# Patient Record
Sex: Female | Born: 1950
Health system: Southern US, Community
[De-identification: ages and names within clinical notes are randomized; demographics above are authoritative.]

## PROBLEM LIST (undated history)

## (undated) DIAGNOSIS — G9050 Complex regional pain syndrome I, unspecified: Secondary | ICD-10-CM

## (undated) DIAGNOSIS — Z803 Family history of malignant neoplasm of breast: Secondary | ICD-10-CM

## (undated) DIAGNOSIS — Z800 Family history of malignant neoplasm of digestive organs: Secondary | ICD-10-CM

## (undated) DIAGNOSIS — T7840XA Allergy, unspecified, initial encounter: Secondary | ICD-10-CM

## (undated) DIAGNOSIS — IMO0002 Reserved for concepts with insufficient information to code with codable children: Secondary | ICD-10-CM

## (undated) DIAGNOSIS — M1990 Unspecified osteoarthritis, unspecified site: Secondary | ICD-10-CM

## (undated) DIAGNOSIS — I10 Essential (primary) hypertension: Secondary | ICD-10-CM

## (undated) DIAGNOSIS — Z8041 Family history of malignant neoplasm of ovary: Secondary | ICD-10-CM

## (undated) DIAGNOSIS — K317 Polyp of stomach and duodenum: Secondary | ICD-10-CM

## (undated) DIAGNOSIS — E739 Lactose intolerance, unspecified: Secondary | ICD-10-CM

## (undated) DIAGNOSIS — E119 Type 2 diabetes mellitus without complications: Secondary | ICD-10-CM

## (undated) DIAGNOSIS — E039 Hypothyroidism, unspecified: Secondary | ICD-10-CM

## (undated) DIAGNOSIS — K219 Gastro-esophageal reflux disease without esophagitis: Secondary | ICD-10-CM

## (undated) DIAGNOSIS — N200 Calculus of kidney: Secondary | ICD-10-CM

## (undated) DIAGNOSIS — Z1371 Encounter for nonprocreative screening for genetic disease carrier status: Secondary | ICD-10-CM

## (undated) DIAGNOSIS — K449 Diaphragmatic hernia without obstruction or gangrene: Secondary | ICD-10-CM

## (undated) DIAGNOSIS — K3184 Gastroparesis: Secondary | ICD-10-CM

## (undated) DIAGNOSIS — Q359 Cleft palate, unspecified: Secondary | ICD-10-CM

## (undated) DIAGNOSIS — G5600 Carpal tunnel syndrome, unspecified upper limb: Secondary | ICD-10-CM

## (undated) DIAGNOSIS — K589 Irritable bowel syndrome without diarrhea: Secondary | ICD-10-CM

## (undated) HISTORY — DX: Family history of malignant neoplasm of breast: Z80.3

## (undated) HISTORY — DX: Encounter for nonprocreative screening for genetic disease carrier status: Z13.71

## (undated) HISTORY — DX: Cleft palate, unspecified: Q35.9

## (undated) HISTORY — DX: Family history of malignant neoplasm of digestive organs: Z80.0

## (undated) HISTORY — DX: Diaphragmatic hernia without obstruction or gangrene: K44.9

## (undated) HISTORY — DX: Lactose intolerance, unspecified: E73.9

## (undated) HISTORY — DX: Carpal tunnel syndrome, unspecified upper limb: G56.00

## (undated) HISTORY — DX: Calculus of kidney: N20.0

## (undated) HISTORY — DX: Allergy, unspecified, initial encounter: T78.40XA

## (undated) HISTORY — DX: Complex regional pain syndrome I, unspecified: G90.50

## (undated) HISTORY — DX: Irritable bowel syndrome, unspecified: K58.9

## (undated) HISTORY — DX: Unspecified osteoarthritis, unspecified site: M19.90

## (undated) HISTORY — DX: Reserved for concepts with insufficient information to code with codable children: IMO0002

## (undated) HISTORY — DX: Family history of malignant neoplasm of ovary: Z80.41

## (undated) HISTORY — PX: BREAST EXCISIONAL BIOPSY: SUR124

## (undated) HISTORY — PX: INCISIONAL BREAST BIOPSY: SHX1812

## (undated) HISTORY — DX: Polyp of stomach and duodenum: K31.7

## (undated) HISTORY — PX: COLONOSCOPY: SHX174

## (undated) HISTORY — DX: Gastro-esophageal reflux disease without esophagitis: K21.9

---

## 1974-05-05 HISTORY — PX: KNEE SURGERY: SHX244

## 1985-05-05 HISTORY — PX: TUBAL LIGATION: SHX77

## 1998-09-03 ENCOUNTER — Other Ambulatory Visit: Admission: RE | Admit: 1998-09-03 | Discharge: 1998-09-03 | Payer: Self-pay | Admitting: Obstetrics and Gynecology

## 1998-09-05 ENCOUNTER — Other Ambulatory Visit: Admission: RE | Admit: 1998-09-05 | Discharge: 1998-09-05 | Payer: Self-pay | Admitting: Obstetrics and Gynecology

## 1998-12-25 ENCOUNTER — Encounter: Admission: RE | Admit: 1998-12-25 | Discharge: 1999-03-25 | Payer: Self-pay | Admitting: Obstetrics & Gynecology

## 1999-09-26 ENCOUNTER — Encounter: Payer: Self-pay | Admitting: Obstetrics and Gynecology

## 1999-09-26 ENCOUNTER — Encounter: Admission: RE | Admit: 1999-09-26 | Discharge: 1999-09-26 | Payer: Self-pay | Admitting: Obstetrics and Gynecology

## 1999-12-10 ENCOUNTER — Other Ambulatory Visit: Admission: RE | Admit: 1999-12-10 | Discharge: 1999-12-10 | Payer: Self-pay | Admitting: Obstetrics and Gynecology

## 1999-12-25 ENCOUNTER — Encounter: Admission: RE | Admit: 1999-12-25 | Discharge: 2000-02-11 | Payer: Self-pay | Admitting: Family Medicine

## 2000-01-27 ENCOUNTER — Encounter: Payer: Self-pay | Admitting: Obstetrics and Gynecology

## 2000-01-27 ENCOUNTER — Ambulatory Visit (HOSPITAL_COMMUNITY): Admission: RE | Admit: 2000-01-27 | Discharge: 2000-01-27 | Payer: Self-pay | Admitting: Obstetrics and Gynecology

## 2000-05-22 ENCOUNTER — Other Ambulatory Visit: Admission: RE | Admit: 2000-05-22 | Discharge: 2000-05-22 | Payer: Self-pay | Admitting: Obstetrics and Gynecology

## 2000-10-16 ENCOUNTER — Encounter: Payer: Self-pay | Admitting: Obstetrics and Gynecology

## 2000-10-16 ENCOUNTER — Encounter: Admission: RE | Admit: 2000-10-16 | Discharge: 2000-10-16 | Payer: Self-pay | Admitting: Obstetrics and Gynecology

## 2001-03-12 ENCOUNTER — Encounter: Payer: Self-pay | Admitting: Obstetrics and Gynecology

## 2001-03-12 ENCOUNTER — Encounter: Admission: RE | Admit: 2001-03-12 | Discharge: 2001-03-12 | Payer: Self-pay | Admitting: Obstetrics and Gynecology

## 2001-04-14 ENCOUNTER — Other Ambulatory Visit: Admission: RE | Admit: 2001-04-14 | Discharge: 2001-04-14 | Payer: Self-pay | Admitting: Obstetrics and Gynecology

## 2001-05-14 ENCOUNTER — Ambulatory Visit (HOSPITAL_BASED_OUTPATIENT_CLINIC_OR_DEPARTMENT_OTHER): Admission: RE | Admit: 2001-05-14 | Discharge: 2001-05-14 | Payer: Self-pay | Admitting: *Deleted

## 2001-05-14 ENCOUNTER — Encounter (INDEPENDENT_AMBULATORY_CARE_PROVIDER_SITE_OTHER): Payer: Self-pay | Admitting: Specialist

## 2001-11-26 ENCOUNTER — Encounter: Payer: Self-pay | Admitting: Obstetrics and Gynecology

## 2001-11-26 ENCOUNTER — Encounter: Admission: RE | Admit: 2001-11-26 | Discharge: 2001-11-26 | Payer: Self-pay | Admitting: Obstetrics and Gynecology

## 2002-06-16 ENCOUNTER — Other Ambulatory Visit: Admission: RE | Admit: 2002-06-16 | Discharge: 2002-06-16 | Payer: Self-pay | Admitting: Obstetrics and Gynecology

## 2002-09-05 ENCOUNTER — Encounter: Payer: Self-pay | Admitting: Gastroenterology

## 2003-05-08 ENCOUNTER — Encounter: Admission: RE | Admit: 2003-05-08 | Discharge: 2003-05-08 | Payer: Self-pay | Admitting: Obstetrics and Gynecology

## 2003-07-03 ENCOUNTER — Other Ambulatory Visit: Admission: RE | Admit: 2003-07-03 | Discharge: 2003-07-03 | Payer: Self-pay | Admitting: Obstetrics and Gynecology

## 2004-01-04 ENCOUNTER — Encounter: Admission: RE | Admit: 2004-01-04 | Discharge: 2004-01-04 | Payer: Self-pay | Admitting: Obstetrics and Gynecology

## 2004-07-03 ENCOUNTER — Other Ambulatory Visit: Admission: RE | Admit: 2004-07-03 | Discharge: 2004-07-03 | Payer: Self-pay | Admitting: Obstetrics and Gynecology

## 2004-09-19 ENCOUNTER — Ambulatory Visit: Payer: Self-pay | Admitting: Family Medicine

## 2004-10-24 ENCOUNTER — Ambulatory Visit: Payer: Self-pay | Admitting: Family Medicine

## 2005-02-27 ENCOUNTER — Encounter: Admission: RE | Admit: 2005-02-27 | Discharge: 2005-02-27 | Payer: Self-pay | Admitting: Obstetrics and Gynecology

## 2005-07-03 ENCOUNTER — Other Ambulatory Visit: Admission: RE | Admit: 2005-07-03 | Discharge: 2005-07-03 | Payer: Self-pay | Admitting: Obstetrics and Gynecology

## 2006-07-24 ENCOUNTER — Encounter: Admission: RE | Admit: 2006-07-24 | Discharge: 2006-07-24 | Payer: Self-pay | Admitting: Obstetrics and Gynecology

## 2006-12-04 ENCOUNTER — Other Ambulatory Visit: Admission: RE | Admit: 2006-12-04 | Discharge: 2006-12-04 | Payer: Self-pay | Admitting: Obstetrics and Gynecology

## 2007-11-26 ENCOUNTER — Encounter: Admission: RE | Admit: 2007-11-26 | Discharge: 2007-11-26 | Payer: Self-pay | Admitting: Obstetrics and Gynecology

## 2007-12-09 ENCOUNTER — Other Ambulatory Visit: Admission: RE | Admit: 2007-12-09 | Discharge: 2007-12-09 | Payer: Self-pay | Admitting: Obstetrics and Gynecology

## 2007-12-23 DIAGNOSIS — K589 Irritable bowel syndrome without diarrhea: Secondary | ICD-10-CM

## 2007-12-23 DIAGNOSIS — D131 Benign neoplasm of stomach: Secondary | ICD-10-CM

## 2007-12-23 DIAGNOSIS — E739 Lactose intolerance, unspecified: Secondary | ICD-10-CM

## 2008-01-18 ENCOUNTER — Ambulatory Visit: Payer: Self-pay | Admitting: Gastroenterology

## 2008-01-18 DIAGNOSIS — K219 Gastro-esophageal reflux disease without esophagitis: Secondary | ICD-10-CM

## 2008-01-18 DIAGNOSIS — R112 Nausea with vomiting, unspecified: Secondary | ICD-10-CM

## 2008-01-18 DIAGNOSIS — G9050 Complex regional pain syndrome I, unspecified: Secondary | ICD-10-CM | POA: Insufficient documentation

## 2008-01-18 DIAGNOSIS — M129 Arthropathy, unspecified: Secondary | ICD-10-CM | POA: Insufficient documentation

## 2008-01-18 LAB — CONVERTED CEMR LAB
ALT: 25 units/L (ref 0–35)
Albumin: 4.2 g/dL (ref 3.5–5.2)
Alkaline Phosphatase: 85 units/L (ref 39–117)
Basophils Absolute: 0 10*3/uL (ref 0.0–0.1)
CO2: 30 meq/L (ref 19–32)
GFR calc Af Amer: 84 mL/min
GFR calc non Af Amer: 69 mL/min
Glucose, Bld: 92 mg/dL (ref 70–99)
HCT: 41.1 % (ref 36.0–46.0)
Hemoglobin: 14.3 g/dL (ref 12.0–15.0)
Lymphocytes Relative: 34.6 % (ref 12.0–46.0)
MCHC: 34.8 g/dL (ref 30.0–36.0)
Platelets: 220 10*3/uL (ref 150–400)
Potassium: 4.2 meq/L (ref 3.5–5.1)
RBC: 4.5 M/uL (ref 3.87–5.11)
Saturation Ratios: 18.7 % — ABNORMAL LOW (ref 20.0–50.0)
TSH: 1.35 microintl units/mL (ref 0.35–5.50)
Total Protein: 6.8 g/dL (ref 6.0–8.3)
Transferrin: 248.6 mg/dL (ref >=212.0)
WBC: 6.2 10*3/uL (ref 4.5–10.5)

## 2008-01-28 ENCOUNTER — Ambulatory Visit (HOSPITAL_COMMUNITY): Admission: RE | Admit: 2008-01-28 | Discharge: 2008-01-28 | Payer: Self-pay | Admitting: Gastroenterology

## 2008-02-07 ENCOUNTER — Ambulatory Visit: Payer: Self-pay | Admitting: Gastroenterology

## 2008-02-07 LAB — HM COLONOSCOPY

## 2008-02-08 LAB — CONVERTED CEMR LAB: UREASE: NEGATIVE

## 2008-05-15 ENCOUNTER — Ambulatory Visit: Payer: Self-pay | Admitting: Internal Medicine

## 2008-05-15 DIAGNOSIS — J069 Acute upper respiratory infection, unspecified: Secondary | ICD-10-CM | POA: Insufficient documentation

## 2008-12-15 ENCOUNTER — Other Ambulatory Visit: Admission: RE | Admit: 2008-12-15 | Discharge: 2008-12-15 | Payer: Self-pay | Admitting: Obstetrics and Gynecology

## 2009-02-05 ENCOUNTER — Ambulatory Visit: Payer: Self-pay | Admitting: Internal Medicine

## 2009-02-05 LAB — CONVERTED CEMR LAB
AST: 18 units/L (ref 0–37)
Albumin: 4.2 g/dL (ref 3.5–5.2)
Alkaline Phosphatase: 83 units/L (ref 39–117)
Basophils Relative: 0.8 % (ref 0.0–3.0)
Bilirubin, Direct: 0 mg/dL (ref 0.0–0.3)
CO2: 27 meq/L (ref 19–32)
Chloride: 112 meq/L (ref 96–112)
Cholesterol: 219 mg/dL — ABNORMAL HIGH (ref 0–200)
Eosinophils Relative: 1.8 % (ref 0.0–5.0)
GFR calc non Af Amer: 60.75 mL/min (ref >60)
Glucose, Bld: 109 mg/dL — ABNORMAL HIGH (ref 70–99)
HCT: 40.8 % (ref 36.0–46.0)
Lymphs Abs: 1.3 10*3/uL (ref 0.7–4.0)
MCV: 92.8 fL (ref 78.0–100.0)
Monocytes Relative: 6.1 % (ref 3.0–12.0)
Neutrophils Relative %: 66.7 % (ref 43.0–77.0)
Platelets: 198 10*3/uL (ref 150.0–400.0)
RDW: 12.4 % (ref 11.5–14.6)
Sodium: 143 meq/L (ref 135–145)
Total Bilirubin: 0.7 mg/dL (ref 0.3–1.2)
Total Protein: 7.1 g/dL (ref 6.0–8.3)

## 2009-03-13 ENCOUNTER — Encounter (INDEPENDENT_AMBULATORY_CARE_PROVIDER_SITE_OTHER): Payer: Self-pay | Admitting: *Deleted

## 2009-06-13 ENCOUNTER — Encounter: Admission: RE | Admit: 2009-06-13 | Discharge: 2009-06-13 | Payer: Self-pay | Admitting: Obstetrics and Gynecology

## 2009-07-04 ENCOUNTER — Telehealth: Payer: Self-pay

## 2009-07-18 ENCOUNTER — Encounter: Admission: RE | Admit: 2009-07-18 | Discharge: 2009-07-18 | Payer: Self-pay | Admitting: Obstetrics and Gynecology

## 2009-08-27 ENCOUNTER — Ambulatory Visit: Payer: Self-pay | Admitting: Internal Medicine

## 2009-08-27 ENCOUNTER — Telehealth: Payer: Self-pay | Admitting: Internal Medicine

## 2009-09-04 ENCOUNTER — Ambulatory Visit: Payer: Self-pay | Admitting: Gastroenterology

## 2009-09-04 DIAGNOSIS — R1031 Right lower quadrant pain: Secondary | ICD-10-CM | POA: Insufficient documentation

## 2009-09-06 ENCOUNTER — Ambulatory Visit (HOSPITAL_COMMUNITY): Admission: RE | Admit: 2009-09-06 | Discharge: 2009-09-06 | Payer: Self-pay | Admitting: Gastroenterology

## 2009-09-07 ENCOUNTER — Ambulatory Visit (HOSPITAL_COMMUNITY): Admission: RE | Admit: 2009-09-07 | Discharge: 2009-09-07 | Payer: Self-pay | Admitting: Gastroenterology

## 2009-09-07 ENCOUNTER — Ambulatory Visit: Payer: Self-pay | Admitting: Gastroenterology

## 2009-09-07 ENCOUNTER — Ambulatory Visit: Payer: Self-pay | Admitting: Internal Medicine

## 2009-09-07 ENCOUNTER — Telehealth (INDEPENDENT_AMBULATORY_CARE_PROVIDER_SITE_OTHER): Payer: Self-pay | Admitting: *Deleted

## 2009-09-07 DIAGNOSIS — R1013 Epigastric pain: Secondary | ICD-10-CM

## 2009-09-07 DIAGNOSIS — R1011 Right upper quadrant pain: Secondary | ICD-10-CM

## 2009-09-12 ENCOUNTER — Telehealth: Payer: Self-pay | Admitting: Internal Medicine

## 2009-09-12 DIAGNOSIS — E538 Deficiency of other specified B group vitamins: Secondary | ICD-10-CM

## 2009-09-12 LAB — CONVERTED CEMR LAB
ALT: 22 units/L (ref 0–35)
AST: 20 units/L (ref 0–37)
Albumin: 4.1 g/dL (ref 3.5–5.2)
Alkaline Phosphatase: 89 units/L (ref 39–117)
Basophils Absolute: 0 10*3/uL (ref 0.0–0.1)
Basophils Relative: 0.4 % (ref 0.0–3.0)
CO2: 30 meq/L (ref 19–32)
Eosinophils Absolute: 0.1 10*3/uL (ref 0.0–0.7)
Glucose, Bld: 114 mg/dL — ABNORMAL HIGH (ref 70–99)
HCT: 40.6 % (ref 36.0–46.0)
MCHC: 35 g/dL (ref 30.0–36.0)
MCV: 90.7 fL (ref 78.0–100.0)
Monocytes Absolute: 0.4 10*3/uL (ref 0.1–1.0)
Neutro Abs: 4.1 10*3/uL (ref 1.4–7.7)
Potassium: 3.9 meq/L (ref 3.5–5.1)
RBC: 4.47 M/uL (ref 3.87–5.11)
Saturation Ratios: 16.7 % — ABNORMAL LOW (ref 20.0–50.0)
TSH: 2 microintl units/mL (ref 0.35–5.50)
Total Protein: 6.2 g/dL (ref 6.0–8.3)
WBC: 6.4 10*3/uL (ref 4.5–10.5)

## 2009-09-13 ENCOUNTER — Ambulatory Visit: Payer: Self-pay | Admitting: Internal Medicine

## 2009-09-20 ENCOUNTER — Ambulatory Visit: Payer: Self-pay | Admitting: Internal Medicine

## 2009-09-24 ENCOUNTER — Ambulatory Visit (HOSPITAL_COMMUNITY): Admission: RE | Admit: 2009-09-24 | Discharge: 2009-09-24 | Payer: Self-pay | Admitting: Gastroenterology

## 2009-09-26 ENCOUNTER — Ambulatory Visit: Payer: Self-pay | Admitting: Internal Medicine

## 2009-09-26 ENCOUNTER — Telehealth (INDEPENDENT_AMBULATORY_CARE_PROVIDER_SITE_OTHER): Payer: Self-pay | Admitting: *Deleted

## 2009-11-09 ENCOUNTER — Encounter: Admission: RE | Admit: 2009-11-09 | Discharge: 2009-11-09 | Payer: Self-pay | Admitting: General Surgery

## 2010-01-22 IMAGING — US US ABDOMEN COMPLETE
1 series · 14 of 25 positions shown · non-contrast
Comparison: None

CLINICAL DATA: pain, vomiting and diarrhea

ABDOMEN ULTRASOUND
TECHNIQUE: Complete abdominal ultrasound examination was performed
including evaluation of the liver, gallbladder, bile ducts,
pancreas, kidneys, spleen, IVC, and abdominal aorta.

[Series 1: unknown · 0.33mm/px · 14 of 105 slices shown]
[im 1/105]
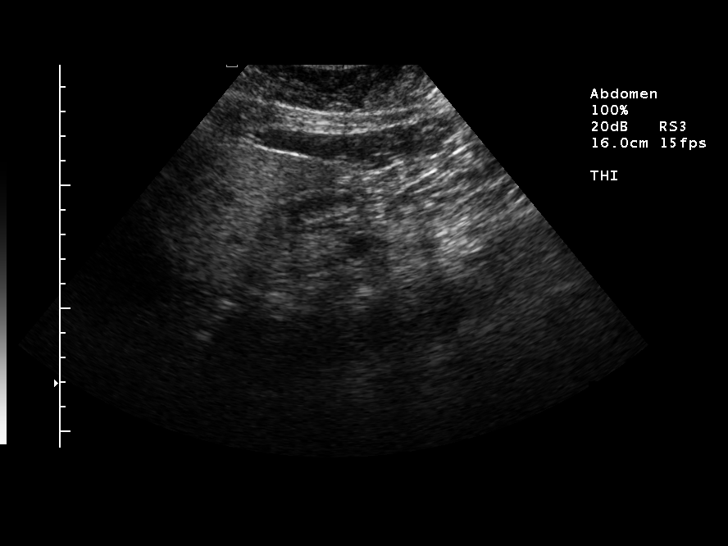
[im 9/105]
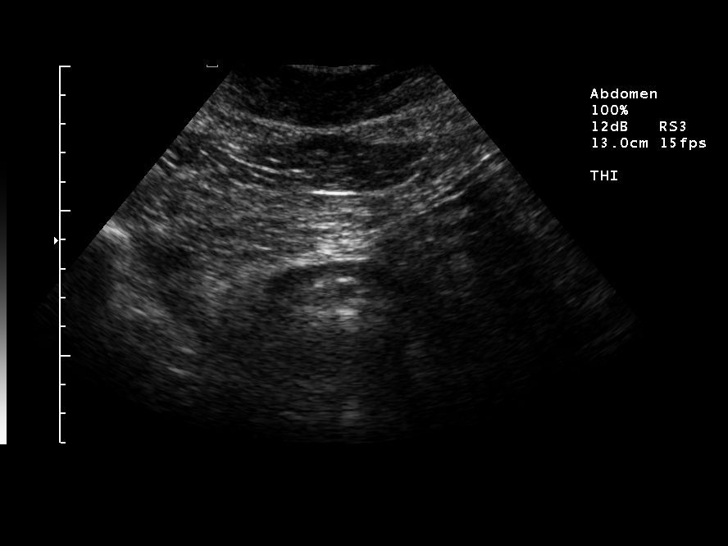
[im 18/105]
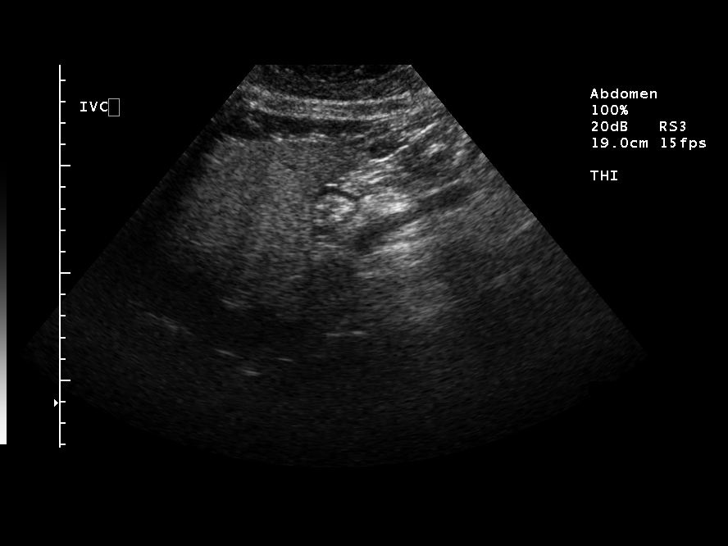
[im 27/105]
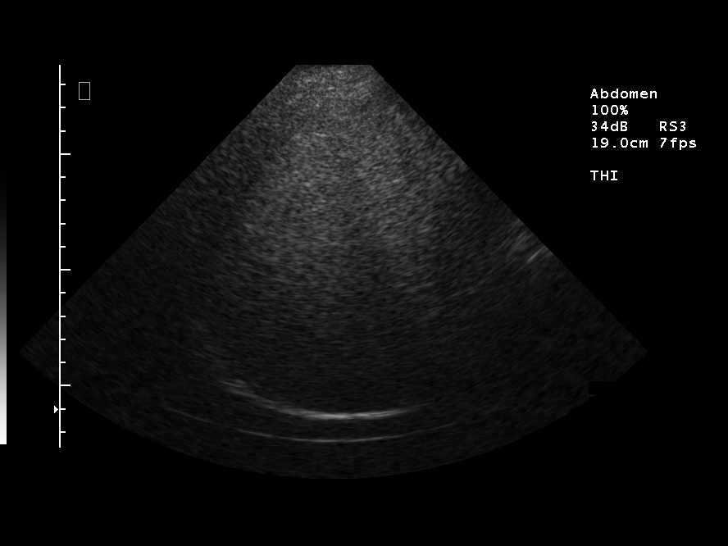
[im 35/105]
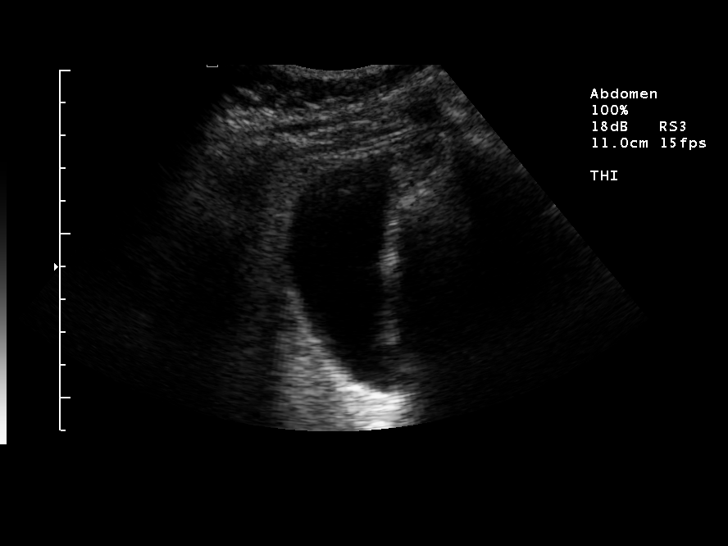
[im 40/105]
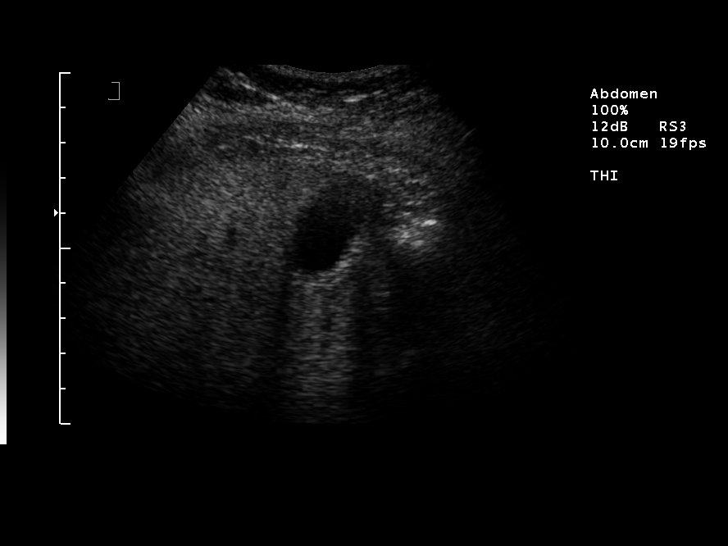
[im 48/105]
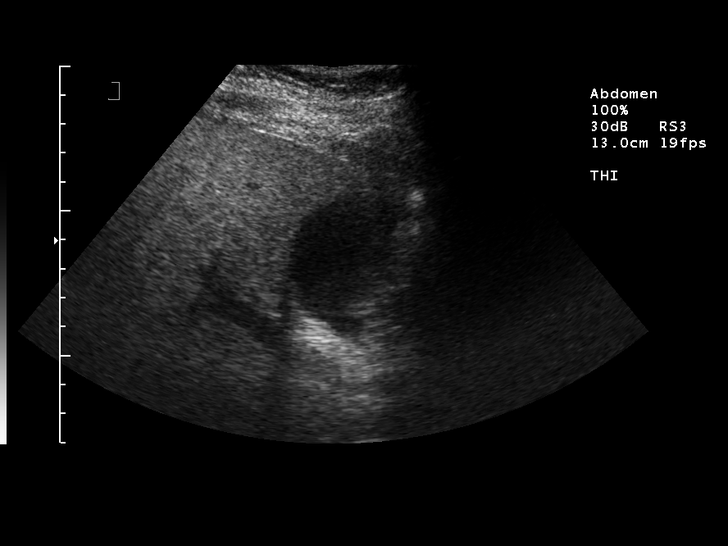
[im 57/105]
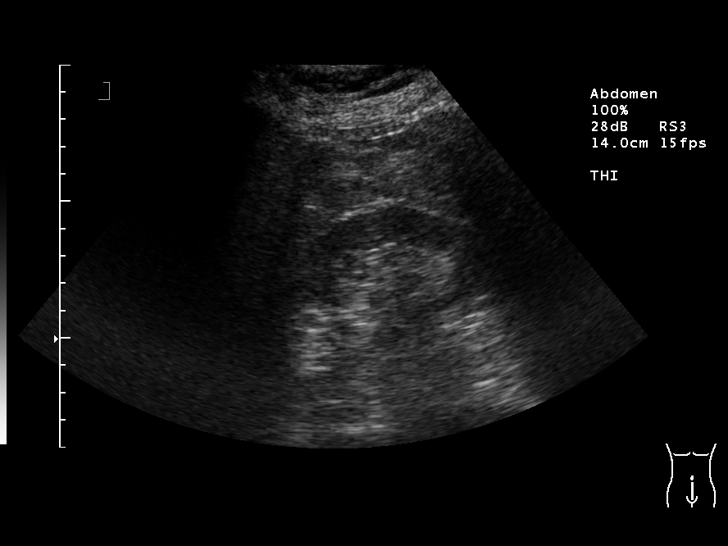
[im 66/105]
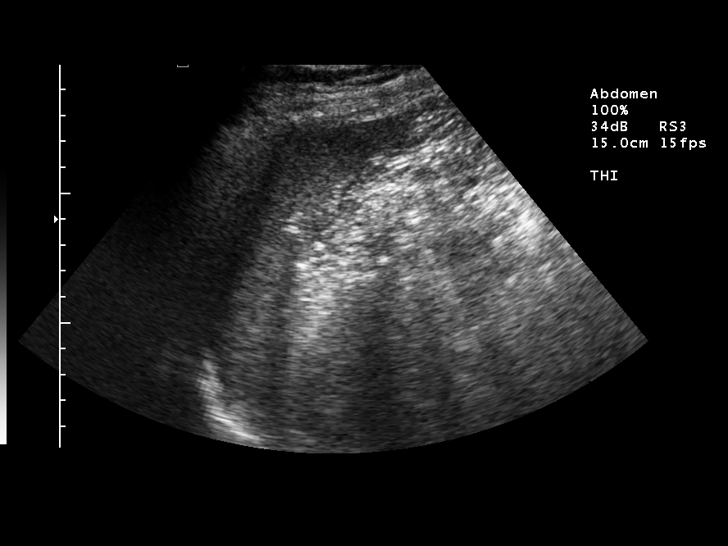
[im 70/105]
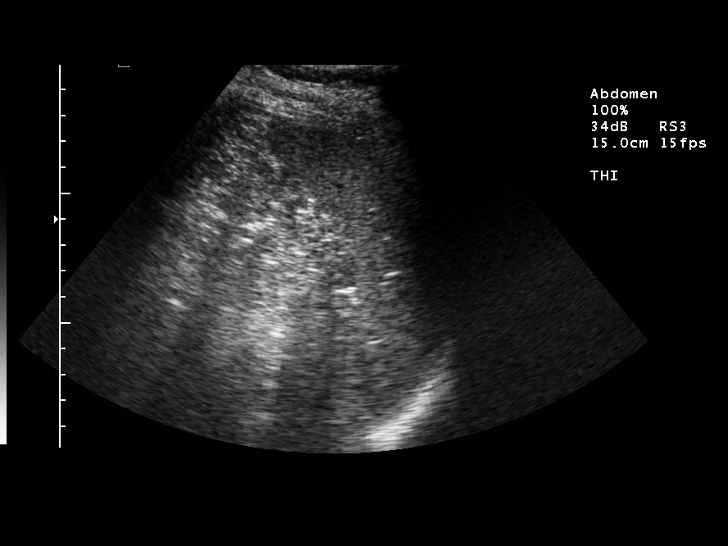
[im 79/105]
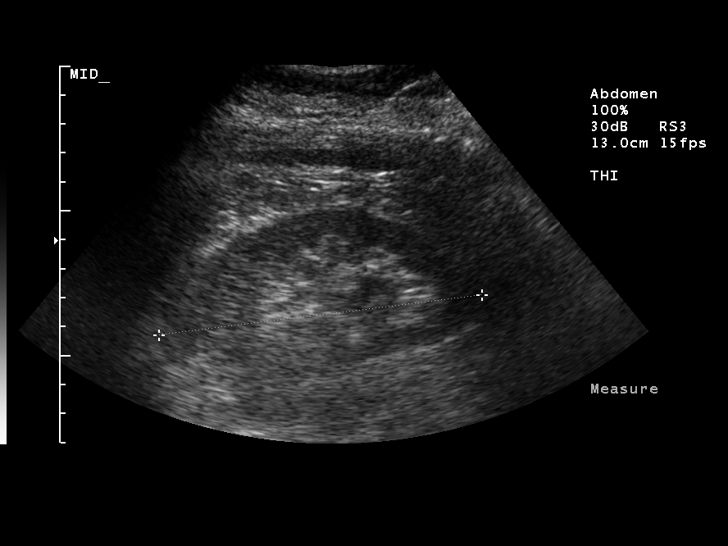
[im 87/105]
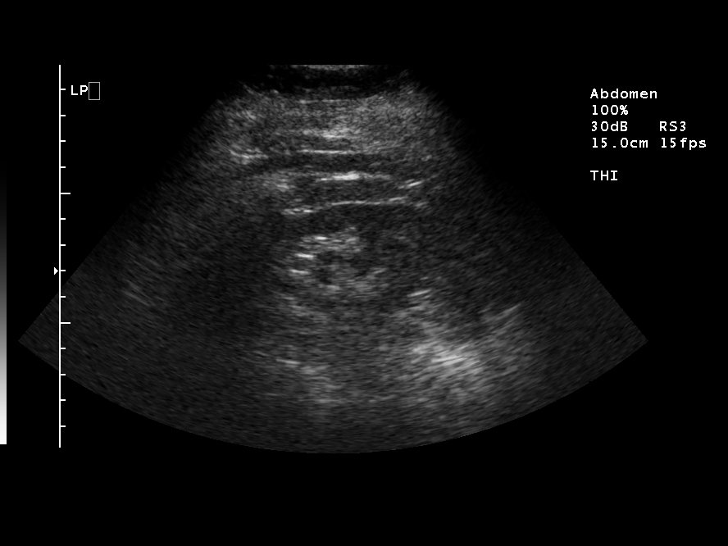
[im 96/105]
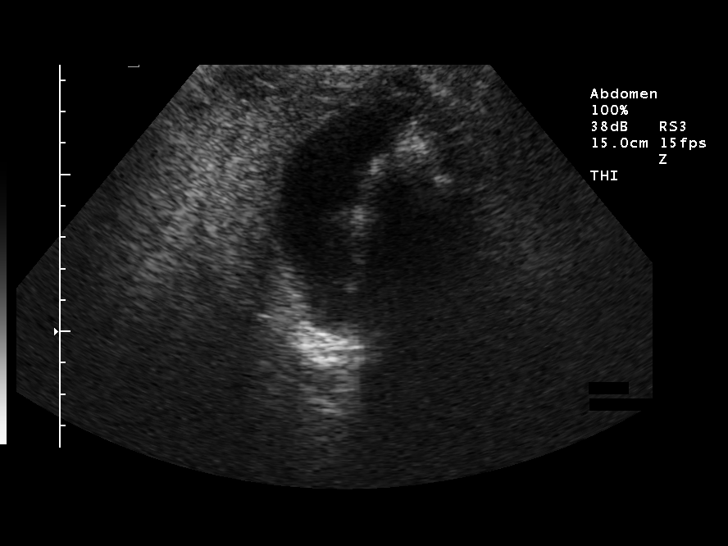
[im 105/105]
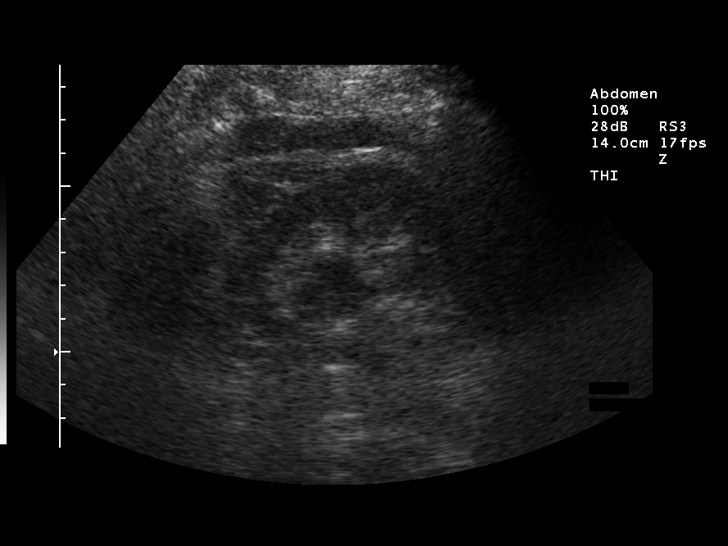

[14 of 25 positions shown; findings below may reference images not displayed]

FINDINGS: The liver is normal in contour and echogenicity.  The
gallbladder is thin-walled at 2.8 mm.  No evidence of
pericholecystic fluid.  No evidence of gallstones.  Negative
sonographic Murphy's sign.  Common bile duct is normal diameter
mm.

The IVC and pancreas appear normal.

The spleen is normal echogenicity measuring 11.7 cm.

Right kidney measures 10.0 cm.  Left kidney measures 11.2 cm. No
evidence of hydronephrosis.

Abdominal aorta is normal in diameter 2.0 cm.
IMPRESSION: 1..  No acute abdominal process identified.

## 2010-02-15 ENCOUNTER — Ambulatory Visit: Payer: Self-pay | Admitting: Internal Medicine

## 2010-02-15 LAB — CONVERTED CEMR LAB
ALT: 19 units/L (ref 0–35)
AST: 18 units/L (ref 0–37)
Alkaline Phosphatase: 87 units/L (ref 39–117)
BUN: 12 mg/dL (ref 6–23)
Bilirubin, Direct: 0.1 mg/dL (ref 0.0–0.3)
CO2: 29 meq/L (ref 19–32)
Eosinophils Absolute: 0.1 10*3/uL (ref 0.0–0.7)
Eosinophils Relative: 1.5 % (ref 0.0–5.0)
GFR calc non Af Amer: 68.35 mL/min (ref >60)
Glucose, Bld: 109 mg/dL — ABNORMAL HIGH (ref 70–99)
HCT: 42.8 % (ref 36.0–46.0)
Ketones, urine, test strip: NEGATIVE
MCV: 91.8 fL (ref 78.0–100.0)
Platelets: 197 10*3/uL (ref 150.0–400.0)
Protein, U semiquant: NEGATIVE
RDW: 13.3 % (ref 11.5–14.6)
Sodium: 141 meq/L (ref 135–145)
Total Bilirubin: 0.6 mg/dL (ref 0.3–1.2)
Total Protein: 6.9 g/dL (ref 6.0–8.3)
Triglycerides: 191 mg/dL — ABNORMAL HIGH (ref 0.0–149.0)
Urobilinogen, UA: 0.2
VLDL: 38.2 mg/dL (ref 0.0–40.0)
WBC Urine, dipstick: NEGATIVE
pH: 7

## 2010-03-01 ENCOUNTER — Ambulatory Visit: Payer: Self-pay | Admitting: Internal Medicine

## 2010-05-26 ENCOUNTER — Encounter: Payer: Self-pay | Admitting: General Surgery

## 2010-06-04 NOTE — Assessment & Plan Note (Signed)
Summary: Nausea X 2 weeks/lk   History of Present Illness Visit Type: Follow-up Visit Primary GI MD: Sheryn Bison MD FACP FAGA Primary Provider: Eleonore Chiquito, MD  Requesting Provider: n/a Chief Complaint: Nausea and vomiting x 3 weeks  History of Present Illness:   60 year old Caucasian female with previous extensive negative GI evaluation several years ago including endoscopy and colonoscopy. She has chronic pain syndrome from RSD syndrome in her left leg and is on a variety of opiates. She now complains of intermittent cycles of nausea and vomiting, mid abdominal pain, followed by several days of diarrhea. After these are resolved she has abdominal gas and bloating and mild constipation. She denies any specific hepatobiliary complaints, rectal bleeding, fever, chills, her general medical problems otherwise except for chronic pain syndrome. She is chronically Bentyl Nexium 40 mg a day and is now on pantoprazole 40 mg a day. She does have chronic gastroesophageal reflux which is fairly well managed.  Review of previous colonoscopy shows a long and redundant colon with suspected intermittent cecal volvulus. She is on multiple medications listed and reviewed her chart.   GI Review of Systems    Reports nausea and  vomiting.      Denies abdominal pain, acid reflux, belching, bloating, chest pain, dysphagia with liquids, dysphagia with solids, heartburn, loss of appetite, vomiting blood, weight loss, and  weight gain.        Denies anal fissure, black tarry stools, change in bowel habit, constipation, diarrhea, diverticulosis, fecal incontinence, heme positive stool, hemorrhoids, irritable bowel syndrome, jaundice, light color stool, liver problems, rectal bleeding, and  rectal pain.    Current Medications (verified): 1)  Lunesta 3 Mg Tabs (Eszopiclone) .... Take 1 Tab By Mouth At Bedtime 2)  Opana 5 Mg Tabs (Oxymorphone Hcl) .... Take 1 Tablet By Mouth As Needed 3)  Cymbalta 60 Mg Cpep  (Duloxetine Hcl) .... Take 1 Tablet By Mouth Once A Day 4)  Clonidine Hcl 0.3 Mg Tabs (Clonidine Hcl) .... Take 1 Tablet By Mouth Three Times A Day 5)  Vitamin D 2000 Unit Tabs (Cholecalciferol) .... Take 2  Tablet By By Mouth Two Times A Day 6)  Adult Aspirin Ec Low Strength 81 Mg Tbec (Aspirin) .... Take 1 Tablet By Mouth Once A Day 7)  Fish Oil 300 Mg Caps (Omega-3 Fatty Acids) .... Take 1 Tablet By Mouth Two Times A Day 8)  Calcium 1500 Mg Tabs (Calcium Carbonate) .... Take 1 Tablet By Mouth Once A Day 9)  Lidoderm 5 % Ptch (Lidocaine) .... Use As Needed 10)  Topamax 25 Mg Tabs (Topiramate) .... One Tablet By Mouth Once Daily 11)  Promethazine Hcl 50 Mg Supp (Promethazine Hcl) .... Use Every 6 Hours As Needed For Nausea 12)  Pantoprazole Sodium 40 Mg Tbec (Pantoprazole Sodium) .... One Daily 13)  Promethazine Hcl 25 Mg Supp (Promethazine Hcl) .... Q4 Hr Per Rectum As Needed Nausea and Vomiting 14)  Opana Er 15 Mg Xr12h-Tab (Oxymorphone Hcl) .... One Tablet By Mouth Two Times A Day  Allergies (verified): No Known Drug Allergies  Past History:  Past medical, surgical, family and social histories (including risk factors) reviewed for relevance to current acute and chronic problems.  Past Medical History: Reviewed history from 08/27/2009 and no changes required. Current Problems:  REFLEX SYMPATHETIC DYSTROPHY (ICD-337.20) ARTHRITIS (ICD-716.90) LACTOSE INTOLERANCE (ICD-271.3) IRRITABLE BOWEL SYNDROME (ICD-564.1) EXTERNAL HEMORRHOIDS (ICD-455.3) GASTRIC POLYP (ICD-211.1) GERD  Past Surgical History: Reviewed history from 02/05/2009 and no changes required. knee surgery Tubal Ligation  Breast surgery cluster cyst removed X2 Sinus surgery 1984 history of cleft palate repaired at [facility]  colonoscopy 2004  Family History: Reviewed history from 02/05/2009 and no changes required. Family History of Breast Cancer:  sister-age 45 Family History of Ovarian Cancer:  Mother Family History of Colon Cancer: aunt Family History of Heart Disease: MGF Family History of Irritable Bowel Syndrome: daughter Family History of Kidney Disease: mother Lung Cancer: Father, Age 64, COPD Mother: CAD, solitary kidney,osteoporosis  4 sisters, 1 brother  Social History: Reviewed history from 01/18/2008 and no changes required. 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  Alcohol Use - no Illicit Drug Use - no  Review of Systems  The patient denies allergy/sinus, anemia, anxiety-new, arthritis/joint pain, back pain, blood in urine, breast changes/lumps, change in vision, confusion, cough, coughing up blood, depression-new, fainting, fatigue, fever, headaches-new, hearing problems, heart murmur, heart rhythm changes, itching, menstrual pain, muscle pains/cramps, night sweats, nosebleeds, pregnancy symptoms, shortness of breath, skin rash, sleeping problems, sore throat, swelling of feet/legs, swollen lymph glands, thirst - excessive , urination - excessive , urination changes/pain, urine leakage, vision changes, and voice change.    Vital Signs:  Patient profile:   60 year old female Height:      68 inches Weight:      226 pounds BMI:     34.49 BSA:     2.15 Pulse rate:   88 / minute Pulse rhythm:   regular BP sitting:   132 / 76  (left arm) Cuff size:   regular  Vitals Entered By: Ok Anis CMA (Sep 04, 2009 2:46 PM)  Physical Exam  General:  Well developed, well nourished, no acute distress.healthy appearing.   Head:  Normocephalic and atraumatic. Eyes:  PERRLA, no icterus.exam deferred to patient's ophthalmologist.   Lungs:  Clear throughout to auscultation. Heart:  Regular rate and rhythm; no murmurs, rubs,  or bruits. Abdomen:  Soft, nontender and nondistended. No masses, hepatosplenomegaly or hernias noted. Normal bowel sounds.obese.   Neurologic:  Alert and  oriented x4;  grossly normal neurologically. Inguinal Nodes:  No  significant inguinal adenopathy. Psych:  Alert and cooperative. Normal mood and affect.   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 I have ordered screening labs and full column barium enema exam. She may need abdominal-pelvic CT scanning. She is to continue all of her current medications until her workup has been completed. Orders: TLB-CBC Platelet - w/Differential (85025-CBCD) TLB-BMP (Basic Metabolic Panel-BMET) (80048-METABOL) TLB-Hepatic/Liver Function Pnl (80076-HEPATIC) TLB-TSH (Thyroid Stimulating Hormone) (84443-TSH) TLB-B12, Serum-Total ONLY (16109-U04) TLB-Ferritin (82728-FER) TLB-Folic Acid (Folate) (82746-FOL) TLB-IBC Pnl (Iron/FE;Transferrin) (83550-IBC) TLB-Sedimentation Rate (ESR) (85652-ESR)  Problem # 2:  GERD (ICD-530.81) Assessment: Improved continue reflex regime and daily PPI therapy.  Problem # 3:  REFLEX SYMPATHETIC DYSTROPHY (ICD-337.20) Assessment: Unchanged  Patient Instructions: 1)  Please go to the basement for lab work. 2)  You are scheduled for a barium Enema. 3)  Please continue current medications.  4)  The medication list was reviewed and reconciled.  All changed / newly prescribed medications were explained.  A complete medication list was provided to the patient / caregiver. 5)  Copy sent to : Dr. Beverely Low 6)  Please continue current medications.   Appended Document: Nausea X 2 weeks/lk    Clinical Lists Changes  Orders:  Added new Test order of Barium Enema (BE) - Signed

## 2010-06-04 NOTE — Assessment & Plan Note (Signed)
Summary: b12 inj/njr  Nurse Visit   Allergies: No Known Drug Allergies  Medication Administration  Injection # 1:    Medication: Vit B12 1000 mcg    Diagnosis: B12 DEFICIENCY (ICD-266.2)    Route: IM    Site: R deltoid    Exp Date: 01/2011    Lot #: [id number]    Mfr: [employer]    Patient tolerated injection without complications    Given by: Duard Brady LPN (Sep 26, 2009 11:21 AM)  Orders Added: 1)  Vit B12 1000 mcg [J3420] 2)  Admin of Therapeutic Inj  intramuscular or subcutaneous [44010]

## 2010-06-04 NOTE — Assessment & Plan Note (Signed)
Summary: UPSET STOMACH / DISCOMFORT // RS   Vital Signs:  Patient profile:   60 year old female Weight:      222 pounds Temp:     98.6 degrees F oral BP sitting:   130 / 78  (left arm) Cuff size:   regular  Vitals Entered By: Duard Brady LPN (August 27, 2009 2:44 PM)  Primary Care Provider:  Joette Catching, MD   History of Present Illness: 60 year old patient who has a 4 to 5 year history paroxysms of nausea, vomiting, and diarrhea.  She has had 3 episodes this month, which is a bothersome, increase in frequency in 2009.  She underwent colonoscopy, as well as upper panendoscopy.  She is felt to have IBS and also perhaps a component of lactose intolerance.  When she is ill.  She is unable to tolerate any oral medication  Allergies (verified): No Known Drug Allergies  Past History:  Past Medical History: Current Problems:  REFLEX SYMPATHETIC DYSTROPHY (ICD-337.20) ARTHRITIS (ICD-716.90) LACTOSE INTOLERANCE (ICD-271.3) IRRITABLE BOWEL SYNDROME (ICD-564.1) EXTERNAL HEMORRHOIDS (ICD-455.3) GASTRIC POLYP (ICD-211.1) GERD  Review of Systems       The patient complains of anorexia and abdominal pain.  The patient denies fever, weight loss, weight gain, vision loss, decreased hearing, hoarseness, chest pain, syncope, dyspnea on exertion, peripheral edema, prolonged cough, headaches, hemoptysis, melena, hematochezia, severe indigestion/heartburn, hematuria, incontinence, genital sores, muscle weakness, suspicious skin lesions, transient blindness, difficulty walking, depression, unusual weight change, abnormal bleeding, enlarged lymph nodes, angioedema, and breast masses.    Physical Exam  General:  overweight-appearing.  distress blood pressure 120/70 no tachycardiaoverweight-appearing.   Mouth:  Oral mucosa and oropharynx without lesions or exudates.  Teeth in good repair. appears well-hydrated Neck:  No deformities, masses, or tenderness noted. Lungs:  Normal respiratory  effort, chest expands symmetrically. Lungs are clear to auscultation, no crackles or wheezes. Heart:  Normal rate and regular rhythm. S1 and S2 normal without gallop, murmur, click, rub or other extra sounds. Abdomen:  mild generalized tenderness.  Bowel sounds normal.  No guarding   Impression & Recommendations:  Problem # 1:  NAUSEA WITH VOMITING (ICD-787.01)  Problem # 2:  GERD (ICD-530.81)  The following medications were removed from the medication list:    Nexium 40 Mg Pack (Esomeprazole magnesium) .Marland Kitchen... 1 once daily Her updated medication list for this problem includes:    Pantoprazole Sodium 40 Mg Tbec (Pantoprazole sodium) ..... One daily    The following medications were removed from the medication list:    Nexium 40 Mg Pack (Esomeprazole magnesium) .Marland Kitchen... 1 once daily Her updated medication list for this problem includes:    Pantoprazole Sodium 40 Mg Tbec (Pantoprazole sodium) ..... One daily  Orders: Prescription Created Electronically [phone])  Complete Medication List: 1)  Lunesta 3 Mg Tabs (Eszopiclone) .... Take 1 tab by mouth at bedtime 2)  Opana 5 Mg Tabs (Oxymorphone hcl) .... Take 1 tablet by mouth three times a day up to 5 times a day 3)  Cymbalta 60 Mg Cpep (Duloxetine hcl) .... Take 1 tablet by mouth once a day 4)  Clonidine Hcl 0.3 Mg Tabs (Clonidine hcl) .... Take 1 tablet by mouth three times a day 5)  Vitamin D 2000 Unit Tabs (Cholecalciferol) .... Take 1 tablet by three times a day 6)  Adult Aspirin Ec Low Strength 81 Mg Tbec (Aspirin) .... Take 1 tablet by mouth once a day 7)  Fish Oil 300 Mg Caps (Omega-3 fatty acids) .... Take  1 tablet by mouth two times a day 8)  Calcium 1500 Mg Tabs (Calcium carbonate) .... Take 1 tablet by mouth once a day 9)  Lidoderm 5 % Ptch (Lidocaine) .... Use as needed 10)  Topamax 25 Mg Tabs (Topiramate) .Marland Kitchen.. 1 three times a day 11)  Promethazine Hcl 50 Mg Supp (Promethazine hcl) .... Use every 6 hours as needed for  nausea 12)  Pantoprazole Sodium 40 Mg Tbec (Pantoprazole sodium) .... One daily  Patient Instructions: 1)  Drink clear liquids only for the next 24 hours, then slowly add other liquids and food as you  tolerate them. 2)  follow Dr. Jarold Motto 3)  Phenergan suppositories as discussed Prescriptions: PANTOPRAZOLE SODIUM 40 MG TBEC (PANTOPRAZOLE SODIUM) one daily  #90 x 4   Entered and Authorized by:   [name]  MD   Signed by:   [name]  MD on 08/27/2009   Method used:   Print then Give to Patient   RxID:   [id number] PROMETHAZINE HCL 50 MG SUPP (PROMETHAZINE HCL) use every 6 hours as needed for nausea  #20 x 2   Entered and Authorized by:   [name]  MD   Signed by:   [name]  MD on 08/27/2009   Method used:   Print then Give to Patient   RxID:   [id number] PANTOPRAZOLE SODIUM 40 MG TBEC (PANTOPRAZOLE SODIUM) one daily  #90 x 4   Entered and Authorized by:   [name]  MD   Signed by:   [name]  MD on 08/27/2009   Method used:   Electronically to        [facility] [address] [facility] (retail)       [address]       [place], [place]  [address]       Ph: [phone]       Fax: [phone]   RxID:   [id number] PROMETHAZINE HCL 50 MG SUPP (PROMETHAZINE HCL) use every 6 hours as needed for nausea  #20 x 2   Entered and Authorized by:   [name]  MD   Signed by:   [name]  MD on 08/27/2009   Method used:   Electronically to        [facility] [facility] (retail)       [address]       [place], [place]  [address]       Ph: [phone]       Fax: [phone]   RxID:   [id number]

## 2010-06-04 NOTE — Progress Notes (Signed)
Summary: B12 injections  Phone Note From Other Clinic   Caller: Lupita Leash at Dr. Norval Gable office Call For: Dr. Amador Cunas Summary of Call: B12 running low, still obtaining more studies to eval. Dr. Jarold Motto wants to do b12 injection qwk x3 wks then will put her on nasal spray. They are running low on injectable b12-back ordered and wondered if we could give the 3 injections and then Dr. Jarold Motto will rx nasal spray.  [phone] Initial call taken by: Duard Brady LPN,  Sep 12, 2009 10:13 AM  Follow-up for Phone Call        spoke with Lupita Leash - will check with Dr. Amador Cunas and let her know. KIK Follow-up by: Duard Brady LPN,  Sep 12, 2009 10:13 AM  Additional Follow-up for Phone Call Additional follow up Details #1::        ok Additional Follow-up by: [name]  MD,  Sep 12, 2009 12:37 PM    Additional Follow-up for Phone Call Additional follow up Details #2::    Pt called and wants to know when she can start the B-12 inj. Pt wants to start them on Friday 09/14/09. Please call [phone] cell.  Follow-up by: Lucy Antigua,  Sep 12, 2009 4:14 PM  Additional Follow-up for Phone Call Additional follow up Details #3:: Details for Additional Follow-up Action Taken: attempt to call - ans mach - LMTCB per Dr. Amador Cunas - ok to give b12 qwk x 3 . I will not be here but rachel has agreed to give shot for me - Dr. Amador Cunas will be out that day. KIK Additional Follow-up by: Duard Brady LPN,  Sep 13, 2009 8:47 AM

## 2010-06-04 NOTE — Progress Notes (Signed)
Summary: PT NEEDS SAMPLES WAITING ON PA  Phone Note Call from Patient Call back at Home Phone [phone]   Caller: Patient Call For: [name]  MD Summary of Call: pt needs samples of nexium 40 mg waiting on PA Initial call taken by: Heron Sabins,  July 04, 2009 9:32 AM  Follow-up for Phone Call        # 20 pills given while waiting on Pre-auth  KIK Follow-up by: Duard Brady LPN,  July 04, 2009 9:44 AM

## 2010-06-04 NOTE — Progress Notes (Signed)
Summary: Triage  Phone Note Call from Patient   Caller: Patient Summary of Call: Pt states our phone number was on her caller ID yesterday.  She is returning the call.  No record of a call to pt.  She has Hidda scan done and is asking for results.  States she is still having spells of nausea and vomiting.  Would like to know what she should do next? Initial call taken by: Ashok Cordia RN,  Sep 26, 2009 9:21 AM  Follow-up for Phone Call        I LEFT A MESSAGE ON PT'S HOME PHONE. I DISCUSSED WITH DR. [name]-SINCE SHE HAD PAIN WITH THE [facility] HIDA SCAN-HER SXS MAY BE DUE TO HER GALLBLADDER. PLEASE GET HER A SURGICAL APPT ASAP -DR. WEATHERLY WOULD BE GOOD OPTION. IF SHE NEEDS ANYTHING FOR NAUSEA ETC-LET ME KNOW -THANKS Follow-up by: Peterson Ao,  Sep 26, 2009 11:18 AM  Additional Follow-up for Phone Call Additional follow up Details #1::        Appt sch for pt to see Dr. Zachery Dakins on 10/15/09 at 2:10.  Arrive at 1:45.  Pt notified of appt.  Pt states she has a refill on suppositories. Additional Follow-up by: Ashok Cordia RN,  Sep 26, 2009 1:51 PM

## 2010-06-04 NOTE — Assessment & Plan Note (Signed)
Summary: N/V,  KUB done/dfs   History of Present Illness Visit Type: Follow-up Visit Primary GI MD: Haley Bison MD FACP FAGA Primary Provider: Eleonore Chiquito, MD  Requesting Provider: n/a Chief Complaint: N&V KUB done today History of Present Illness:   60 YO FEMALE KNOWN TO Haley Jenkins WHO HAS BEEN UNDERGOING EVALUATION FOR EPISODES OF RECURRENT ABDOMINAL PAIN,NAUSEA,AND VOMITING. SHE HAD A WORKUP IN 2009 WHICH WAS NEGATIVE EXCEPT FINDING ON COLONOSCOPY OF A LONG REDUNDANT COLON AND ?OF MOBILE CECUM. SHE STARTED  HAVING  RECURRENT PROBLEMS ABOUT 3 WEEKS AGO . SHE GETS PAIN IN THE EPIGASTRIUM,AND RIGHT ABDOMEN  RADIATING IN TO THE RLQ. SHE HAS N/V FOR ABOUT 12 HOURS, THEN FEELS SORE AND HAS DIARRHEA. SHE FEELS BAD FOR A COUPLE DAYS,THEN RESOLVES. 5-6 DAYS LATER SXS START AGAIN AND SHE GOES THRU THE SAME PROCESS. SHE HAD LABS DONE 5/3 UNREMARKABLE EXCEPT A BORDERLINE B12 LEVEL, AND LOW FE SAT. BE WAS DONE YESTERDAY-READ AS NORMAL,WITH REDUNDANCY OF  TRANSVERSE COLON- CECUM IN NORMAL LOCATION. SHE HAD ONSET THIS AM WITH RECURRENT SXS OF PAIN NAUSEA AND VOMITING.   GI Review of Systems    Reports abdominal pain, bloating, nausea, and  vomiting.     Location of  Abdominal pain: right side.    Denies acid reflux, belching, chest pain, dysphagia with liquids, dysphagia with solids, heartburn, loss of appetite, vomiting blood, weight loss, and  weight gain.      Reports diarrhea.     Denies anal fissure, black tarry stools, change in bowel habit, constipation, diverticulosis, fecal incontinence, heme positive stool, hemorrhoids, irritable bowel syndrome, jaundice, light color stool, liver problems, rectal bleeding, and  rectal pain.    Current Medications (verified): 1)  Lunesta 3 Mg Tabs (Eszopiclone) .... Take 1 Tab By Mouth At Bedtime 2)  Cymbalta 60 Mg Cpep (Duloxetine Hcl) .... Take 1 Tablet By Mouth Once A Day 3)  Clonidine Hcl 0.3 Mg Tabs (Clonidine Hcl) .... Take 1 Tablet By Mouth  Three Times A Day 4)  Vitamin D 2000 Unit Tabs (Cholecalciferol) .... Take 2  Tablet By By Mouth Two Times A Day 5)  Adult Aspirin Ec Low Strength 81 Mg Tbec (Aspirin) .... Take 1 Tablet By Mouth Once A Day 6)  Fish Oil 300 Mg Caps (Omega-3 Fatty Acids) .... Take 1 Tablet By Mouth Two Times A Day 7)  Calcium 1500 Mg Tabs (Calcium Carbonate) .... Take 1 Tablet By Mouth Once A Day 8)  Lidoderm 5 % Ptch (Lidocaine) .... Use As Needed 9)  Topamax 25 Mg Tabs (Topiramate) .... One Tablet By Mouth Once Daily 10)  Promethazine Hcl 50 Mg Supp (Promethazine Hcl) .... Use Every 6 Hours As Needed For Nausea 11)  Pantoprazole Sodium 40 Mg Tbec (Pantoprazole Sodium) .... One Daily 12)  Promethazine Hcl 25 Mg Supp (Promethazine Hcl) .... Q4 Hr Per Rectum As Needed Nausea and Vomiting 13)  Opana Er 15 Mg Xr12h-Tab (Oxymorphone Hcl) .... One Tablet By Mouth Two Times A Day  Allergies (verified): No Known Drug Allergies  Past History:  Past Medical History: Reviewed history from 08/27/2009 and no changes required. Current Problems:  REFLEX SYMPATHETIC DYSTROPHY (ICD-337.20) ARTHRITIS (ICD-716.90) LACTOSE INTOLERANCE (ICD-271.3) IRRITABLE BOWEL SYNDROME (ICD-564.1) EXTERNAL HEMORRHOIDS (ICD-455.3) GASTRIC POLYP (ICD-211.1) GERD  Past Surgical History: Reviewed history from 02/05/2009 and no changes required. knee surgery Tubal Ligation Breast surgery cluster cyst removed X2 Sinus surgery 1984 history of cleft palate repaired at [facility]  colonoscopy 2004  Family History: Reviewed history  from 02/05/2009 and no changes required. Family History of Breast Cancer:  sister-age 38 Family History of Ovarian Cancer: Mother Family History of Colon Cancer: aunt Family History of Heart Disease: MGF Family History of Irritable Bowel Syndrome: daughter Family History of Kidney Disease: mother Lung Cancer: Father, Age 18, COPD Mother: CAD, solitary kidney,osteoporosis  4 sisters, 1  brother  Social History: Reviewed history from 01/18/2008 and no changes required. 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  Alcohol Use - no Illicit Drug Use - no  Review of Systems       The patient complains of fatigue and night sweats.  The patient denies allergy/sinus, anemia, anxiety-new, arthritis/joint pain, back pain, blood in urine, breast changes/lumps, change in vision, confusion, cough, coughing up blood, depression-new, fainting, fever, headaches-new, hearing problems, heart murmur, heart rhythm changes, itching, menstrual pain, muscle pains/cramps, nosebleeds, pregnancy symptoms, shortness of breath, skin rash, sleeping problems, sore throat, swelling of feet/legs, swollen lymph glands, thirst - excessive , urination - excessive , urination changes/pain, urine leakage, vision changes, and voice change.         ROS OTHERWISE AS IN HPI  Vital Signs:  Patient profile:   60 year old female Height:      68 inches Weight:      226 pounds BMI:     34.49 Pulse rate:   120 / minute Pulse rhythm:   regular BP sitting:   128 / 88  (left arm) Cuff size:   regular  Vitals Entered By: Haley Jenkins CMA [name]) (Sep 07, 2009 10:57 AM)  Physical Exam  General:  Well developed, well nourished, no acute distress. Head:  Normocephalic and atraumatic. Eyes:  PERRLA, no icterus. Lungs:  Clear throughout to auscultation. Heart:  Regular rate and rhythm; no murmurs, rubs,  or bruits. Abdomen:  SOFT, BS+ DECREASED,TENDER RUQ/RMQ AND EPIGASTRIUM, NOGUARDING, NO DISTENTION, NO HSM Rectal:  NOT DONE Extremities:  No clubbing, cyanosis, edema or deformities noted. Neurologic:  Alert and  oriented x4;  grossly normal neurologically. Psych:  Alert and cooperative. Normal mood and affect.   Impression & Recommendations:  Problem # 1:  NAUSEA WITH VOMITING (ICD-787.01) Assessment Deteriorated 60 YO FEMALE WITH RECURRENT EPISODES OF ABDOMINAL PAIN(,EPIGASTRIC AND  RIGHT SIDED), ASSOCIATED WITH NAUSEA AND VOMITING. ETIOLOGY IS NOT CLEAR. R/O INTERMITTENT CECAL VOLVULUS,OBSTRUCTION, R/O GB DISEASE.(US NEGATIVE 2009)  CLEAR LIQUIDS CONTINUE PHENERGAN SUPP AS NEEDED. SCHEDULE FOR CT ABD/PELVIS TODAY WHILE SYMPTOMATIC-FURTHER PLANS PENDING CT FINDINGS Orders: CT Abdomen/Pelvis with Contrast (CT Abd/Pelvis w/con)  Problem # 2:  REFLEX SYMPATHETIC DYSTROPHY (ICD-337.20) Assessment: Comment Only CHRONIC PAIN SYNDROME  Patient Instructions: 1)  We are sending you to [place] [facility] [address] for a CT scan. 2)  Have nothing to eat or drink until after the test. Directions given along with contrast. 3)  Copy sent to : Beverely Low, MD 4)  The medication list was reviewed and reconciled.  All changed / newly prescribed medications were explained.  A complete medication list was provided to the patient / caregiver.

## 2010-06-04 NOTE — Assessment & Plan Note (Signed)
Summary: 1st B-12 inj/per Kim/cjr  Nurse Visit   Vital Signs:  Patient profile:   60 year old female Temp:     98.4 degrees F  Vitals Entered By: Duard Brady LPN (Sep 13, 2009 10:20 AM)  Allergies: No Known Drug Allergies  Medication Administration  Injection # 1:    Medication: Vit B12 1000 mcg    Diagnosis: B12 DEFICIENCY (ICD-266.2)    Route: IM    Site: L deltoid    Exp Date: 01/2011    Lot #: [id number]    Mfr: [employer] Regent    Patient tolerated injection without complications    Given by: Duard Brady LPN (Sep 13, 2009 10:21 AM)  Orders Added: 1)  Vit B12 1000 mcg [J3420] 2)  Admin of Therapeutic Inj  intramuscular or subcutaneous [[id number]]   first of 3 weekly injections  KIK

## 2010-06-04 NOTE — Assessment & Plan Note (Signed)
Summary: CPX // RS   Vital Signs:  Patient profile:   60 year old female Height:      68 inches Weight:      229 pounds Temp:     98.5 degrees F oral BP sitting:   114 / 88  (left arm) Cuff size:   regular  Vitals Entered By: Alfred Levins, CMA (March 01, 2010 1:54 PM) CC: cpx, no pap   Primary Care Provider:  Eleonore Chiquito, MD   CC:  cpx and no pap.  History of Present Illness: 60 year old patient who is in today for a wellness exam.  She has had a recent   GYN  evaluation.  She is followed closely by GI.  Today she is doing quite well.  She does have a history also of B12 deficiency  Current Medications (verified): 1)  Lunesta 3 Mg Tabs (Eszopiclone) .... Take 1 Tab By Mouth At Bedtime 2)  Cymbalta 60 Mg Cpep (Duloxetine Hcl) .... Take 1 Tablet By Mouth Once A Day 3)  Vitamin D 2000 Unit Tabs (Cholecalciferol) .... Take 2  Tablet By By Mouth Two Times A Day 4)  Adult Aspirin Ec Low Strength 81 Mg Tbec (Aspirin) .... Take 1 Tablet By Mouth Once A Day 5)  Fish Oil 300 Mg Caps (Omega-3 Fatty Acids) .... Take 1 Tablet By Mouth Two Times A Day 6)  Calcium 1500 Mg Tabs (Calcium Carbonate) .... Take 1 Tablet By Mouth Once A Day 7)  Lidoderm 5 % Ptch (Lidocaine) .... Use As Needed 8)  Promethazine Hcl 50 Mg Supp (Promethazine Hcl) .... Use Every 6 Hours As Needed For Nausea 9)  Pantoprazole Sodium 40 Mg Tbec (Pantoprazole Sodium) .... One Daily 10)  Opana Er 15 Mg Xr12h-Tab (Oxymorphone Hcl) .... One Tablet By Mouth Two Times A Day 11)  Nascobal 500 Mcg/0.46ml Soln (Cyanocobalamin) .Marland Kitchen.. 1 Spray Weekly  Allergies (verified): No Known Drug Allergies  Past History:  Past Medical History: Current Problems:  REFLEX SYMPATHETIC DYSTROPHY (ICD-337.20) ARTHRITIS (ICD-716.90) LACTOSE INTOLERANCE (ICD-271.3) IRRITABLE BOWEL SYNDROME (ICD-564.1) EXTERNAL HEMORRHOIDS (ICD-455.3) GASTRIC POLYP (ICD-211.1) GERD Hiatal hernia   Past Surgical History: knee surgery Tubal  Ligation Breast surgery cluster cyst removed X2 Sinus surgery 1984 history of cleft palate repaired at [facility] EGD 2009  colonoscopy 2004  Family History: Reviewed history from 02/05/2009 and no changes required. Family History of Breast Cancer:  sister-age 60 Family History of Ovarian Cancer: Mother Family History of Colon Cancer: aunt Family History of Heart Disease: MGF Family History of Irritable Bowel Syndrome: daughter Family History of Kidney Disease: mother Lung Cancer: Father, Age 3, COPD Mother: CAD, solitary kidney,osteoporosis  4 sisters, 1 brother-  breast ca   Social History: Reviewed history from 01/18/2008 and no changes required. 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  Alcohol Use - no Illicit Drug Use - no  Review of Systems  The patient denies anorexia, fever, weight loss, weight gain, vision loss, decreased hearing, hoarseness, chest pain, syncope, dyspnea on exertion, peripheral edema, prolonged cough, headaches, hemoptysis, abdominal pain, melena, hematochezia, severe indigestion/heartburn, hematuria, incontinence, genital sores, muscle weakness, suspicious skin lesions, transient blindness, difficulty walking, depression, unusual weight change, abnormal bleeding, enlarged lymph nodes, angioedema, and breast masses.    Physical Exam  General:  overweight-appearing.  130/90 Head:  Normocephalic and atraumatic without obvious abnormalities. No apparent alopecia or balding. Eyes:  No corneal or conjunctival inflammation noted. EOMI. Perrla. Funduscopic exam benign, without hemorrhages, exudates or papilledema. Vision grossly  normal. Ears:  External ear exam shows no significant lesions or deformities.  Otoscopic examination reveals clear canals, tympanic membranes are intact bilaterally without bulging, retraction, inflammation or discharge. Hearing is grossly normal bilaterally. Nose:  External nasal examination shows no  deformity or inflammation. Nasal mucosa are pink and moist without lesions or exudates. Mouth:  Oral mucosa and oropharynx without lesions or exudates.  Teeth in good repair. Neck:  No deformities, masses, or tenderness noted. Chest Wall:  No deformities, masses, or tenderness noted. Lungs:  Normal respiratory effort, chest expands symmetrically. Lungs are clear to auscultation, no crackles or wheezes. Heart:  Normal rate and regular rhythm. S1 and S2 normal without gallop, murmur, click, rub or other extra sounds. Abdomen:  Bowel sounds positive,abdomen soft and non-tender without masses, organomegaly or hernias noted. Msk:  No deformity or scoliosis noted of thoracic or lumbar spine.   Pulses:  R and L carotid,radial,femoral,dorsalis pedis and posterior tibial pulses are full and equal bilaterally Extremities:  No clubbing, cyanosis, edema, or deformity noted with normal full range of motion of all joints.   Neurologic:  No cranial nerve deficits noted. Station and gait are normal. Plantar reflexes are down-going bilaterally. DTRs are symmetrical throughout. Sensory, motor and coordinative functions appear intact. Skin:  Intact without suspicious lesions or rashes Cervical Nodes:  No lymphadenopathy noted Axillary Nodes:  No palpable lymphadenopathy Inguinal Nodes:  No significant adenopathy Psych:  Cognition and judgment appear intact. Alert and cooperative with normal attention span and concentration. No apparent delusions, illusions, hallucinations   Impression & Recommendations:  Problem # 1:  Preventive Health Care (ICD-V70.0)  Complete Medication List: 1)  Lunesta 3 Mg Tabs (Eszopiclone) .... Take 1 tab by mouth at bedtime 2)  Cymbalta 60 Mg Cpep (Duloxetine hcl) .... Take 1 tablet by mouth once a day 3)  Vitamin D 2000 Unit Tabs (Cholecalciferol) .... Take 2  tablet by by mouth two times a day 4)  Adult Aspirin Ec Low Strength 81 Mg Tbec (Aspirin) .... Take 1 tablet by mouth once a  day 5)  Fish Oil 300 Mg Caps (Omega-3 fatty acids) .... Take 1 tablet by mouth two times a day 6)  Calcium 1500 Mg Tabs (Calcium carbonate) .... Take 1 tablet by mouth once a day 7)  Lidoderm 5 % Ptch (Lidocaine) .... Use as needed 8)  Promethazine Hcl 50 Mg Supp (Promethazine hcl) .... Use every 6 hours as needed for nausea 9)  Pantoprazole Sodium 40 Mg Tbec (Pantoprazole sodium) .... One daily 10)  Opana Er 15 Mg Xr12h-tab (Oxymorphone hcl) .... One tablet by mouth two times a day 11)  Nascobal 500 Mcg/0.22ml Soln (Cyanocobalamin) .Marland Kitchen.. 1 spray weekly  Patient Instructions: 1)  Please schedule a follow-up appointment in 1 year. 2)  Limit your Sodium (Salt). 3)  Avoid foods high in acid (tomatoes, citrus juices, spicy foods). Avoid eating within two hours of lying down or before exercising. Do not over eat; try smaller more frequent meals. Elevate head of bed twelve inches when sleeping. 4)  It is important that you exercise regularly at least 20 minutes 5 times a week. If you develop chest pain, have severe difficulty breathing, or feel very tired , stop exercising immediately and seek medical attention. 5)  You need to lose weight. Consider a lower calorie diet and regular exercise.  6)  Take calcium +Vitamin D daily. Prescriptions: NASCOBAL 500 MCG/0.1ML SOLN (CYANOCOBALAMIN) 1 spray weekly  #1 x 6   Entered and Authorized by:  [name]  MD   Signed by:   [name]  MD on 03/01/2010   Method used:   Print then Give to Patient   RxID:   [id number] PANTOPRAZOLE SODIUM 40 MG TBEC (PANTOPRAZOLE SODIUM) one daily  #90 x 4   Entered and Authorized by:   [name]  MD   Signed by:   [name]  MD on 03/01/2010   Method used:   Print then Give to Patient   RxID:   [id number] CYMBALTA 60 MG CPEP (DULOXETINE HCL) Take 1 tablet by mouth once a day  #90 x 6   Entered and Authorized by:   [name]  MD   Signed by:   [name]  MD on 03/01/2010   Method used:   Print then Give to Patient   RxID:   [id number] LUNESTA 3 MG TABS (ESZOPICLONE) Take 1 tab by mouth at bedtime  #30 x 4   Entered and Authorized by:   [name]  MD   Signed by:   [name]  MD on 03/01/2010   Method used:   Print then Give to Patient   RxID:   [id number]    Orders Added: 1)  Est. Patient 40-64 years [[id number]]

## 2010-06-04 NOTE — Assessment & Plan Note (Signed)
Summary: b12 inj/njr  Nurse Visit   Allergies: No Known Drug Allergies  Medication Administration  Injection # 1:    Medication: Vit B12 1000 mcg    Diagnosis: B12 DEFICIENCY (ICD-266.2)    Route: IM    Site: R deltoid    Exp Date: 01/2011    Lot #: [id number]    Mfr: [employer] Regent    Patient tolerated injection without complications    Given by: Duard Brady LPN (Sep 20, 2009 2:59 PM)  Orders Added: 1)  Vit B12 1000 mcg [J3420] 2)  Admin of Therapeutic Inj  intramuscular or subcutaneous [[id number]]

## 2010-06-04 NOTE — Miscellaneous (Signed)
Summary: [facility]  Clinical Lists Changes  Orders: Added new Referral order of [facility] ([facility]) - Signed

## 2010-06-04 NOTE — Progress Notes (Signed)
Summary: Triage  Phone Note Call from Patient   Caller: Patient Summary of Call: Pt stopped by office.  She asks about results of BE.   Pt states she is sick [place] today.  Nausea and vomiting.  Asks if she can talk to Dr. Jarold Motto for a few minutes.  Informed pt that Dr. Jarold Motto had ordered [facility] for her to try.  She asks what she is being treated for? Initial call taken by: Ashok Cordia RN,  Sep 07, 2009 9:25 AM  Follow-up for Phone Call        Go for stat KUB and then OV with Amy Esterwood.  per Dr. Jarold Motto.   Follow-up by: Ashok Cordia RN,  Sep 07, 2009 9:56 AM  Additional Follow-up for Phone Call Additional follow up Details #1::        Pt notified.  Appt sch Additional Follow-up by: Ashok Cordia RN,  Sep 07, 2009 10:43 AM

## 2010-06-04 NOTE — Progress Notes (Signed)
Summary: rx to [facility] for promethazine 25mg   Phone Note Call from Patient Call back at [phone]   Caller: [facility]--live call Summary of Call: promethazine 50mg  supp was escribed today. Not available until tomorrow. Is this ok to wait or change to a 25mg ? Call [facility] in [place]. Initial call taken by: Warnell Forester,  August 27, 2009 3:29 PM  Follow-up for Phone Call        25 mg?? OK Follow-up by: [name]  MD,  August 27, 2009 4:18 PM  Additional Follow-up for Phone Call Additional follow up Details #1::        called [facility] - gave order for 25mg  - q4 hrs as needed #12 - and can pick up 50 mg tomorrow - pt aware.   change to med list done. KIK Additional Follow-up by: Duard Brady LPN,  August 27, 2009 4:25 PM     Appended Document: rx to [facility] for promethazine 25mg     Clinical Lists Changes  Medications: Added new medication of PROMETHAZINE HCL 25 MG SUPP (PROMETHAZINE HCL) q4 hr per rectum as needed nausea and vomiting - Signed Rx of PROMETHAZINE HCL 25 MG SUPP (PROMETHAZINE HCL) q4 hr per rectum as needed nausea and vomiting;  #12 x 0;  Signed;  Entered by: Duard Brady LPN;  Authorized by: [name]  MD;  Method used: Historical    Prescriptions: PROMETHAZINE HCL 25 MG SUPP (PROMETHAZINE HCL) q4 hr per rectum as needed nausea and vomiting  #12 x 0   Entered by:   Duard Brady LPN   Authorized by:   [name]  MD   Signed by:   Duard Brady LPN on 29/56/2130   Method used:   Historical   RxID:   [id number]

## 2010-09-20 NOTE — Op Note (Signed)
[place]  Patient:    FLOYCE, BUJAK Visit Number: [id number] MRN: [id number]          Service Type: DSU Location: [facility] Attending Physician:  Kandis Mannan Dictated by:   Donnie Coffin Samuella Cota, M.D. Proc. Date: 05/14/01 Admit Date:  05/14/2001   CC:         Lafonda Mosses B. Thomasena Edis, M.D.             Delaney Meigs, M.D.   Operative Report  CCS #[id number].  PREOPERATIVE DIAGNOSIS:  Mass, right breast.  POSTOPERATIVE DIAGNOSIS:  Mass, right breast.  PROCEDURE:  Excision of mass, right breast.  SURGEON:  Maisie Fus B. Samuella Cota, M.D.  ANESTHESIA:  1% Xylocaine local, anesthesiologist and C.R.N.A.  DESCRIPTION OFPROCEDURE:  The patient was taken to the operating room and placed on the table in supine position.  The right breast was prepped and draped as asterile field.  The patient had a small palpable mass at the 6 oclock position about 2 cm from the edge of the areola.  A small transverse incision was outlined with the skin marker.  Xylocaine 1% local was then used to infiltrate the skin and underlying breast tissue.  The incision was made through skin and subcutaneous tissue, and about a 2 cm area of mainly fatty tissue with some breast tissue was excised.   There was no hard mass which felt like tumor.  This may well have been all just a large lipoma.  Bleeding was controlled with the cautery.  The wound was closed with interrupted suture of 3-0 Vicryl.  The edges of the skin were somewhat frayed, and they were cut back to normal-looking tissue.  Subcutaneous 4-0 Vicryl suture was used to close the skin, reinforced by benzoin and half-inch Steri-Strips.  Dry sterile dressing was applied using 4 x 4s, ABD, and four-inch Hypafix.  The specimen was sent fresh to the pathologist, but no frozen section requested.  The patient seemed to tolerate the procedure well and was taken to the PACU in satisfactory condition. Dictated by:   Donnie Coffin Samuella Cota,  M.D. Attending Physician:  Kandis Mannan DD:  05/14/01 TD:  05/15/01 Job: [id number]

## 2010-09-22 ENCOUNTER — Other Ambulatory Visit: Payer: Self-pay | Admitting: Internal Medicine

## 2010-10-09 ENCOUNTER — Other Ambulatory Visit: Payer: Self-pay | Admitting: Dermatology

## 2010-11-04 ENCOUNTER — Other Ambulatory Visit: Payer: Self-pay | Admitting: Gastroenterology

## 2010-11-12 ENCOUNTER — Other Ambulatory Visit: Payer: Self-pay | Admitting: Gastroenterology

## 2010-11-28 ENCOUNTER — Ambulatory Visit
Admission: RE | Admit: 2010-11-28 | Discharge: 2010-11-28 | Disposition: A | Discharge status: Home / Self Care | Payer: BC Managed Care – PPO | Source: Ambulatory Visit | Attending: Obstetrics and Gynecology | Admitting: Obstetrics and Gynecology

## 2010-11-28 ENCOUNTER — Other Ambulatory Visit: Payer: Self-pay | Admitting: Obstetrics and Gynecology

## 2010-11-28 DIAGNOSIS — Z1231 Encounter for screening mammogram for malignant neoplasm of breast: Secondary | ICD-10-CM

## 2011-01-21 ENCOUNTER — Other Ambulatory Visit: Payer: Self-pay | Admitting: Anesthesiology

## 2011-01-21 DIAGNOSIS — IMO0002 Reserved for concepts with insufficient information to code with codable children: Secondary | ICD-10-CM

## 2011-01-21 DIAGNOSIS — R29898 Other symptoms and signs involving the musculoskeletal system: Secondary | ICD-10-CM

## 2011-01-22 ENCOUNTER — Ambulatory Visit
Admission: RE | Admit: 2011-01-22 | Discharge: 2011-01-22 | Disposition: A | Discharge status: Home / Self Care | Payer: Commercial Indemnity | Source: Ambulatory Visit | Attending: Anesthesiology | Admitting: Anesthesiology

## 2011-01-22 DIAGNOSIS — R29898 Other symptoms and signs involving the musculoskeletal system: Secondary | ICD-10-CM

## 2011-01-22 DIAGNOSIS — IMO0002 Reserved for concepts with insufficient information to code with codable children: Secondary | ICD-10-CM

## 2011-04-07 ENCOUNTER — Other Ambulatory Visit: Payer: Self-pay | Admitting: Internal Medicine

## 2011-05-29 ENCOUNTER — Other Ambulatory Visit (INDEPENDENT_AMBULATORY_CARE_PROVIDER_SITE_OTHER): Payer: Commercial Indemnity

## 2011-05-29 DIAGNOSIS — Z0000 Encounter for general adult medical examination without abnormal findings: Secondary | ICD-10-CM

## 2011-05-29 LAB — HEPATIC FUNCTION PANEL
Albumin: 4.4 g/dL (ref 3.5–5.2)
Bilirubin, Direct: 0 mg/dL (ref 0.0–0.3)
Total Protein: 6.9 g/dL (ref 6.0–8.3)

## 2011-05-29 LAB — BASIC METABOLIC PANEL
CO2: 29 mEq/L (ref 19–32)
Chloride: 105 mEq/L (ref 96–112)
Creatinine, Ser: 0.9 mg/dL (ref 0.4–1.2)

## 2011-05-29 LAB — LIPID PANEL
Cholesterol: 192 mg/dL (ref 0–200)
HDL: 34.8 mg/dL — ABNORMAL LOW (ref >39.00)
Triglycerides: 169 mg/dL — ABNORMAL HIGH (ref 0.0–149.0)

## 2011-05-29 LAB — POCT URINALYSIS DIPSTICK
Bilirubin, UA: NEGATIVE
Blood, UA: NEGATIVE
Ketones, UA: NEGATIVE
pH, UA: 7

## 2011-05-30 LAB — CBC WITH DIFFERENTIAL/PLATELET
Basophils Relative: 0.6 % (ref 0.0–3.0)
Eosinophils Absolute: 0.1 10*3/uL (ref 0.0–0.7)
Lymphs Abs: 1.7 10*3/uL (ref 0.7–4.0)
MCHC: 33 g/dL (ref 30.0–36.0)
MCV: 95.7 fl (ref 78.0–100.0)
Monocytes Absolute: 0.3 10*3/uL (ref 0.1–1.0)
Neutro Abs: 3.2 10*3/uL (ref 1.4–7.7)
Neutrophils Relative %: 59.8 % (ref 43.0–77.0)
RBC: 4.79 Mil/uL (ref 3.87–5.11)

## 2011-06-05 ENCOUNTER — Ambulatory Visit (INDEPENDENT_AMBULATORY_CARE_PROVIDER_SITE_OTHER): Payer: Commercial Indemnity | Admitting: Internal Medicine

## 2011-06-05 ENCOUNTER — Encounter: Payer: Self-pay | Admitting: Internal Medicine

## 2011-06-05 DIAGNOSIS — E538 Deficiency of other specified B group vitamins: Secondary | ICD-10-CM

## 2011-06-05 DIAGNOSIS — G9050 Complex regional pain syndrome I, unspecified: Secondary | ICD-10-CM

## 2011-06-05 DIAGNOSIS — Z0000 Encounter for general adult medical examination without abnormal findings: Secondary | ICD-10-CM

## 2011-06-05 MED ORDER — CYANOCOBALAMIN 1000 MCG/ML IJ SOLN: 1000.0000 ug | INTRAMUSCULAR | Status: DC

## 2011-06-05 MED ORDER — "SYRINGE/NEEDLE (DISP) 25G X 5/8"" 3 ML MISC": Status: DC

## 2011-06-05 MED ORDER — FLUTICASONE PROPIONATE 50 MCG/ACT NA SUSP: 2.0000 | Freq: Every day | NASAL | Status: DC

## 2011-06-05 MED ORDER — DULOXETINE HCL 60 MG PO CPEP: 60.0000 mg | ORAL_CAPSULE | Freq: Every day | ORAL | Status: DC

## 2011-06-05 MED ORDER — PANTOPRAZOLE SODIUM 40 MG PO TBEC: 40.0000 mg | DELAYED_RELEASE_TABLET | Freq: Every day | ORAL | Status: DC

## 2011-06-05 NOTE — Patient Instructions (Signed)
Limit your sodium (Salt) intake    It is important that you exercise regularly, at least 20 minutes 3 to 4 times per week.  If you develop chest pain or shortness of breath seek  medical attention.  You need to lose weight.  Consider a lower calorie diet and regular exercise.  Return in one year for follow-up 

## 2011-06-05 NOTE — Progress Notes (Signed)
Subjective:    Patient ID: Haley Jenkins, female    DOB: 08-Sep-1950, 61 y.o.   MRN: [id number]  HPI  61 year old patient who is seen today for an annual physical. She has seen by GI and is seen annually by gynecology. She has a history of reflex sympathetic dystrophy and has recently had radiofrequency ablation of  the left lumbar and sacral nerve roots. She is optimistic that this will be quite helpful. Clinically she is doing well. She does have a history of B12 deficiency which has been treated with weekly nasal supplement   History of Present Illness:  61 year old patient who is in today for a wellness exam. She has had a recent GYN evaluation. She is followed closely by GI. Today she is doing quite well. She does have a history also of B12 deficiency  Current Medications (verified):  1) Lunesta 3 Mg Tabs (Eszopiclone) .... Take 1 Tab By Mouth At Bedtime  2) Cymbalta 60 Mg Cpep (Duloxetine Hcl) .... Take 1 Tablet By Mouth Once A Day  3) Vitamin D 2000 Unit Tabs (Cholecalciferol) .... Take 2 Tablet By By Mouth Two Times A Day  4) Adult Aspirin Ec Low Strength 81 Mg Tbec (Aspirin) .... Take 1 Tablet By Mouth Once A Day  5) Fish Oil 300 Mg Caps (Omega-3 Fatty Acids) .... Take 1 Tablet By Mouth Two Times A Day  6) Calcium 1500 Mg Tabs (Calcium Carbonate) .... Take 1 Tablet By Mouth Once A Day  7) Lidoderm 5 % Ptch (Lidocaine) .... Use As Needed  8) Promethazine Hcl 50 Mg Supp (Promethazine Hcl) .... Use Every 6 Hours As Needed For Nausea  9) Pantoprazole Sodium 40 Mg Tbec (Pantoprazole Sodium) .... One Daily  10) Opana Er 15 Mg Xr12h-Tab (Oxymorphone Hcl) .... One Tablet By Mouth Two Times A Day  11) Nascobal 500 Mcg/0.50ml Soln (Cyanocobalamin) .Marland Kitchen.. 1 Spray Weekly  Allergies (verified):  No Known Drug Allergies  Past History:  Past Medical History:  Current Problems:  REFLEX SYMPATHETIC DYSTROPHY (ICD-337.20)  ARTHRITIS (ICD-716.90)  LACTOSE INTOLERANCE (ICD-271.3)  IRRITABLE BOWEL  SYNDROME (ICD-564.1)  EXTERNAL HEMORRHOIDS (ICD-455.3)  GASTRIC POLYP (ICD-211.1)  GERD  Hiatal hernia  Past Surgical History:  knee surgery  Tubal Ligation  Breast surgery cluster cyst removed X2  Sinus surgery 1984  history of cleft palate [facility]  EGD 2009  colonoscopy 2004  Family History:  Reviewed history from 02/05/2009 and no changes required.  Family History of Breast Cancer: sister-age 23  Family History of Ovarian Cancer: Mother  Family History of Colon Cancer: aunt  Family History of Heart Disease: MGF  Family History of Irritable Bowel Syndrome: daughter  Family History of Kidney Disease: mother  Lung Cancer: Father, Age 49, COPD  Mother: CAD, solitary kidney,osteoporosis  4 sisters, 1 brother- breast ca  Social History:  Reviewed history from 01/18/2008 and no changes required.  Married, 2 girls  Occupation: Audiological scientist  Patient has never smoked.  Alcohol Use - no  Illicit Drug Use - no    Past Medical History  Diagnosis Date  . Arthritis   . Lactose intolerance   . Irritable bowel syndrome   . External hemorrhoid   . Gastric polyp   . GERD (gastroesophageal reflux disease)   . Hiatal hernia     History   Social History  . Marital Status: Married    Spouse Name: N/A    Number of Children: N/A  . Years of Education: N/A  Occupational History  . Not on file.   Social History Main Topics  . Smoking status: Never Smoker   . Smokeless tobacco: Never Used  . Alcohol Use: Yes     rare   . Drug Use: Not on file  . Sexually Active: Not on file   Other Topics Concern  . Not on file   Social History Narrative  . No narrative on file    Past Surgical History  Procedure Date  . Knee surgery   . Tubal ligation   . Breast surgery cluster cyst removed     x 2  . Sinus surgery 1984  . Cleft palate repaired     at [facility]  . Esophagogastroduodenoscopy 2009  . Colonoscopy 2004    Family History  Problem  Relation Age of Onset  . Cancer Mother     ovarian   . Kidney disease Mother   . Osteoporosis Mother   . Cancer Father     lung  . COPD Father   . Cancer Sister 70    breast   . Irritable bowel syndrome Daughter   . Heart disease Maternal Grandfather   . Cancer Other     colon    Not on File  Current Outpatient Prescriptions on File Prior to Visit  Medication Sig Dispense Refill  . Cyanocobalamin (NASCOBAL) 500 MCG/0.1ML SOLN One spray, one nostril, once a week........Marland Kitchen MUST HAVE OFFICE VISIT  1 Bottle  0  . pantoprazole (PROTONIX) 40 MG tablet TAKE ONE TABLET BY MOUTH EVERY DAY  60 tablet  0    BP 128/86  Pulse 109  Temp(Src) 98.5 F (36.9 C) (Oral)  Ht 5' 7.5" (1.715 m)  Wt 236 lb (107.049 kg)  BMI 36.42 kg/m2  SpO2 95%       Review of Systems  Constitutional: Negative.   HENT: Negative for hearing loss, congestion, sore throat, rhinorrhea, dental problem, sinus pressure and tinnitus.   Eyes: Negative for pain, discharge and visual disturbance.  Respiratory: Negative for cough and shortness of breath.   Cardiovascular: Negative for chest pain, palpitations and leg swelling.  Gastrointestinal: Negative for nausea, vomiting, abdominal pain, diarrhea, constipation, blood in stool and abdominal distention.  Genitourinary: Negative for dysuria, urgency, frequency, hematuria, flank pain, vaginal bleeding, vaginal discharge, difficulty urinating, vaginal pain and pelvic pain.  Musculoskeletal: Positive for back pain and gait problem. Negative for joint swelling and arthralgias.  Skin: Negative for rash.  Neurological: Negative for dizziness, syncope, speech difficulty, weakness, numbness and headaches.  Hematological: Negative for adenopathy.  Psychiatric/Behavioral: Negative for behavioral problems, dysphoric mood and agitation. The patient is not nervous/anxious.        Objective:   Physical Exam  Constitutional: She is oriented to person, place, and time. She  appears well-developed and well-nourished.  HENT:  Head: Normocephalic and atraumatic.  Right Ear: External ear normal.  Left Ear: External ear normal.  Mouth/Throat: Oropharynx is clear and moist.  Eyes: Conjunctivae and EOM are normal.  Neck: Normal range of motion. Neck supple. No JVD present. No thyromegaly present.  Cardiovascular: Normal rate, regular rhythm, normal heart sounds and intact distal pulses.   No murmur heard. Pulmonary/Chest: Effort normal and breath sounds normal. She has no wheezes. She has no rales.  Abdominal: Soft. Bowel sounds are normal. She exhibits no distension and no mass. There is no tenderness. There is no rebound and no guarding.  Musculoskeletal: Normal range of motion. She exhibits no edema and no tenderness.  Neurological: She is alert and oriented to person, place, and time. She has normal reflexes. No cranial nerve deficit. She exhibits normal muscle tone. Coordination normal.       Laboratory sensation and monofilament testing decreased on the left. Left patellar reflex was diminished  Skin: Skin is warm and dry. No rash noted.  Psychiatric: She has a normal mood and affect. Her behavior is normal.          Assessment & Plan:   Preventive health examination Reflex sympathetic dystrophy B12 deficiency. Options were discussed we'll switch to monthly injections

## 2011-06-06 DIAGNOSIS — Z1371 Encounter for nonprocreative screening for genetic disease carrier status: Secondary | ICD-10-CM

## 2011-06-06 HISTORY — DX: Encounter for nonprocreative screening for genetic disease carrier status: Z13.71

## 2011-11-03 ENCOUNTER — Other Ambulatory Visit: Payer: Self-pay | Admitting: Obstetrics and Gynecology

## 2011-11-03 DIAGNOSIS — Z803 Family history of malignant neoplasm of breast: Secondary | ICD-10-CM

## 2011-11-11 ENCOUNTER — Other Ambulatory Visit: Payer: Self-pay | Admitting: Obstetrics and Gynecology

## 2011-11-11 DIAGNOSIS — Z1231 Encounter for screening mammogram for malignant neoplasm of breast: Secondary | ICD-10-CM

## 2011-12-04 ENCOUNTER — Other Ambulatory Visit: Payer: Self-pay

## 2011-12-04 MED ORDER — PANTOPRAZOLE SODIUM 40 MG PO TBEC: 40.0000 mg | DELAYED_RELEASE_TABLET | Freq: Every day | ORAL | Status: DC

## 2012-03-05 HISTORY — PX: URETHRAL SLING: SHX2621

## 2012-03-08 ENCOUNTER — Ambulatory Visit (INDEPENDENT_AMBULATORY_CARE_PROVIDER_SITE_OTHER): Payer: BC Managed Care – PPO | Admitting: Internal Medicine

## 2012-03-08 ENCOUNTER — Encounter: Payer: Self-pay | Admitting: Internal Medicine

## 2012-03-08 VITALS — BP 120/80 | Temp 98.1°F | Wt 245.0 lb

## 2012-03-08 DIAGNOSIS — G9050 Complex regional pain syndrome I, unspecified: Secondary | ICD-10-CM

## 2012-03-08 DIAGNOSIS — L03031 Cellulitis of right toe: Secondary | ICD-10-CM

## 2012-03-08 DIAGNOSIS — L03039 Cellulitis of unspecified toe: Secondary | ICD-10-CM

## 2012-03-08 MED ORDER — AMOXICILLIN-POT CLAVULANATE 875-125 MG PO TABS: 1.0000 | ORAL_TABLET | Freq: Two times a day (BID) | ORAL | Status: DC

## 2012-03-08 NOTE — Progress Notes (Signed)
Subjective:    Patient ID: Haley Jenkins, female    DOB: 10/25/1950, 61 y.o.   MRN: [id number]  HPI  61 year old patient has a history of RSV who presents with a chief complaint of a painful right great toe. There is been some drainage laterally with increased pain swelling and redness. No history of trauma.   No fever or constitutional complaints.  Past Medical History  Diagnosis Date  . Arthritis   . Lactose intolerance   . Irritable bowel syndrome   . External hemorrhoid   . Gastric polyp   . GERD (gastroesophageal reflux disease)   . Hiatal hernia     History   Social History  . Marital Status: Married    Spouse Name: N/A    Number of Children: N/A  . Years of Education: N/A   Occupational History  . Not on file.   Social History Main Topics  . Smoking status: Never Smoker   . Smokeless tobacco: Never Used  . Alcohol Use: Yes     Comment: rare   . Drug Use: Not on file  . Sexually Active: Not on file   Other Topics Concern  . Not on file   Social History Narrative  . No narrative on file    Past Surgical History  Procedure Date  . Knee surgery   . Tubal ligation   . Breast surgery cluster cyst removed     x 2  . Sinus surgery 1984  . Cleft palate repaired     at [facility]  . Esophagogastroduodenoscopy 2009  . Colonoscopy 2004    Family History  Problem Relation Age of Onset  . Cancer Mother     ovarian   . Kidney disease Mother   . Osteoporosis Mother   . Cancer Father     lung  . COPD Father   . Cancer Sister 32    breast   . Irritable bowel syndrome Daughter   . Heart disease Maternal Grandfather   . Cancer Other     colon    No Known Allergies  Current Outpatient Prescriptions on File Prior to Visit  Medication Sig Dispense Refill  . aspirin 81 MG tablet Take 160 mg by mouth daily.      . Calcium 1500 MG tablet Take 1,500 mg by mouth.      . Cholecalciferol (VITAMIN D) 2000 UNITS CAPS Take 2 capsules by mouth 2 (two)  times daily.      . cyanocobalamin (,VITAMIN B-12,) 1000 MCG/ML injection Inject 1 mL (1,000 mcg total) into the muscle every 30 (thirty) days.  30 mL  0  . DULoxetine (CYMBALTA) 60 MG capsule Take 1 capsule (60 mg total) by mouth daily.  90 capsule  6  . fluticasone (FLONASE) 50 MCG/ACT nasal spray Place 2 sprays into the nose daily.  1 g  0  . lidocaine (LIDODERM) 5 % Place 1 patch onto the skin daily. Remove & Discard patch within 12 hours or as directed by MD      . Omega-3 Fatty Acids (FISH OIL) 300 MG CAPS Take by mouth as needed.      Marland Kitchen oxymorphone (OPANA ER) 15 MG 12 hr tablet Take 15 mg by mouth every 12 (twelve) hours.      . pantoprazole (PROTONIX) 40 MG tablet Take 1 tablet (40 mg total) by mouth daily.  90 tablet  3  . promethazine (PHENERGAN) 50 MG tablet Take 50 mg by mouth every 6 (six)  hours as needed.      . SYRINGE-NEEDLE, DISP, 3 ML 25G X 5/8" 3 ML MISC Every 30 days.  12 each  1    BP 120/80  Temp 98.1 F (36.7 C) (Oral)  Wt 245 lb (111.131 kg)    Review of Systems  Constitutional: Negative.   HENT: Negative for hearing loss, congestion, sore throat, rhinorrhea, dental problem, sinus pressure and tinnitus.   Eyes: Negative for pain, discharge and visual disturbance.  Respiratory: Negative for cough and shortness of breath.   Cardiovascular: Negative for chest pain, palpitations and leg swelling.  Gastrointestinal: Negative for nausea, vomiting, abdominal pain, diarrhea, constipation, blood in stool and abdominal distention.  Genitourinary: Negative for dysuria, urgency, frequency, hematuria, flank pain, vaginal bleeding, vaginal discharge, difficulty urinating, vaginal pain and pelvic pain.  Musculoskeletal: Positive for gait problem. Negative for joint swelling and arthralgias.  Skin: Positive for wound. Negative for rash.  Neurological: Negative for dizziness, syncope, speech difficulty, weakness, numbness and headaches.  Hematological: Negative for adenopathy.    Psychiatric/Behavioral: Negative for behavioral problems, dysphoric mood and agitation. The patient is not nervous/anxious.        Objective:   Physical Exam  Constitutional: She appears well-developed and well-nourished. No distress.  Skin:       Right great toe paronychia with some erythema redness and minimal drainage involving the lateral aspect of the right great toenail          Assessment & Plan:   Right great toe paronychia. We'll treat department and soaks elevation . Will call if unimproved RSD. Stable continue analgesics

## 2012-03-08 NOTE — Patient Instructions (Signed)
Soak  the right foot twice dailyParonychia Paronychia is an inflammatory reaction involving the folds of the skin surrounding the fingernail. This is commonly caused by an infection in the skin around a nail. The most common cause of paronychia is frequent wetting of the hands (as seen with bartenders, food servers, nurses or others who wet their hands). This makes the skin around the fingernail susceptible to infection by bacteria (germs) or fungus. Other predisposing factors are:  Aggressive manicuring.   Nail biting.   Thumb sucking.  The most common cause is a staphylococcal (a type of germ) infection, or a fungal (Candida) infection. When caused by a germ, it usually comes on suddenly with redness, swelling, pus and is often painful. It may get under the nail and form an abscess (collection of pus), or form an abscess around the nail. If the nail itself is infected with a fungus, the treatment is usually prolonged and may require oral medicine for up to one year. Your caregiver will determine the length of time treatment is required. The paronychia caused by bacteria (germs) may largely be avoided by not pulling on hangnails or picking at cuticles. When the infection occurs at the tips of the finger it is called felon. When the cause of paronychia is from the herpes simplex virus (HSV) it is called herpetic whitlow. TREATMENT   When an abscess is present treatment is often incision and drainage. This means that the abscess must be cut open so the pus can get out. When this is done, the following home care instructions should be followed. HOME CARE INSTRUCTIONS    It is important to keep the affected fingers very dry. Rubber or plastic gloves over cotton gloves should be used whenever the hand must be placed in water.   Keep wound clean, dry and dressed as suggested by your caregiver between warm soaks or warm compresses.   Soak in warm water for fifteen to twenty minutes three to four times per  day for bacterial infections. Fungal infections are very difficult to treat, so often require treatment for long periods of time.   For bacterial (germ) infections take antibiotics (medicine which kill germs) as directed and finish the prescription, even if the problem appears to be solved before the medicine is gone.   Only take over-the-counter or prescription medicines for pain, discomfort, or fever as directed by your caregiver.  SEEK IMMEDIATE MEDICAL CARE IF:  You have redness, swelling, or increasing pain in the wound.   You notice pus coming from the wound.   You have a fever.   You notice a bad smell coming from the wound or dressing.  Document Released: 10/15/2000 Document Revised: 07/14/2011 Document Reviewed: 06/16/2008 [facility] Patient Information 2013 [place], [place].

## 2012-05-28 ENCOUNTER — Ambulatory Visit
Admission: RE | Admit: 2012-05-28 | Discharge: 2012-05-28 | Disposition: A | Discharge status: Home / Self Care | Payer: BC Managed Care – PPO | Source: Ambulatory Visit | Attending: Obstetrics and Gynecology | Admitting: Obstetrics and Gynecology

## 2012-05-28 DIAGNOSIS — Z803 Family history of malignant neoplasm of breast: Secondary | ICD-10-CM

## 2012-05-28 DIAGNOSIS — Z1231 Encounter for screening mammogram for malignant neoplasm of breast: Secondary | ICD-10-CM

## 2012-05-28 MED ORDER — GADOBENATE DIMEGLUMINE 529 MG/ML IV SOLN
20.0000 mL | Freq: Once | INTRAVENOUS | Status: AC | PRN
  Administered 2012-05-28: 20 mL via INTRAVENOUS

## 2012-05-31 ENCOUNTER — Other Ambulatory Visit (INDEPENDENT_AMBULATORY_CARE_PROVIDER_SITE_OTHER): Payer: BC Managed Care – PPO

## 2012-05-31 DIAGNOSIS — Z0000 Encounter for general adult medical examination without abnormal findings: Secondary | ICD-10-CM

## 2012-05-31 LAB — LIPID PANEL
Cholesterol: 230 mg/dL — ABNORMAL HIGH (ref 0–200)
HDL: 33 mg/dL — ABNORMAL LOW (ref >39.00)
Total CHOL/HDL Ratio: 7
Triglycerides: 222 mg/dL — ABNORMAL HIGH (ref 0.0–149.0)

## 2012-05-31 LAB — HEPATIC FUNCTION PANEL: Total Bilirubin: 0.6 mg/dL (ref 0.3–1.2)

## 2012-05-31 LAB — CBC WITH DIFFERENTIAL/PLATELET
Basophils Absolute: 0 10*3/uL (ref 0.0–0.1)
HCT: 46.5 % — ABNORMAL HIGH (ref 36.0–46.0)
Lymphs Abs: 1.9 10*3/uL (ref 0.7–4.0)
MCV: 90.3 fl (ref 78.0–100.0)
Monocytes Absolute: 0.4 10*3/uL (ref 0.1–1.0)
Platelets: 247 10*3/uL (ref 150.0–400.0)
RDW: 13.5 % (ref 11.5–14.6)

## 2012-05-31 LAB — TSH: TSH: 1.79 u[IU]/mL (ref 0.35–5.50)

## 2012-05-31 LAB — POCT URINALYSIS DIPSTICK
Blood, UA: NEGATIVE
Glucose, UA: NEGATIVE
Spec Grav, UA: >=1.03
Urobilinogen, UA: 0.2

## 2012-05-31 LAB — BASIC METABOLIC PANEL
BUN: 13 mg/dL (ref 6–23)
Chloride: 104 mEq/L (ref 96–112)
GFR: 64.5 mL/min (ref >60.00)
Glucose, Bld: 156 mg/dL — ABNORMAL HIGH (ref 70–99)
Potassium: 4.1 mEq/L (ref 3.5–5.1)

## 2012-06-07 ENCOUNTER — Encounter: Payer: Commercial Indemnity | Admitting: Internal Medicine

## 2012-06-11 ENCOUNTER — Encounter: Payer: Self-pay | Admitting: Internal Medicine

## 2012-06-13 ENCOUNTER — Other Ambulatory Visit: Payer: Self-pay | Admitting: Internal Medicine

## 2012-06-24 ENCOUNTER — Encounter: Payer: Commercial Indemnity | Admitting: Internal Medicine

## 2012-07-22 ENCOUNTER — Ambulatory Visit (INDEPENDENT_AMBULATORY_CARE_PROVIDER_SITE_OTHER): Payer: BC Managed Care – PPO | Admitting: Internal Medicine

## 2012-07-22 ENCOUNTER — Encounter: Payer: Self-pay | Admitting: Internal Medicine

## 2012-07-22 VITALS — BP 140/80 | HR 85 | Temp 98.2°F | Resp 18 | Ht 66.5 in | Wt 251.0 lb

## 2012-07-22 DIAGNOSIS — E669 Obesity, unspecified: Secondary | ICD-10-CM

## 2012-07-22 DIAGNOSIS — Z0000 Encounter for general adult medical examination without abnormal findings: Secondary | ICD-10-CM

## 2012-07-22 DIAGNOSIS — R7302 Impaired glucose tolerance (oral): Secondary | ICD-10-CM | POA: Insufficient documentation

## 2012-07-22 DIAGNOSIS — G9050 Complex regional pain syndrome I, unspecified: Secondary | ICD-10-CM

## 2012-07-22 DIAGNOSIS — R7309 Other abnormal glucose: Secondary | ICD-10-CM

## 2012-07-22 DIAGNOSIS — E6609 Other obesity due to excess calories: Secondary | ICD-10-CM

## 2012-07-22 MED ORDER — PHENTERMINE HCL 37.5 MG PO CAPS: 37.5000 mg | ORAL_CAPSULE | ORAL | Status: DC

## 2012-07-22 MED ORDER — DULOXETINE HCL 60 MG PO CPEP: 60.0000 mg | ORAL_CAPSULE | Freq: Every day | ORAL | Status: DC

## 2012-07-22 MED ORDER — PANTOPRAZOLE SODIUM 40 MG PO TBEC: DELAYED_RELEASE_TABLET | ORAL | Status: DC

## 2012-07-22 NOTE — Patient Instructions (Addendum)
Limit your sodium (Salt) intake    It is important that you exercise regularly, at least 20 minutes 3 to 4 times per week.  If you develop chest pain or shortness of breath seek  medical attention.  You need to lose weight.  Consider a lower calorie diet and regular exercise.DASH Diet The DASH diet stands for "Dietary Approaches to Stop Hypertension." It is a healthy eating plan that has been shown to reduce high blood pressure (hypertension) in as little as 14 days, while also possibly providing other significant health benefits. These other health benefits include reducing the risk of breast cancer after menopause and reducing the risk of type 2 diabetes, heart disease, colon cancer, and stroke. Health benefits also include weight loss and slowing kidney failure in patients with chronic kidney disease.  DIET GUIDELINES  Limit salt (sodium). Your diet should contain less than 1500 mg of sodium daily.  Limit refined or processed carbohydrates. Your diet should include mostly whole grains. Desserts and added sugars should be used sparingly.  Include small amounts of heart-healthy fats. These types of fats include nuts, oils, and tub margarine. Limit saturated and trans fats. These fats have been shown to be harmful in the body. CHOOSING FOODS  The following food groups are based on a 2000 calorie diet. See your Registered Dietitian for individual calorie needs. Grains and Grain Products (6 to 8 servings daily)  Eat More Often: Whole-wheat bread, brown rice, whole-grain or wheat pasta, quinoa, popcorn without added fat or salt (air popped).  Eat Less Often: White bread, white pasta, white rice, cornbread. Vegetables (4 to 5 servings daily)  Eat More Often: Fresh, frozen, and canned vegetables. Vegetables may be raw, steamed, roasted, or grilled with a minimal amount of fat.  Eat Less Often/Avoid: Creamed or fried vegetables. Vegetables in a cheese sauce. Fruit (4 to 5 servings daily)  Eat  More Often: All fresh, canned (in natural juice), or frozen fruits. Dried fruits without added sugar. One hundred percent fruit juice ( cup [237 mL] daily).  Eat Less Often: Dried fruits with added sugar. Canned fruit in light or heavy syrup. Lean Meats, Fish, and Poultry (2 servings or less daily. One serving is 3 to 4 oz [85-114 g]).  Eat More Often: Ninety percent or leaner ground beef, tenderloin, sirloin. Round cuts of beef, chicken breast, turkey breast. All fish. Grill, bake, or broil your meat. Nothing should be fried.  Eat Less Often/Avoid: Fatty cuts of meat, turkey, or chicken leg, thigh, or wing. Fried cuts of meat or fish. Dairy (2 to 3 servings)  Eat More Often: Low-fat or fat-free milk, low-fat plain or light yogurt, reduced-fat or part-skim cheese.  Eat Less Often/Avoid: Milk (whole, 2%).Whole milk yogurt. Full-fat cheeses. Nuts, Seeds, and Legumes (4 to 5 servings per week)  Eat More Often: All without added salt.  Eat Less Often/Avoid: Salted nuts and seeds, canned beans with added salt. Fats and Sweets (limited)  Eat More Often: Vegetable oils, tub margarines without trans fats, sugar-free gelatin. Mayonnaise and salad dressings.  Eat Less Often/Avoid: Coconut oils, palm oils, butter, stick margarine, cream, half and half, cookies, candy, pie. FOR MORE INFORMATION The Dash Diet Eating Plan: www.dashdiet.org Document Released: 04/10/2011 Document Revised: 07/14/2011 Document Reviewed: 04/10/2011 [employer] Patient Information 2013 [employer].  

## 2012-07-22 NOTE — Progress Notes (Signed)
Subjective:    Patient ID: Haley Jenkins, female    DOB: July 06, 1950, 62 y.o.   MRN: [id number]  HPI  Wt Readings from Last 3 Encounters:  07/22/12 251 lb (113.853 kg)  03/08/12 245 lb (111.131 kg)  06/05/11 236 lb (107.049 kg)    Review of Systems     Objective:   Physical Exam        Assessment & Plan:   Subjective:    Patient ID: Haley Jenkins, female    DOB: June 12, 1950, 62 y.o.   MRN: [id number]  62 -year-old patient who is seen today for an annual physical. She has seen by GI and is seen annually by gynecology. She has a history of reflex sympathetic dystrophy and has  had radiofrequency ablation of  the left lumbar and sacral nerve roots. . Clinically she is doing well. She does have a history of B12 deficiency which has been treated with weekly nasal supplement. There has been some steady weight gain and she is requesting a prescription for phentermine.   Allergies (verified):  No Known Drug Allergies   Past History:  Past Medical History:  Current Problems:  REFLEX SYMPATHETIC DYSTROPHY (ICD-337.20)  ARTHRITIS (ICD-716.90)  LACTOSE INTOLERANCE (ICD-271.3)  IRRITABLE BOWEL SYNDROME (ICD-564.1)  EXTERNAL HEMORRHOIDS (ICD-455.3)  GASTRIC POLYP (ICD-211.1)  GERD  Hiatal hernia   Past Surgical History:  knee surgery  Tubal Ligation  Breast surgery cluster cyst removed X2  Sinus surgery 1984  history of cleft palate repaired at [facility]  EGD 2009  colonoscopy 2004   Family History:  Reviewed history from 02/05/2009 and no changes required.  Family History of Breast Cancer: sister-age 88  Family History of Ovarian Cancer: Mother  Family History of Colon Cancer: aunt  Family History of Heart Disease: MGF  Family History of Irritable Bowel Syndrome: daughter  Family History of Kidney Disease: mother  Lung Cancer: Father, Age 26, COPD  Mother: CAD, solitary kidney,osteoporosis sister with bilateral breast cancer at age 62 maternal grandmother  with breast cancer 4 sisters, 1 brother- breast ca   Negative genetic testing (BRCA)  Social History:  Reviewed history from 01/18/2008 and no changes required.  Married, 2 girls  Occupation: Audiological scientist  Patient has never smoked.  Alcohol Use - no  Illicit Drug Use - no    Past Medical History  Diagnosis Date  . Arthritis   . Lactose intolerance   . Irritable bowel syndrome   . External hemorrhoid   . Gastric polyp   . GERD (gastroesophageal reflux disease)   . Hiatal hernia     History   Social History  . Marital Status: Married    Spouse Name: N/A    Number of Children: N/A  . Years of Education: N/A   Occupational History  . Not on file.   Social History Main Topics  . Smoking status: Never Smoker   . Smokeless tobacco: Never Used  . Alcohol Use: Yes     rare   . Drug Use: Not on file  . Sexually Active: Not on file   Other Topics Concern  . Not on file   Social History Narrative  . No narrative on file    Past Surgical History  Procedure Date  . Knee surgery   . Tubal ligation   . Breast surgery cluster cyst removed     x 2  . Sinus surgery 1984  . Cleft palate repaired     at [facility]  . Esophagogastroduodenoscopy  2009  . Colonoscopy 2004    Family History  Problem Relation Age of Onset  . Cancer Mother     ovarian   . Kidney disease Mother   . Osteoporosis Mother   . Cancer Father     lung  . COPD Father   . Cancer Sister 30    breast   . Irritable bowel syndrome Daughter   . Heart disease Maternal Grandfather   . Cancer Other     colon    Not on File  Current Outpatient Prescriptions on File Prior to Visit  Medication Sig Dispense Refill  . Cyanocobalamin (NASCOBAL) 500 MCG/0.1ML SOLN One spray, one nostril, once a week........Marland Kitchen MUST HAVE OFFICE VISIT  1 Bottle  0  . pantoprazole (PROTONIX) 40 MG tablet TAKE ONE TABLET BY MOUTH EVERY DAY  60 tablet  0    BP 128/86  Pulse 109  Temp(Src) 98.5 F (36.9 C) (Oral)   Ht 5' 7.5" (1.715 m)  Wt 236 lb (107.049 kg)  BMI 36.42 kg/m2  SpO2 95%       Review of Systems  Constitutional: Negative.   HENT: Negative for hearing loss, congestion, sore throat, rhinorrhea, dental problem, sinus pressure and tinnitus.   Eyes: Negative for pain, discharge and visual disturbance.  Respiratory: Negative for cough and shortness of breath.   Cardiovascular: Negative for chest pain, palpitations and leg swelling.  Gastrointestinal: Negative for nausea, vomiting, abdominal pain, diarrhea, constipation, blood in stool and abdominal distention.  Genitourinary: Negative for dysuria, urgency, frequency, hematuria, flank pain, vaginal bleeding, vaginal discharge, difficulty urinating, vaginal pain and pelvic pain.  Musculoskeletal: Positive for back pain and gait problem. Negative for joint swelling and arthralgias.  Skin: Negative for rash.  Neurological: Negative for dizziness, syncope, speech difficulty, weakness, numbness and headaches.  Hematological: Negative for adenopathy.  Psychiatric/Behavioral: Negative for behavioral problems, dysphoric mood and agitation. The patient is not nervous/anxious.        Objective:   Physical Exam  Constitutional: She is oriented to person, place, and time. She appears well-developed and well-nourished.  HENT:  Head: Normocephalic and atraumatic.  Right Ear: External ear normal.  Left Ear: External ear normal.  Mouth/Throat: Oropharynx is clear and moist.  Eyes: Conjunctivae and EOM are normal.  Neck: Normal range of motion. Neck supple. No JVD present. No thyromegaly present.  Cardiovascular: Normal rate, regular rhythm, normal heart sounds and intact distal pulses.   No murmur heard. Pulmonary/Chest: Effort normal and breath sounds normal. She has no wheezes. She has no rales.  Abdominal: Soft. Bowel sounds are normal. She exhibits no distension and no mass. There is no tenderness. There is no rebound and no guarding.   Musculoskeletal: Normal range of motion. She exhibits no edema and no tenderness.  Neurological: She is alert and oriented to person, place, and time. She has normal reflexes. No cranial nerve deficit. She exhibits normal muscle tone. Coordination normal.       Laboratory sensation and monofilament testing decreased on the left. Left patellar reflex was diminished  Skin: Skin is warm and dry. No rash noted.  Psychiatric: She has a normal mood and affect. Her behavior is normal.          Assessment & Plan:   Preventive health examination Reflex sympathetic dystrophy B12 deficiency. Options were discussed we'll continue  monthly injections  Exogenous obesity. Will give a call of phentermine and reassess in 3 months Impaired glucose tolerance. We'll check a hemoglobin A1c. Diet lifestyle exercise  all discussed. Will reassess in 3 months

## 2012-09-13 ENCOUNTER — Other Ambulatory Visit: Payer: Self-pay | Admitting: Internal Medicine

## 2012-12-03 ENCOUNTER — Encounter: Payer: Self-pay | Admitting: Gastroenterology

## 2013-03-03 ENCOUNTER — Other Ambulatory Visit: Payer: Self-pay | Admitting: Internal Medicine

## 2013-03-10 ENCOUNTER — Other Ambulatory Visit: Payer: Self-pay

## 2013-05-05 DIAGNOSIS — Z87442 Personal history of urinary calculi: Secondary | ICD-10-CM

## 2013-05-05 DIAGNOSIS — N200 Calculus of kidney: Secondary | ICD-10-CM | POA: Insufficient documentation

## 2013-05-05 HISTORY — DX: Personal history of urinary calculi: Z87.442

## 2013-05-23 ENCOUNTER — Other Ambulatory Visit: Payer: Self-pay

## 2013-05-23 DIAGNOSIS — Z1231 Encounter for screening mammogram for malignant neoplasm of breast: Secondary | ICD-10-CM

## 2013-05-30 ENCOUNTER — Encounter: Payer: Self-pay | Admitting: Obstetrics & Gynecology

## 2013-05-31 ENCOUNTER — Ambulatory Visit (INDEPENDENT_AMBULATORY_CARE_PROVIDER_SITE_OTHER): Payer: BC Managed Care – PPO | Admitting: Obstetrics & Gynecology

## 2013-05-31 ENCOUNTER — Ambulatory Visit: Payer: Self-pay | Admitting: Obstetrics and Gynecology

## 2013-05-31 ENCOUNTER — Encounter: Payer: Self-pay | Admitting: Obstetrics & Gynecology

## 2013-05-31 VITALS — BP 158/82 | HR 60 | Resp 20 | Ht 66.75 in | Wt 230.0 lb

## 2013-05-31 DIAGNOSIS — Z8041 Family history of malignant neoplasm of ovary: Secondary | ICD-10-CM

## 2013-05-31 DIAGNOSIS — M810 Age-related osteoporosis without current pathological fracture: Secondary | ICD-10-CM

## 2013-05-31 DIAGNOSIS — Z124 Encounter for screening for malignant neoplasm of cervix: Secondary | ICD-10-CM

## 2013-05-31 DIAGNOSIS — Z01419 Encounter for gynecological examination (general) (routine) without abnormal findings: Secondary | ICD-10-CM

## 2013-05-31 MED ORDER — CEPHALEXIN 500 MG PO CAPS: 500.0000 mg | ORAL_CAPSULE | Freq: Four times a day (QID) | ORAL | Status: DC

## 2013-05-31 NOTE — Progress Notes (Signed)
63 y.o. [id number] MarriedCaucasianF here for annual exam.  Patient healthy.  No new issues.  Strong family hx of breast cancer--sister age 85, MGM, maternal aunt age 9, [place], and mother with ovarian cancer.  Negative genetic testing BRCA 1/2 and negative BART testing.  Patient had a MRI 1/14 that was negative.    Sees Dr. Inda Merlin.  Has appt in March.  Labs are scheduled.    Patient's last menstrual period was 05/05/1996.          Sexually active: yes  The current method of family planning is tubal ligation.    Exercising: no  not regularly Smoker:  no  Health Maintenance: Pap:  06/07/10 WNL History of abnormal Pap:  no MMG:  05/28/12 mmg and MRI-normal Colonoscopy:  02/07/08, negative, BE 2012 due to diarrhea, repeat in 5 years BMD:   6 years ago TDaP:  2013 Screening Labs: PCP, Hb today: PCP, Urine today: PCP   reports that she has never smoked. She has never used smokeless tobacco. She reports that she does not drink alcohol or use illicit drugs.  Past Medical History  Diagnosis Date  . Arthritis   . Lactose intolerance   . Irritable bowel syndrome   . External hemorrhoid   . Gastric polyp   . GERD (gastroesophageal reflux disease)   . Hiatal hernia   . Cleft palate     repaired at birth  . RSD (reflex sympathetic dystrophy)   . BRCA negative 2/13    1, 2 negative-BART negative 09/10/11    Past Surgical History  Procedure Laterality Date  . Knee surgery    . Tubal ligation    . Breast surgery cluster cyst removed      x 2  . Sinus surgery  1984  . Cleft palate repaired      at [facility]  . Esophagogastroduodenoscopy  2009  . Colonoscopy  2004  . Hernia repair    . Urethral sling  2013    mid-urethral sling    Current Outpatient Prescriptions  Medication Sig Dispense Refill  . aspirin 81 MG tablet Take 81 mg by mouth daily.       . B-D TB SYRINGE 1CC/25GX5/8" 25G X 5/8" 1 ML MISC USE AS DIRECTED  21 each  1  . Calcium 1500 MG tablet Take 1,500 mg by mouth.       . Cholecalciferol (VITAMIN D) 2000 UNITS CAPS Take 2 capsules by mouth 2 (two) times daily.      . cyanocobalamin (,VITAMIN B-12,) 1000 MCG/ML injection INJECT 1 ML (1000 MCG TOTAL) INTO THE MUSCLE EVERY 30 DAYS  30 mL  0  . ESZOPICLONE 3 MG tablet       . fluticasone (FLONASE) 50 MCG/ACT nasal spray PLACE TWO SPRAYS INTO THE NOSE DAILY  16 g  2  . lidocaine (LIDODERM) 5 % Place 1 patch onto the skin daily. Remove & Discard patch within 12 hours or as directed by MD      . Ernestine Conrad 3-6-9 Fatty Acids (OMEGA 3-6-9 COMPLEX PO) Take by mouth daily.      Marland Kitchen oxymorphone (OPANA ER) 10 MG 12 hr tablet Take 15 mg by mouth every 12 (twelve) hours.       Marland Kitchen oxymorphone (OPANA) 5 MG tablet Take 5 mg by mouth every 4 (four) hours as needed for pain.      . pantoprazole (PROTONIX) 40 MG tablet TAKE ONE TABLET BY MOUTH EVERY DAY  90 tablet  3  .  promethazine (PHENERGAN) 25 MG suppository Place 25 mg rectally every 6 (six) hours as needed for nausea.      Marland Kitchen SYRINGE-NEEDLE, DISP, 3 ML 25G X 5/8" 3 ML MISC Every 30 days.  12 each  1   No current facility-administered medications for this visit.    Family History  Problem Relation Age of Onset  . Cancer Mother     ovarian   . Kidney disease Mother   . Osteoporosis Mother   . Cancer Father     lung  . COPD Father   . Cancer Sister 20    breast   . Irritable bowel syndrome Daughter   . Heart disease Maternal Grandfather   . Cancer Other     colon  . Leukemia Maternal Grandmother   . Heart attack Mother     pacemaker  . Other Mother 7    meningioma-removed on 05/26/13    ROS:  Pertinent items are noted in HPI.  Otherwise, a comprehensive ROS was negative.  Exam:   BP 158/82  Pulse 60  Resp 20  Ht 5' 6.75" (1.695 m)  Wt 230 lb (104.327 kg)  BMI 36.31 kg/m2  LMP 05/05/1996  Weight change: -17#  Height: 5' 6.75" (169.5 cm)  Ht Readings from Last 3 Encounters:  05/31/13 5' 6.75" (1.695 m)  07/22/12 5' 6.5" (1.689 m)  06/05/11 5' 7.5" (1.715  m)    General appearance: alert, cooperative and appears stated age Head: Normocephalic, without obvious abnormality, atraumatic Neck: no adenopathy, supple, symmetrical, trachea midline and thyroid normal to inspection and palpation Lungs: clear to auscultation bilaterally Breasts: normal appearance, no masses or tenderness Heart: regular rate and rhythm Abdomen: soft, non-tender; bowel sounds normal; no masses,  no organomegaly Extremities: extremities normal, atraumatic, no cyanosis or edema Skin: Skin color, texture, turgor normal. No rashes or lesions Lymph nodes: Cervical, supraclavicular, and axillary nodes normal. No abnormal inguinal nodes palpated Neurologic: Grossly normal   Pelvic: External genitalia:  no lesions              Urethra:  normal appearing urethra with no masses, tenderness or lesions              Bartholins and Skenes: normal                 Vagina: normal appearing vagina with normal color and discharge, no lesions              Cervix: no lesions              Pap taken: yes Bimanual Exam:  Uterus:  normal size, contour, position, consistency, mobility, non-tender              Adnexa: normal adnexa and no mass, fullness, tenderness               Rectovaginal: Confirms               Anus:  normal sphincter tone, no lesions  A:  Well Woman with normal exam Strong family hx of breast cancer--four family members Mother with ovarian cancer. Weight loss--intentional Recurrent vulvar boils Keflex 540m qid x 7 days.  Skin culture given to patient if boil ruptures.  P:   Mammogram D/W pt.  Will do 3D this year.  Plan breast MRI next year.  Breast cancer risk 38.9%. pap smear with HR HPV Ca-125 and TVUS return annually or prn  An After Visit Summary was printed and given to the patient.

## 2013-05-31 NOTE — Addendum Note (Signed)
Addended by: Michele Mcalpine on: 05/31/2013 04:41 PM   Modules accepted: Orders

## 2013-05-31 NOTE — Progress Notes (Signed)
PUS Scheduled for 06/02/13 with Dr. Sabra Heck 3D MMG with BMD scheduled at [facility] for 06/10/13 at 3:00. Patient agreeable to time/date/location.

## 2013-05-31 NOTE — Patient Instructions (Signed)

## 2013-06-01 LAB — CA 125: CA 125: 8.3 U/mL (ref 0.0–30.2)

## 2013-06-02 ENCOUNTER — Ambulatory Visit (INDEPENDENT_AMBULATORY_CARE_PROVIDER_SITE_OTHER): Payer: BC Managed Care – PPO

## 2013-06-02 ENCOUNTER — Ambulatory Visit (INDEPENDENT_AMBULATORY_CARE_PROVIDER_SITE_OTHER): Payer: BC Managed Care – PPO | Admitting: Obstetrics & Gynecology

## 2013-06-02 VITALS — BP 150/86 | Ht 66.75 in | Wt 230.0 lb

## 2013-06-02 DIAGNOSIS — D219 Benign neoplasm of connective and other soft tissue, unspecified: Secondary | ICD-10-CM

## 2013-06-02 DIAGNOSIS — D259 Leiomyoma of uterus, unspecified: Secondary | ICD-10-CM

## 2013-06-02 DIAGNOSIS — Z8041 Family history of malignant neoplasm of ovary: Secondary | ICD-10-CM

## 2013-06-02 NOTE — Progress Notes (Signed)
63 y.o.Marriedfemale here for a pelvic ultrasound.  Mother died of ovarian cancer.  Ca-125 was normal at [facility].  Reviewed with patient.   Patient's last menstrual period was 05/05/1996.  Sexually active:  yes  Contraception: PMP  FINDINGS: UTERUS: 8.1 x 5.3 x 5.2cm with 1.9 and 1.4cm fibroids, cervix with small polyp-44mm EMS: 3.79mm ADNEXA:   Left ovary 2.7 x 1.1 x 1.5cm   Right ovary 1.3 x 0.9 x 2.0cm CUL DE SAC: no free fluid  Images reviewed with patient.  Patient with known hx of fibroids.  No ultrasounds in paper chart or EPIC for comparison but as they are small, I do not feel these need to be monitored except via physical exam and repeat u/s in 2 years.     Endocervical polyp present.  Pt without bleeding.  Will remove if prolapses or if pt has bleeding issues.  Assessment:  Uterine fibroids, family hx of ovarian cancer, probable small 18mm endocervical polyp Plan: [facility] 1 year  ~15 minutes spent with patient >50% of time was in face to face discussion of above.

## 2013-06-03 ENCOUNTER — Telehealth: Payer: Self-pay | Admitting: Emergency Medicine

## 2013-06-03 ENCOUNTER — Encounter: Payer: Self-pay | Admitting: Obstetrics & Gynecology

## 2013-06-03 ENCOUNTER — Other Ambulatory Visit: Payer: Self-pay | Admitting: Obstetrics & Gynecology

## 2013-06-03 DIAGNOSIS — N764 Abscess of vulva: Secondary | ICD-10-CM

## 2013-06-03 LAB — IPS PAP TEST WITH HPV

## 2013-06-03 NOTE — Telephone Encounter (Signed)
Patient walked in with specimen to office.

## 2013-06-03 NOTE — Progress Notes (Signed)
Patient brought in culture of vulvar abscess that ruptured after starting her on Keflex.  Order placed and released.

## 2013-06-06 ENCOUNTER — Ambulatory Visit: Payer: BC Managed Care – PPO

## 2013-06-06 LAB — WOUND CULTURE: Organism ID, Bacteria: NORMAL

## 2013-06-17 ENCOUNTER — Other Ambulatory Visit: Payer: BC Managed Care – PPO

## 2013-06-17 ENCOUNTER — Ambulatory Visit: Payer: BC Managed Care – PPO

## 2013-07-06 ENCOUNTER — Ambulatory Visit
Admission: RE | Admit: 2013-07-06 | Discharge: 2013-07-06 | Disposition: A | Discharge status: Home / Self Care | Payer: Self-pay | Source: Ambulatory Visit | Attending: Obstetrics & Gynecology | Admitting: Obstetrics & Gynecology

## 2013-07-06 ENCOUNTER — Ambulatory Visit
Admission: RE | Admit: 2013-07-06 | Discharge: 2013-07-06 | Disposition: A | Discharge status: Home / Self Care | Payer: Self-pay | Source: Ambulatory Visit

## 2013-07-06 DIAGNOSIS — M810 Age-related osteoporosis without current pathological fracture: Secondary | ICD-10-CM

## 2013-07-06 DIAGNOSIS — Z1231 Encounter for screening mammogram for malignant neoplasm of breast: Secondary | ICD-10-CM

## 2013-07-07 ENCOUNTER — Encounter: Payer: Self-pay | Admitting: Obstetrics & Gynecology

## 2013-07-11 ENCOUNTER — Other Ambulatory Visit: Payer: Self-pay | Admitting: Internal Medicine

## 2013-07-14 ENCOUNTER — Other Ambulatory Visit: Payer: Self-pay | Admitting: Internal Medicine

## 2013-07-14 ENCOUNTER — Encounter: Payer: Self-pay | Admitting: Internal Medicine

## 2013-07-15 MED ORDER — CYANOCOBALAMIN 1000 MCG/ML IJ SOLN: INTRAMUSCULAR | Status: DC

## 2013-07-22 ENCOUNTER — Other Ambulatory Visit (INDEPENDENT_AMBULATORY_CARE_PROVIDER_SITE_OTHER): Payer: BC Managed Care – PPO

## 2013-07-22 DIAGNOSIS — Z0000 Encounter for general adult medical examination without abnormal findings: Secondary | ICD-10-CM

## 2013-07-22 LAB — POCT URINALYSIS DIPSTICK
Bilirubin, UA: NEGATIVE
Blood, UA: NEGATIVE
Glucose, UA: NEGATIVE
Nitrite, UA: NEGATIVE
Spec Grav, UA: >=1.03
Urobilinogen, UA: 0.2
pH, UA: 5.5

## 2013-07-22 LAB — BASIC METABOLIC PANEL
BUN: 20 mg/dL (ref 6–23)
CO2: 27 meq/L (ref 19–32)
CREATININE: 0.9 mg/dL (ref 0.4–1.2)
Calcium: 9.2 mg/dL (ref 8.4–10.5)
Chloride: 104 mEq/L (ref 96–112)
GFR: 65.87 mL/min (ref >60.00)
Glucose, Bld: 96 mg/dL (ref 70–99)
Potassium: 3.4 mEq/L — ABNORMAL LOW (ref 3.5–5.1)
Sodium: 140 mEq/L (ref 135–145)

## 2013-07-22 LAB — CBC WITH DIFFERENTIAL/PLATELET
BASOS PCT: 0.5 % (ref 0.0–3.0)
Basophils Absolute: 0 10*3/uL (ref 0.0–0.1)
EOS ABS: 0.1 10*3/uL (ref 0.0–0.7)
Eosinophils Relative: 1.8 % (ref 0.0–5.0)
HCT: 43.7 % (ref 36.0–46.0)
Hemoglobin: 14.9 g/dL (ref 12.0–15.0)
Lymphocytes Relative: 31.9 % (ref 12.0–46.0)
Lymphs Abs: 1.8 10*3/uL (ref 0.7–4.0)
MCHC: 34 g/dL (ref 30.0–36.0)
MCV: 91.3 fl (ref 78.0–100.0)
MONO ABS: 0.4 10*3/uL (ref 0.1–1.0)
Monocytes Relative: 6.6 % (ref 3.0–12.0)
NEUTROS PCT: 59.2 % (ref 43.0–77.0)
Neutro Abs: 3.4 10*3/uL (ref 1.4–7.7)
PLATELETS: 212 10*3/uL (ref 150.0–400.0)
RBC: 4.79 Mil/uL (ref 3.87–5.11)
RDW: 13.6 % (ref 11.5–14.6)
WBC: 5.7 10*3/uL (ref 4.5–10.5)

## 2013-07-22 LAB — HEPATIC FUNCTION PANEL
ALT: 15 U/L (ref 0–35)
AST: 12 U/L (ref 0–37)
Albumin: 4.5 g/dL (ref 3.5–5.2)
Alkaline Phosphatase: 78 U/L (ref 39–117)
BILIRUBIN TOTAL: 0.8 mg/dL (ref 0.3–1.2)
Bilirubin, Direct: 0.1 mg/dL (ref 0.0–0.3)
TOTAL PROTEIN: 7.1 g/dL (ref 6.0–8.3)

## 2013-07-22 LAB — TSH: TSH: 2.31 u[IU]/mL (ref 0.35–5.50)

## 2013-07-22 LAB — LIPID PANEL
Cholesterol: 223 mg/dL — ABNORMAL HIGH (ref 0–200)
HDL: 39.1 mg/dL (ref >39.00)
LDL Cholesterol: 151 mg/dL — ABNORMAL HIGH (ref 0–99)
Total CHOL/HDL Ratio: 6
Triglycerides: 165 mg/dL — ABNORMAL HIGH (ref 0.0–149.0)
VLDL: 33 mg/dL (ref 0.0–40.0)

## 2013-07-29 ENCOUNTER — Ambulatory Visit (INDEPENDENT_AMBULATORY_CARE_PROVIDER_SITE_OTHER): Payer: BC Managed Care – PPO | Admitting: Internal Medicine

## 2013-07-29 ENCOUNTER — Encounter: Payer: Self-pay | Admitting: Internal Medicine

## 2013-07-29 VITALS — BP 120/82 | Temp 99.2°F | Wt 232.0 lb

## 2013-07-29 DIAGNOSIS — R7302 Impaired glucose tolerance (oral): Secondary | ICD-10-CM

## 2013-07-29 DIAGNOSIS — M129 Arthropathy, unspecified: Secondary | ICD-10-CM

## 2013-07-29 DIAGNOSIS — R7309 Other abnormal glucose: Secondary | ICD-10-CM

## 2013-07-29 DIAGNOSIS — Z0000 Encounter for general adult medical examination without abnormal findings: Secondary | ICD-10-CM

## 2013-07-29 DIAGNOSIS — G9050 Complex regional pain syndrome I, unspecified: Secondary | ICD-10-CM

## 2013-07-29 DIAGNOSIS — K589 Irritable bowel syndrome without diarrhea: Secondary | ICD-10-CM

## 2013-07-29 DIAGNOSIS — K219 Gastro-esophageal reflux disease without esophagitis: Secondary | ICD-10-CM

## 2013-07-29 NOTE — Progress Notes (Signed)
Pre visit review using our clinic review tool, if applicable. No additional management support is needed unless otherwise documented below in the visit note. 

## 2013-07-29 NOTE — Patient Instructions (Signed)
It is important that you exercise regularly, at least 20 minutes 3 to 4 times per week.  If you develop chest pain or shortness of breath seek  medical attention.  You need to lose weight.  Consider a lower calorie diet and regular exercise.  Return in one year for follow-up   

## 2013-07-29 NOTE — Progress Notes (Signed)
Subjective:    Patient ID: Haley Jenkins, female    DOB: 09/14/1950, 63 y.o.   MRN: [id number]  HPI   Wt Readings from Last 3 Encounters:  07/29/13 232 lb (105.235 kg)  06/02/13 230 lb (104.327 kg)  05/31/13 230 lb (104.327 kg)    Review of Systems  See below    Objective:   Physical Exam   See below      Assessment & Plan:  See below Subjective:    Patient ID: Haley Jenkins, female    DOB: 03/19/1951, 63 y.o.   MRN: [id number]  63  -year-old patient who is seen today for an annual physical. She has seen by GI and is seen annually by gynecology. She has a history of reflex sympathetic dystrophy and has  had radiofrequency ablation of  the left lumbar and sacral nerve roots. . Clinically she is doing well. She does have a history of B12 deficiency which has been treated with weekly nasal supplement.   Allergies (verified):  No Known Drug Allergies   Past History:    Current Problems:  REFLEX SYMPATHETIC DYSTROPHY (ICD-337.20)  ARTHRITIS (ICD-716.90)  LACTOSE INTOLERANCE (ICD-271.3)  IRRITABLE BOWEL SYNDROME (ICD-564.1)  EXTERNAL HEMORRHOIDS (ICD-455.3)  GASTRIC POLYP (ICD-211.1)  GERD  Hiatal hernia  B12 deficiency  Past Surgical History:  knee surgery  Tubal Ligation  Breast surgery cluster cyst removed X2  Sinus surgery 1984  history of cleft palate repaired at [facility]  EGD 2009  colonoscopy 2004  2009  Family History:   Family History of Breast Cancer: sister-age 41  Family History of Ovarian Cancer: Mother  Family History of Colon Cancer: aunt  Family History of Heart Disease: MGF  Family History of Irritable Bowel Syndrome: daughter  Family History of Kidney Disease: mother  Lung Cancer: Father, Age 77, COPD  Mother: CAD, solitary kidney,osteoporosis sister with bilateral breast cancer at age 41 maternal grandmother with breast cancer 4 sisters, 1 brother- breast ca   Negative genetic testing (BRCA)  Social History:   Married,  2 girls  Occupation: accounting  Patient has never smoked.  Alcohol Use - no  Illicit Drug Use - no    Past Medical History  Diagnosis Date  . Arthritis   . Lactose intolerance   . Irritable bowel syndrome   . External hemorrhoid   . Gastric polyp   . GERD (gastroesophageal reflux disease)   . Hiatal hernia     History   Social History  . Marital Status: Married    Spouse Name: N/A    Number of Children: N/A  . Years of Education: N/A   Occupational History  . Not on file.   Social History Main Topics  . Smoking status: Never Smoker   . Smokeless tobacco: Never Used  . Alcohol Use: Yes     rare   . Drug Use: Not on file  . Sexually Active: Not on file   Other Topics Concern  . Not on file   Social History Narrative  . No narrative on file    Past Surgical History  Procedure Date  . Knee surgery   . Tubal ligation   . Breast surgery cluster cyst removed     x 2  . Sinus surgery 1984  . Cleft palate repaired     at [facility]  . Esophagogastroduodenoscopy 2009  . Colonoscopy 2004    Family History  Problem Relation Age of Onset  . Cancer Mother       ovarian   . Kidney disease Mother   . Osteoporosis Mother   . Cancer Father     lung  . COPD Father   . Cancer Sister 27    breast   . Irritable bowel syndrome Daughter   . Heart disease Maternal Grandfather   . Cancer Other     colon    Not on File  Current Outpatient Prescriptions on File Prior to Visit  Medication Sig Dispense Refill  . Cyanocobalamin (NASCOBAL) 500 MCG/0.1ML SOLN One spray, one nostril, once a week........Marland Kitchen MUST HAVE OFFICE VISIT  1 Bottle  0  . pantoprazole (PROTONIX) 40 MG tablet TAKE ONE TABLET BY MOUTH EVERY DAY  60 tablet  0    BP 128/86  Pulse 109  Temp(Src) 98.5 F (36.9 C) (Oral)  Ht 5' 7.5" (1.715 m)  Wt 236 lb (107.049 kg)  BMI 36.42 kg/m2  SpO2 95%       Review of Systems  Constitutional: Negative.   HENT: Negative for hearing loss,  congestion, sore throat, rhinorrhea, dental problem, sinus pressure and tinnitus.   Eyes: Negative for pain, discharge and visual disturbance.  Respiratory: Negative for cough and shortness of breath.   Cardiovascular: Negative for chest pain, palpitations and leg swelling.  Gastrointestinal: Negative for nausea, vomiting, abdominal pain, diarrhea, constipation, blood in stool and abdominal distention.  Genitourinary: Negative for dysuria, urgency, frequency, hematuria, flank pain, vaginal bleeding, vaginal discharge, difficulty urinating, vaginal pain and pelvic pain.  Musculoskeletal: Positive for back pain and gait problem. Negative for joint swelling and arthralgias.  Skin: Negative for rash.  Neurological: Negative for dizziness, syncope, speech difficulty, weakness, numbness and headaches.  Hematological: Negative for adenopathy.  Psychiatric/Behavioral: Negative for behavioral problems, dysphoric mood and agitation. The patient is not nervous/anxious.        Objective:   Physical Exam  Constitutional: She is oriented to person, place, and time. She appears well-developed and well-nourished.  HENT:  Head: Normocephalic and atraumatic.  Right Ear: External ear normal.  Left Ear: External ear normal.  Mouth/Throat: Oropharynx is clear and moist.  Eyes: Conjunctivae and EOM are normal.  Neck: Normal range of motion. Neck supple. No JVD present. No thyromegaly present.  Cardiovascular: Normal rate, regular rhythm, normal heart sounds and intact distal pulses.   No murmur heard. Pulmonary/Chest: Effort normal and breath sounds normal. She has no wheezes. She has no rales.  Abdominal: Soft. Bowel sounds are normal. She exhibits no distension and no mass. There is no tenderness. There is no rebound and no guarding.  Musculoskeletal: Normal range of motion. She exhibits no edema and no tenderness.  Neurological: She is alert and oriented to person, place, and time. She has normal reflexes.  No cranial nerve deficit. She exhibits normal muscle tone. Coordination normal.   monofilament testing decreased on the left. Left patellar reflex was diminished  last year, but appeared to be symmetrical.  Today Skin: Skin is warm and dry. No rash noted.  Psychiatric: She has a normal mood and affect. Her behavior is normal.          Assessment & Plan:   Preventive health examination Reflex sympathetic dystrophy B12 deficiency. Options were discussed we'll continue  monthly injections  Exogenous obesity.  Impaired glucose tolerance.  Fasting blood sugar normal.  This year

## 2013-09-05 ENCOUNTER — Encounter: Payer: Self-pay | Admitting: Obstetrics & Gynecology

## 2013-09-14 ENCOUNTER — Encounter: Payer: Self-pay | Admitting: Internal Medicine

## 2013-09-14 ENCOUNTER — Other Ambulatory Visit: Payer: Self-pay | Admitting: Internal Medicine

## 2013-09-15 MED ORDER — PANTOPRAZOLE SODIUM 40 MG PO TBEC: DELAYED_RELEASE_TABLET | ORAL | Status: DC

## 2014-03-06 ENCOUNTER — Encounter: Payer: Self-pay | Admitting: Internal Medicine

## 2014-05-09 ENCOUNTER — Emergency Department (HOSPITAL_COMMUNITY)
Admission: EM | Admit: 2014-05-09 | Discharge: 2014-05-09 | Disposition: A | Discharge status: Home / Self Care | Payer: BLUE CROSS/BLUE SHIELD | Attending: Emergency Medicine | Admitting: Emergency Medicine

## 2014-05-09 ENCOUNTER — Encounter (HOSPITAL_COMMUNITY): Payer: Self-pay | Admitting: Emergency Medicine

## 2014-05-09 ENCOUNTER — Emergency Department (HOSPITAL_COMMUNITY): Payer: BLUE CROSS/BLUE SHIELD

## 2014-05-09 DIAGNOSIS — Z7982 Long term (current) use of aspirin: Secondary | ICD-10-CM | POA: Diagnosis not present

## 2014-05-09 DIAGNOSIS — Z8669 Personal history of other diseases of the nervous system and sense organs: Secondary | ICD-10-CM | POA: Diagnosis not present

## 2014-05-09 DIAGNOSIS — Z8639 Personal history of other endocrine, nutritional and metabolic disease: Secondary | ICD-10-CM | POA: Diagnosis not present

## 2014-05-09 DIAGNOSIS — R109 Unspecified abdominal pain: Secondary | ICD-10-CM | POA: Diagnosis present

## 2014-05-09 DIAGNOSIS — Z79899 Other long term (current) drug therapy: Secondary | ICD-10-CM | POA: Insufficient documentation

## 2014-05-09 DIAGNOSIS — Z86010 Personal history of colonic polyps: Secondary | ICD-10-CM | POA: Insufficient documentation

## 2014-05-09 DIAGNOSIS — Z7951 Long term (current) use of inhaled steroids: Secondary | ICD-10-CM | POA: Diagnosis not present

## 2014-05-09 DIAGNOSIS — K219 Gastro-esophageal reflux disease without esophagitis: Secondary | ICD-10-CM | POA: Diagnosis not present

## 2014-05-09 DIAGNOSIS — M1990 Unspecified osteoarthritis, unspecified site: Secondary | ICD-10-CM | POA: Insufficient documentation

## 2014-05-09 LAB — COMPREHENSIVE METABOLIC PANEL
ALK PHOS: 74 U/L (ref 39–117)
ALT: 21 U/L (ref 0–35)
AST: 21 U/L (ref 0–37)
Albumin: 4.5 g/dL (ref 3.5–5.2)
Anion gap: 9 (ref 5–15)
BILIRUBIN TOTAL: 0.5 mg/dL (ref 0.3–1.2)
BUN: 12 mg/dL (ref 6–23)
CHLORIDE: 104 meq/L (ref 96–112)
CO2: 27 mmol/L (ref 19–32)
Calcium: 9.4 mg/dL (ref 8.4–10.5)
Creatinine, Ser: 0.97 mg/dL (ref 0.50–1.10)
GFR calc Af Amer: 71 mL/min — ABNORMAL LOW (ref >90)
GFR, EST NON AFRICAN AMERICAN: 61 mL/min — AB (ref >90)
Glucose, Bld: 139 mg/dL — ABNORMAL HIGH (ref 70–99)
Potassium: 3.6 mmol/L (ref 3.5–5.1)
Sodium: 140 mmol/L (ref 135–145)
Total Protein: 7.1 g/dL (ref 6.0–8.3)

## 2014-05-09 LAB — URINE MICROSCOPIC-ADD ON

## 2014-05-09 LAB — CBC WITH DIFFERENTIAL/PLATELET
BASOS ABS: 0 10*3/uL (ref 0.0–0.1)
Basophils Relative: 0 % (ref 0–1)
EOS PCT: 0 % (ref 0–5)
Eosinophils Absolute: 0 10*3/uL (ref 0.0–0.7)
HCT: 42.6 % (ref 36.0–46.0)
Hemoglobin: 14.7 g/dL (ref 12.0–15.0)
LYMPHS ABS: 1 10*3/uL (ref 0.7–4.0)
Lymphocytes Relative: 11 % — ABNORMAL LOW (ref 12–46)
MCH: 30.6 pg (ref 26.0–34.0)
MCHC: 34.5 g/dL (ref 30.0–36.0)
MCV: 88.8 fL (ref 78.0–100.0)
Monocytes Absolute: 0.5 10*3/uL (ref 0.1–1.0)
Monocytes Relative: 5 % (ref 3–12)
NEUTROS ABS: 7.7 10*3/uL (ref 1.7–7.7)
NEUTROS PCT: 84 % — AB (ref 43–77)
PLATELETS: 216 10*3/uL (ref 150–400)
RBC: 4.8 MIL/uL (ref 3.87–5.11)
RDW: 13.3 % (ref 11.5–15.5)
WBC: 9.2 10*3/uL (ref 4.0–10.5)

## 2014-05-09 LAB — URINALYSIS, ROUTINE W REFLEX MICROSCOPIC
BILIRUBIN URINE: NEGATIVE
Glucose, UA: NEGATIVE mg/dL
Ketones, ur: 15 mg/dL — AB
Nitrite: NEGATIVE
PH: 7 (ref 5.0–8.0)
PROTEIN: NEGATIVE mg/dL
Specific Gravity, Urine: 1.012 (ref 1.005–1.030)
Urobilinogen, UA: 1 mg/dL (ref 0.0–1.0)

## 2014-05-09 NOTE — Discharge Instructions (Signed)
It was our pleasure to provide your ER care today - we hope that you feel better.  Rest. Drink plenty of fluids.  The urine tests show a few red blood cellls - a urine culture was sent the results of which will be back in 2 days time - have your doctor follow up on those results then.  Follow up with primary care doctor in the next couple days for recheck.  Return to ER if worse, new symptoms, fevers, persistent vomiting, redness/rash to area of pain, other concern.     Flank Pain Flank pain refers to pain that is located on the side of the body between the upper abdomen and the back. The pain may occur over a short period of time (acute) or may be long-term or reoccurring (chronic). It may be mild or severe. Flank pain can be caused by many things. CAUSES  Some of the more common causes of flank pain include:  Muscle strains.   Muscle spasms.   A disease of your spine (vertebral disk disease).   A lung infection (pneumonia).   Fluid around your lungs (pulmonary edema).   A kidney infection.   Kidney stones.   A very painful skin rash caused by the chickenpox virus (shingles).   Gallbladder disease.  [place] care will depend on the cause of your pain. In general,  Rest as directed by your caregiver.  Drink enough fluids to keep your urine clear or pale yellow.  Only take over-the-counter or prescription medicines as directed by your caregiver. Some medicines may help relieve the pain.  Tell your caregiver about any changes in your pain.  Follow up with your caregiver as directed. SEEK IMMEDIATE MEDICAL CARE IF:   Your pain is not controlled with medicine.   You have new or worsening symptoms.  Your pain increases.   You have abdominal pain.   You have shortness of breath.   You have persistent nausea or vomiting.   You have swelling in your abdomen.   You feel faint or pass out.   You have blood in your  urine.  You have a fever or persistent symptoms for more than 2-3 days.  You have a fever and your symptoms suddenly get worse. MAKE SURE YOU:   Understand these instructions.  Will watch your condition.  Will get help right away if you are not doing well or get worse. Document Released: 06/12/2005 Document Revised: 01/14/2012 Document Reviewed: 12/04/2011 [facility] Patient Information 2015 [place], [place]. This information is not intended to replace advice given to you by your health care provider. Make sure you discuss any questions you have with your health care provider.

## 2014-05-09 NOTE — ED Provider Notes (Signed)
CSN: [id number]     Arrival date & time 05/09/14  1347 History   First MD Initiated Contact with Patient 05/09/14 1719     Chief Complaint  Patient presents with  . Flank Pain     (Consider location/radiation/quality/duration/timing/severity/associated sxs/prior Treatment) Patient is a 64 y.o. female presenting with flank pain. The history is provided by the patient.  Flank Pain Associated symptoms include abdominal pain. Pertinent negatives include no chest pain, no headaches and no shortness of breath.  pt c/o acute right flank pain onset today at rest. Pain mod-severe. Waxed and waned in intensity. Initially right flank posteriorly and then radiated towards rlq. No hx same pain. No radicular pain/leg pain. No swelling, redness or rash to area of pain. No back or flank injury or strain. Normal appetite. No nvd. No abd distension. No hematuria or dysuria. No hx kidney stones or gallstones. Denies fever/chills.      Past Medical History  Diagnosis Date  . Arthritis   . Lactose intolerance   . Irritable bowel syndrome   . External hemorrhoid   . Gastric polyp   . GERD (gastroesophageal reflux disease)   . Hiatal hernia   . Cleft palate     repaired at birth  . RSD (reflex sympathetic dystrophy)   . BRCA negative 2/13    1, 2 negative-BART negative 09/10/11   Past Surgical History  Procedure Laterality Date  . Knee surgery Left 1976  . Tubal ligation  1987  . Breast surgery cluster cyst removed      x 2  . Cleft palate repaired  birth, 70    at [facility]  . Urethral sling  11/13    mid-urethral sling  . Incisional breast biopsy  1997, 2003   Family History  Problem Relation Age of Onset  . Cancer Mother     ovarian   . Kidney disease Mother   . Osteoporosis Mother   . Cancer Father     lung  . COPD Father   . Cancer Sister 58    breast   . Irritable bowel syndrome Daughter   . Heart disease Maternal Grandfather   . Cancer Other     colon  . Leukemia  Maternal Grandmother   . Heart attack Mother     pacemaker  . Other Mother 45    meningioma-removed on 05/26/13   History  Substance Use Topics  . Smoking status: Never Smoker   . Smokeless tobacco: Never Used  . Alcohol Use: No   OB History    Gravida Para Term Preterm AB TAB SAB Ectopic Multiple Living   '3 2   1  1   2     ' Review of Systems  Constitutional: Negative for fever and chills.  HENT: Negative for sore throat.   Eyes: Negative for redness.  Respiratory: Negative for shortness of breath.   Cardiovascular: Negative for chest pain.  Gastrointestinal: Positive for abdominal pain. Negative for vomiting, diarrhea and constipation.  Endocrine: Negative for polyuria.  Genitourinary: Positive for flank pain. Negative for dysuria, hematuria, vaginal bleeding and vaginal discharge.  Musculoskeletal: Negative for back pain and neck pain.  Skin: Negative for rash.  Neurological: Negative for headaches.  Hematological: Does not bruise/bleed easily.  Psychiatric/Behavioral: Negative for confusion.      Allergies  Review of patient's allergies indicates no known allergies.  Home Medications   Prior to Admission medications   Medication Sig Start Date End Date Taking? Authorizing Provider  aspirin  81 MG tablet Take 81 mg by mouth daily.     Historical Provider, MD  B-D TB SYRINGE 1CC/25GX5/8" 25G X 5/8" 1 ML MISC USE AS DIRECTED 09/13/12   Marletta Lor, MD  Calcium 1500 MG tablet Take 1,500 mg by mouth.    Historical Provider, MD  Cholecalciferol (VITAMIN D) 2000 UNITS CAPS Take 2 capsules by mouth 2 (two) times daily.    Historical Provider, MD  cyanocobalamin (,VITAMIN B-12,) 1000 MCG/ML injection INJECT 1 ML (1000 MCG TOTAL) INTO THE MUSCLE EVERY 30 DAYS 07/15/13   Marletta Lor, MD  ESZOPICLONE 3 MG tablet  07/19/12   Historical Provider, MD  fluticasone (FLONASE) 50 MCG/ACT nasal spray PLACE TWO SPRAYS INTO THE NOSE DAILY 03/03/13   Marletta Lor, MD   lidocaine (LIDODERM) 5 % Place 1 patch onto the skin daily. Remove & Discard patch within 12 hours or as directed by MD    Historical Provider, MD  Omega 3-6-9 Fatty Acids (OMEGA 3-6-9 COMPLEX PO) Take by mouth daily.    Historical Provider, MD  oxymorphone (OPANA ER) 10 MG 12 hr tablet Take 15 mg by mouth every 12 (twelve) hours.     Historical Provider, MD  oxymorphone (OPANA) 5 MG tablet Take 5 mg by mouth every 4 (four) hours as needed for pain.    Historical Provider, MD  pantoprazole (PROTONIX) 40 MG tablet TAKE ONE TABLET BY MOUTH EVERY DAY 09/15/13   Marletta Lor, MD  promethazine (PHENERGAN) 25 MG suppository Place 25 mg rectally every 6 (six) hours as needed for nausea.    Historical Provider, MD  SYRINGE-NEEDLE, DISP, 3 ML 25G X 5/8" 3 ML MISC Every 30 days. 06/05/11   Marletta Lor, MD   BP 168/84 mmHg  Pulse 77  Temp(Src) 98.5 F (36.9 C) (Oral)  Resp 17  Ht '5\' 7"'  (1.702 m)  Wt 235 lb (106.595 kg)  BMI 36.80 kg/m2  SpO2 92%  LMP 05/05/1996 Physical Exam  Constitutional: She appears well-developed and well-nourished. No distress.  HENT:  Head: Atraumatic.  Eyes: Conjunctivae are normal. No scleral icterus.  Neck: Neck supple. No tracheal deviation present.  Cardiovascular: Normal rate, regular rhythm, normal heart sounds and intact distal pulses.   Pulmonary/Chest: Effort normal and breath sounds normal. No respiratory distress.  Abdominal: Soft. Normal appearance and bowel sounds are normal. She exhibits no distension and no mass. There is no tenderness. There is no rebound and no guarding.  Genitourinary:  No cva tenderness  Musculoskeletal: She exhibits no edema.  tls spine non tender  Neurological: She is alert.  Skin: Skin is warm and dry. No rash noted. She is not diaphoretic.  No shingles/rash to area of pain  Psychiatric: She has a normal mood and affect.  Nursing note and vitals reviewed.   ED Course  Procedures (including critical care  time) Labs Review   Results for orders placed or performed during the hospital encounter of 05/09/14  Urinalysis, Routine w reflex microscopic  Result Value Ref Range   Color, Urine YELLOW YELLOW   APPearance CLEAR CLEAR   Specific Gravity, Urine 1.012 1.005 - 1.030   pH 7.0 5.0 - 8.0   Glucose, UA NEGATIVE NEGATIVE mg/dL   Hgb urine dipstick MODERATE (A) NEGATIVE   Bilirubin Urine NEGATIVE NEGATIVE   Ketones, ur 15 (A) NEGATIVE mg/dL   Protein, ur NEGATIVE NEGATIVE mg/dL   Urobilinogen, UA 1.0 0.0 - 1.0 mg/dL   Nitrite NEGATIVE NEGATIVE   Leukocytes,  UA TRACE (A) NEGATIVE  Comprehensive metabolic panel  Result Value Ref Range   Sodium 140 135 - 145 mmol/L   Potassium 3.6 3.5 - 5.1 mmol/L   Chloride 104 96 - 112 mEq/L   CO2 27 19 - 32 mmol/L   Glucose, Bld 139 (H) 70 - 99 mg/dL   BUN 12 6 - 23 mg/dL   Creatinine, Ser 0.97 0.50 - 1.10 mg/dL   Calcium 9.4 8.4 - 10.5 mg/dL   Total Protein 7.1 6.0 - 8.3 g/dL   Albumin 4.5 3.5 - 5.2 g/dL   AST 21 0 - 37 U/L   ALT 21 0 - 35 U/L   Alkaline Phosphatase 74 39 - 117 U/L   Total Bilirubin 0.5 0.3 - 1.2 mg/dL   GFR calc non Af Amer 61 (L) >90 mL/min   GFR calc Af Amer 71 (L) >90 mL/min   Anion gap 9 5 - 15  CBC with Differential  Result Value Ref Range   WBC 9.2 4.0 - 10.5 K/uL   RBC 4.80 3.87 - 5.11 MIL/uL   Hemoglobin 14.7 12.0 - 15.0 g/dL   HCT 42.6 36.0 - 46.0 %   MCV 88.8 78.0 - 100.0 fL   MCH 30.6 26.0 - 34.0 pg   MCHC 34.5 30.0 - 36.0 g/dL   RDW 13.3 11.5 - 15.5 %   Platelets 216 150 - 400 K/uL   Neutrophils Relative % 84 (H) 43 - 77 %   Neutro Abs 7.7 1.7 - 7.7 K/uL   Lymphocytes Relative 11 (L) 12 - 46 %   Lymphs Abs 1.0 0.7 - 4.0 K/uL   Monocytes Relative 5 3 - 12 %   Monocytes Absolute 0.5 0.1 - 1.0 K/uL   Eosinophils Relative 0 0 - 5 %   Eosinophils Absolute 0.0 0.0 - 0.7 K/uL   Basophils Relative 0 0 - 1 %   Basophils Absolute 0.0 0.0 - 0.1 K/uL  Urine microscopic-add on  Result Value Ref Range   Squamous  Epithelial / LPF RARE RARE   WBC, UA 0-2 <3 WBC/hpf   RBC / HPF 7-10 <3 RBC/hpf   Bacteria, UA FEW (A) RARE   Ct Abdomen Pelvis Wo Contrast  05/09/2014   CLINICAL DATA:  Acute right flank pain.  Nausea and vomiting.  EXAM: CT ABDOMEN AND PELVIS WITHOUT CONTRAST  TECHNIQUE: Multidetector CT imaging of the abdomen and pelvis was performed following the standard protocol without IV contrast.  COMPARISON:  09/07/2009  FINDINGS: Lower chest:  Unremarkable.  Hepatobiliary: Hepatic steatosis again noted. No mass visualized on this non-contrast exam.  Pancreas: No mass or inflammatory process visualized on this non-contrast exam.  Spleen:  Within normal limits in size.  Adrenal Glands:  No mass identified.  Kidneys/Urinary tract: No evidence of urolithiasis or hydronephrosis. Small bilateral renal parapelvic cysts again noted.  Stomach/Bowel/Peritoneum: Unremarkable. Normal appendix visualized.  Vascular/Lymphatic: No pathologically enlarged lymph nodes identified. No other significant abnormality identified.  Reproductive: Small approximately 2 cm posterior uterine fibroid appears stable. No other significant abnormality seen on this noncontrast study.  Other:  None.  Musculoskeletal:  No suspicious bone lesions identified.  IMPRESSION: No evidence of urolithiasis, hydronephrosis, or other acute findings.  Stable hepatic steatosis and small posterior uterine fibroid.   Electronically Signed   By: Earle Gell M.D.   On: 05/09/2014 18:32        MDM   Iv ns. Labs.   Ct.  Reviewed nursing notes and prior charts for  additional history.   Pt currently declines any pain meds when offered.   Tr le, few bact and rbc - will cx urine. No fever, no cva tenderness.   Ct neg acute.  Recheck no pain, pt stable for d/c.  Discussed pcp f/u, f/u on ur cx, and return precautions.  Pt currently appears stable for d/c.    Mirna Mires, MD 05/09/14 [phone]

## 2014-05-09 NOTE — ED Notes (Addendum)
Pt reports left flank pain that moved from that area over hip down to front of pelvis. Pt got chills and nauseated and vomited. Had a second episode and passed out. Has dull pressure on right flank pain area now.

## 2014-05-11 LAB — URINE CULTURE

## 2014-05-19 ENCOUNTER — Ambulatory Visit (INDEPENDENT_AMBULATORY_CARE_PROVIDER_SITE_OTHER): Payer: BLUE CROSS/BLUE SHIELD | Admitting: Internal Medicine

## 2014-05-19 ENCOUNTER — Encounter: Payer: Self-pay | Admitting: Internal Medicine

## 2014-05-19 VITALS — BP 124/80 | HR 68 | Temp 97.9°F | Resp 20 | Ht 67.0 in | Wt 242.0 lb

## 2014-05-19 DIAGNOSIS — R112 Nausea with vomiting, unspecified: Secondary | ICD-10-CM

## 2014-05-19 DIAGNOSIS — G9050 Complex regional pain syndrome I, unspecified: Secondary | ICD-10-CM

## 2014-05-19 DIAGNOSIS — N23 Unspecified renal colic: Secondary | ICD-10-CM

## 2014-05-19 NOTE — Progress Notes (Signed)
Pre visit review using our clinic review tool, if applicable. No additional management support is needed unless otherwise documented below in the visit note. 

## 2014-05-19 NOTE — Patient Instructions (Signed)
Ureteral Colic (Kidney Stones) °Ureteral colic is the result of a condition when kidney stones form inside the kidney. Once kidney stones are formed they may move into the tube that connects the kidney with the bladder (ureter). If this occurs, this condition may cause pain (colic) in the ureter.  °CAUSES  °Pain is caused by stone movement in the ureter and the obstruction caused by the stone. °SYMPTOMS  °The pain comes and goes as the ureter contracts around the stone. The pain is usually intense, sharp, and stabbing in character. The location of the pain may move as the stone moves through the ureter. When the stone is near the kidney the pain is usually located in the back and radiates to the belly (abdomen). When the stone is ready to pass into the bladder the pain is often located in the lower abdomen on the side the stone is located. At this location, the symptoms may mimic those of a urinary tract infection with urinary frequency. Once the stone is located here it often passes into the bladder and the pain disappears completely. °TREATMENT  °· Your caregiver will provide you with medicine for pain relief. °· You may require specialized follow-up X-rays. °· The absence of pain does not always mean that the stone has passed. It may have just stopped moving. If the urine remains completely obstructed, it can cause loss of kidney function or even complete destruction of the involved kidney. It is your responsibility and in your interest that X-rays and follow-ups as suggested by your caregiver are completed. Relief of pain without passage of the stone can be associated with severe damage to the kidney, including loss of kidney function on that side. °· If your stone does not pass on its own, additional measures may be taken by your caregiver to ensure its removal. °HOME CARE INSTRUCTIONS  °· Increase your fluid intake. Water is the preferred fluid since juices containing vitamin C may acidify the urine making it  less likely for certain stones (uric acid stones) to pass. °· Strain all urine. A strainer will be provided. Keep all particulate matter or stones for your caregiver to inspect. °· Take your pain medicine as directed. °· Make a follow-up appointment with your caregiver as directed. °· Remember that the goal is passage of your stone. The absence of pain does not mean the stone is gone. Follow your caregiver's instructions. °· Only take over-the-counter or prescription medicines for pain, discomfort, or fever as directed by your caregiver. °SEEK MEDICAL CARE IF:  °· Pain cannot be controlled with the prescribed medicine. °· You have a fever. °· Pain continues for longer than your caregiver advises it should. °· There is a change in the pain, and you develop chest discomfort or constant abdominal pain. °· You feel faint or pass out. °MAKE SURE YOU:  °· Understand these instructions. °· Will watch your condition. °· Will get help right away if you are not doing well or get worse. °Document Released: 01/29/2005 Document Revised: 08/16/2012 Document Reviewed: 10/16/2010 °Exit[employer]® Patient Information ©2015 [employer]. This information is not intended to replace advice given to you by your health care provider. Make sure you discuss any questions you have with your health care provider. ° °

## 2014-05-19 NOTE — Progress Notes (Signed)
Subjective:    Patient ID: Haley Jenkins, female    DOB: Jun 01, 1950, 64 y.o.   MRN: [id number]  HPI  64 year old patient who is seen today in follow-up.  She was evaluated in the ED 10 days ago after presenting with the abrupt onset of severe right flank discomfort associated with nausea, vomiting and presyncope.  The patient had 2 episodes approximately 10 minutes apart.  Symptoms have not reoccurred.  ED evaluation included a urinalysis that did reveal hematuria.  CT abdominal scan is negative for acute pathology.  The patient has been symptom-free over the past 10 days.  No other renal stones were noted  Past Medical History  Diagnosis Date  . Arthritis   . Lactose intolerance   . Irritable bowel syndrome   . External hemorrhoid   . Gastric polyp   . GERD (gastroesophageal reflux disease)   . Hiatal hernia   . Cleft palate     repaired at birth  . RSD (reflex sympathetic dystrophy)   . BRCA negative 2/13    1, 2 negative-BART negative 09/10/11    History   Social History  . Marital Status: Married    Spouse Name: N/A    Number of Children: N/A  . Years of Education: N/A   Occupational History  . Not on file.   Social History Main Topics  . Smoking status: Never Smoker   . Smokeless tobacco: Never Used  . Alcohol Use: No  . Drug Use: No  . Sexual Activity:    Partners: Male    Birth Control/ Protection: Surgical     Comment: BTSP   Other Topics Concern  . Not on file   Social History Narrative    Past Surgical History  Procedure Laterality Date  . Knee surgery Left 1976  . Tubal ligation  1987  . Breast surgery cluster cyst removed      x 2  . Cleft palate repaired  birth, 25    at [facility]  . Urethral sling  11/13    mid-urethral sling  . Incisional breast biopsy  1997, 2003    Family History  Problem Relation Age of Onset  . Cancer Mother     ovarian   . Kidney disease Mother   . Osteoporosis Mother   . Cancer Father     lung  .  COPD Father   . Cancer Sister 59    breast   . Irritable bowel syndrome Daughter   . Heart disease Maternal Grandfather   . Cancer Other     colon  . Leukemia Maternal Grandmother   . Heart attack Mother     pacemaker  . Other Mother 9    meningioma-removed on 05/26/13    No Known Allergies  Current Outpatient Prescriptions on File Prior to Visit  Medication Sig Dispense Refill  . aspirin 81 MG tablet Take 81 mg by mouth daily.     . B-D TB SYRINGE 1CC/25GX5/8" 25G X 5/8" 1 ML MISC USE AS DIRECTED 21 each 1  . Calcium 1500 MG tablet Take 1,500 mg by mouth.    . Cholecalciferol (VITAMIN D) 2000 UNITS CAPS Take 2 capsules by mouth 2 (two) times daily.    . cyanocobalamin (,VITAMIN B-12,) 1000 MCG/ML injection INJECT 1 ML (1000 MCG TOTAL) INTO THE MUSCLE EVERY 30 DAYS 30 mL 0  . ESZOPICLONE 3 MG tablet     . fluticasone (FLONASE) 50 MCG/ACT nasal spray PLACE TWO SPRAYS INTO THE  NOSE DAILY 16 g 2  . Omega 3-6-9 Fatty Acids (OMEGA 3-6-9 COMPLEX PO) Take by mouth daily.    Marland Kitchen oxymorphone (OPANA ER) 10 MG 12 hr tablet Take 15 mg by mouth every 12 (twelve) hours.     Marland Kitchen oxymorphone (OPANA) 5 MG tablet Take 5 mg by mouth every 4 (four) hours as needed for pain.    . pantoprazole (PROTONIX) 40 MG tablet TAKE ONE TABLET BY MOUTH EVERY DAY 90 tablet 3  . promethazine (PHENERGAN) 25 MG suppository Place 25 mg rectally every 6 (six) hours as needed for nausea.     No current facility-administered medications on file prior to visit.    BP 124/80 mmHg  Pulse 68  Temp(Src) 97.9 F (36.6 C) (Oral)  Resp 20  Ht '5\' 7"'  (1.702 m)  Wt 242 lb (109.77 kg)  BMI 37.89 kg/m2  SpO2 97%  LMP 05/05/1996     Review of Systems  Constitutional: Negative.   HENT: Negative for congestion, dental problem, hearing loss, rhinorrhea, sinus pressure, sore throat and tinnitus.   Eyes: Negative for pain, discharge and visual disturbance.  Respiratory: Negative for cough and shortness of breath.     Cardiovascular: Negative for chest pain, palpitations and leg swelling.  Gastrointestinal: Positive for nausea, vomiting and abdominal pain. Negative for diarrhea, constipation, blood in stool and abdominal distention.  Genitourinary: Negative for dysuria, urgency, frequency, hematuria, flank pain, vaginal bleeding, vaginal discharge, difficulty urinating, vaginal pain and pelvic pain.  Musculoskeletal: Negative for joint swelling, arthralgias and gait problem.  Skin: Negative for rash.  Neurological: Negative for dizziness, syncope, speech difficulty, weakness, numbness and headaches.  Hematological: Negative for adenopathy.  Psychiatric/Behavioral: Negative for behavioral problems, dysphoric mood and agitation. The patient is not nervous/anxious.        Objective:   Physical Exam  Constitutional: She is oriented to person, place, and time. She appears well-developed and well-nourished. No distress.  HENT:  Head: Normocephalic.  Right Ear: External ear normal.  Left Ear: External ear normal.  Mouth/Throat: Oropharynx is clear and moist.  Eyes: Conjunctivae and EOM are normal. Pupils are equal, round, and reactive to light.  Neck: Normal range of motion. Neck supple. No thyromegaly present.  Cardiovascular: Normal rate, regular rhythm, normal heart sounds and intact distal pulses.   Pulmonary/Chest: Effort normal and breath sounds normal.  Abdominal: Soft. Bowel sounds are normal. She exhibits no distension and no mass. There is no tenderness. There is no rebound and no guarding.  Musculoskeletal: Normal range of motion.  Lymphadenopathy:    She has no cervical adenopathy.  Neurological: She is alert and oriented to person, place, and time.  Skin: Skin is warm and dry. No rash noted.  Psychiatric: She has a normal mood and affect. Her behavior is normal.          Assessment & Plan:   History of right flank pain.  Probable episode of renal colic, although no radiographic  confirmation. Reflex sympathetic dystrophy.  We'll continue present regimen  Follow-up as scheduled in 2 months or as needed.  Will report any clinical worsening

## 2014-06-16 ENCOUNTER — Encounter: Payer: Self-pay | Admitting: Obstetrics & Gynecology

## 2014-06-16 ENCOUNTER — Ambulatory Visit (INDEPENDENT_AMBULATORY_CARE_PROVIDER_SITE_OTHER): Payer: BLUE CROSS/BLUE SHIELD | Admitting: Obstetrics & Gynecology

## 2014-06-16 VITALS — BP 130/76 | HR 72 | Resp 18 | Ht 66.5 in | Wt 242.0 lb

## 2014-06-16 DIAGNOSIS — Z01419 Encounter for gynecological examination (general) (routine) without abnormal findings: Secondary | ICD-10-CM

## 2014-06-16 NOTE — Progress Notes (Signed)
64 y.o. [id number] MarriedCaucasianF here for annual exam.  Pt went to ER 05/09/14 for probable kidney stone.  CT and many labs done.  Reviewed with pt.  She had follow up with Dr. Raliegh Ip and will see him again 3/16.  Will have screening labs and repeat urine testing done then.    Denies VB.  Retired from [employer] three weeks ago!  So pleased with this.  Strong family hx of breast cancer--sister age 18 (now with a recurrence), MGM, maternal aunt age 48, [place], and mother with ovarian cancer. Negative genetic testing BRCA 1/2 and negative BART testing.  Patient's last menstrual period was 05/05/1996.          Sexually active: Yes.    The current method of family planning is tubal ligation.    Exercising: No.  The patient does not participate in regular exercise at present. Smoker:  no  Health Maintenance: Pap:  05/31/13 Neg. HR HPV:Neg History of abnormal Pap:  no MMG:  3//5/15 BIRADS1:neg Colonoscopy:  2011 every 5 years, pt aware due this year BMD:   07/06/13, normal TDaP:  2013 per pt Screening Labs: PCP, Hb today: PCP, Urine today: PCP   reports that she has never smoked. She has never used smokeless tobacco. She reports that she does not drink alcohol or use illicit drugs.  Past Medical History  Diagnosis Date  . Arthritis   . Lactose intolerance   . Irritable bowel syndrome   . External hemorrhoid   . Gastric polyp   . GERD (gastroesophageal reflux disease)   . Hiatal hernia   . Cleft palate     repaired at birth  . RSD (reflex sympathetic dystrophy)   . BRCA negative 2/13    1, 2 negative-BART negative 09/10/11  . Kidney stone     Past Surgical History  Procedure Laterality Date  . Knee surgery Left 1976  . Tubal ligation  1987  . Breast surgery cluster cyst removed      x 2  . Cleft palate repaired  birth, 48    at [facility]  . Urethral sling  11/13    mid-urethral sling  . Incisional breast biopsy  1997, 2003    Current Outpatient Prescriptions  Medication Sig  Dispense Refill  . aspirin 81 MG tablet Take 81 mg by mouth daily.     . B-D TB SYRINGE 1CC/25GX5/8" 25G X 5/8" 1 ML MISC USE AS DIRECTED 21 each 1  . Calcium 1500 MG tablet Take 1,500 mg by mouth.    . Cholecalciferol (VITAMIN D) 2000 UNITS CAPS Take 2 capsules by mouth 2 (two) times daily.    . cyanocobalamin (,VITAMIN B-12,) 1000 MCG/ML injection INJECT 1 ML (1000 MCG TOTAL) INTO THE MUSCLE EVERY 30 DAYS 30 mL 0  . ESZOPICLONE 3 MG tablet     . fluticasone (FLONASE) 50 MCG/ACT nasal spray PLACE TWO SPRAYS INTO THE NOSE DAILY 16 g 2  . Omega 3-6-9 Fatty Acids (OMEGA 3-6-9 COMPLEX PO) Take by mouth daily.    . OxyCODONE (OXYCONTIN) 15 mg T12A 12 hr tablet Take 15 mg by mouth every 12 (twelve) hours.    Marland Kitchen oxymorphone (OPANA) 5 MG tablet Take 5 mg by mouth every 4 (four) hours as needed for pain.    . pantoprazole (PROTONIX) 40 MG tablet TAKE ONE TABLET BY MOUTH EVERY DAY 90 tablet 3   No current facility-administered medications for this visit.    Family History  Problem Relation Age of Onset  .  Cancer Mother     ovarian   . Kidney disease Mother   . Osteoporosis Mother   . Cancer Father     lung  . COPD Father   . Cancer Sister 31    breast   . Irritable bowel syndrome Daughter   . Heart disease Maternal Grandfather   . Cancer Other     colon  . Leukemia Maternal Grandmother   . Heart attack Mother     pacemaker  . Other Mother 62    meningioma-removed on 05/26/13    ROS:  Pertinent items are noted in HPI.  Otherwise, a comprehensive ROS was negative.  Exam:   BP 130/76 mmHg  Pulse 72  Resp 18  Ht 5' 6.5" (1.689 m)  Wt 242 lb (109.77 kg)  BMI 38.48 kg/m2  LMP 05/05/1996  Weight change: +12#  Height: 5' 6.5" (168.9 cm)  Ht Readings from Last 3 Encounters:  06/16/14 5' 6.5" (1.689 m)  05/19/14 '5\' 7"'  (1.702 m)  05/09/14 '5\' 7"'  (1.702 m)    General appearance: alert, cooperative and appears stated age Head: Normocephalic, without obvious abnormality,  atraumatic Neck: no adenopathy, supple, symmetrical, trachea midline and thyroid normal to inspection and palpation Lungs: clear to auscultation bilaterally Breasts: normal appearance, no masses or tenderness Heart: regular rate and rhythm Abdomen: soft, non-tender; bowel sounds normal; no masses,  no organomegaly Extremities: extremities normal, atraumatic, no cyanosis or edema Skin: Skin color, texture, turgor normal. No rashes or lesions Lymph nodes: Cervical, supraclavicular, and axillary nodes normal. No abnormal inguinal nodes palpated Neurologic: Grossly normal   Pelvic: External genitalia:  no lesions              Urethra:  normal appearing urethra with no masses, tenderness or lesions              Bartholins and Skenes: normal                 Vagina: normal appearing vagina with normal color and discharge, no lesions              Cervix: no lesions              Pap taken: No. Bimanual Exam:  Uterus:  normal size, contour, position, consistency, mobility, non-tender              Adnexa: normal adnexa and no mass, fullness, tenderness               Rectovaginal: Confirms               Anus:  normal sphincter tone, no lesions  Chaperone was present for exam.  A:  Well Woman with normal exam Strong family hx of breast cancer--four family members Mother with ovarian cancer. H/O recurrent vulvar boils--no recent issues Small endocervical polyp noted on u/s.  Cannot see on exam today.  No vaginal bleeding--will continue to watch. Small uterine fibroids  P: Mammogram D/W pt. Will do 3D this year. This would be breast MRI year.  Pt ended up paying for part of breast MRI so she will consider. Breast cancer risk 38.9%. pap smear with HR HPV 1/15.  No pap today. Ca-125 and TVUS done last year.  Pt desires to do this next year. Will have pt see genetics counselors this year for possible additional testing. F/U 1 year or prn

## 2014-06-19 ENCOUNTER — Telehealth: Payer: Self-pay | Admitting: Genetic Counselor

## 2014-06-19 ENCOUNTER — Telehealth: Payer: Self-pay | Admitting: Emergency Medicine

## 2014-06-19 DIAGNOSIS — Z803 Family history of malignant neoplasm of breast: Secondary | ICD-10-CM

## 2014-06-19 NOTE — Telephone Encounter (Signed)
Referral for genetics entered. Called Artemio Aly at [facility] [phone] and appointment scheduled for 07/17/14 at 0900 (patient choice of date).   Patient notified of date and time and is agreeable.   Routing to provider for final review. Patient agreeable to disposition. Will close encounter

## 2014-06-19 NOTE — Telephone Encounter (Signed)
Pt aware of appt. With Roma Kayser 07/17/14@9 :00.Marland Kitchen

## 2014-07-17 ENCOUNTER — Telehealth: Payer: Self-pay | Admitting: Obstetrics & Gynecology

## 2014-07-17 ENCOUNTER — Ambulatory Visit (HOSPITAL_BASED_OUTPATIENT_CLINIC_OR_DEPARTMENT_OTHER): Payer: BLUE CROSS/BLUE SHIELD | Admitting: Genetic Counselor

## 2014-07-17 ENCOUNTER — Encounter: Payer: Self-pay | Admitting: Genetic Counselor

## 2014-07-17 ENCOUNTER — Other Ambulatory Visit: Payer: BLUE CROSS/BLUE SHIELD

## 2014-07-17 DIAGNOSIS — Z803 Family history of malignant neoplasm of breast: Secondary | ICD-10-CM

## 2014-07-17 DIAGNOSIS — Z8041 Family history of malignant neoplasm of ovary: Secondary | ICD-10-CM | POA: Insufficient documentation

## 2014-07-17 DIAGNOSIS — Z800 Family history of malignant neoplasm of digestive organs: Secondary | ICD-10-CM

## 2014-07-17 DIAGNOSIS — Z315 Encounter for genetic counseling: Secondary | ICD-10-CM

## 2014-07-17 DIAGNOSIS — Z808 Family history of malignant neoplasm of other organs or systems: Secondary | ICD-10-CM

## 2014-07-17 NOTE — Telephone Encounter (Signed)
Haley Jenkins from [facility] is calling requesting "additional genetic testing records the patient states Haley Jenkins is helping her with." The patient is at their facility for genetic testing right now.

## 2014-07-17 NOTE — Telephone Encounter (Signed)
Spoke with Santiago Glad from [facility]. "The patient states that there was additional testing run on the results that we have from 2013." Advised per review of paper chart BRCA testing was performed. "We also have BART testing that was performed.I think that is all that was done. Upon speaking to her a little more I do not think anything additional would have been done." Reviewed records with Santiago Glad in comparison to what we have on file. Santiago Glad is agreeable and will return call if she needs anything further.  Routing to provider for final review. Patient agreeable to disposition. Will close encounter

## 2014-07-17 NOTE — Progress Notes (Signed)
REFERRING PROVIDER: Marletta Lor, MD [place], [place] [id number]   Edwinna Areola, MD  PRIMARY PROVIDER:  Nyoka Cowden, MD  PRIMARY REASON FOR VISIT:  1. Family history of breast cancer   2. Family history of ovarian cancer   3. Family history of colon cancer   4. Family history of brain cancer      HISTORY OF PRESENT ILLNESS:   Haley Jenkins, a 64 y.o. female, was seen for a [place] cancer genetics consultation at the request of Dr. Sabra Heck due to a family history of cancer.  Haley Jenkins presents to clinic today to discuss the possibility of a hereditary predisposition to cancer, genetic testing, and to further clarify her future cancer risks, as well as potential cancer risks for family members. Haley Jenkins has no personal history of cancer.  She had BRCA1/2 testing with del/dup in 2013 through [facility] and it was negative.  CANCER HISTORY:   No history exists.     HORMONAL RISK FACTORS:  Menarche was at age 31.  First live birth at age 83.  OCP use for approximately 5 years.  Ovaries intact: yes.  Hysterectomy: no.  Menopausal status: postmenopausal.  HRT use: 0 years. Colonoscopy: yes; normal. Mammogram within the last year: yes. Number of breast biopsies: 3. Up to date with pelvic exams:  yes. Any excessive radiation exposure in the past:  no  Past Medical History  Diagnosis Date  . Arthritis   . Lactose intolerance   . Irritable bowel syndrome   . External hemorrhoid   . Gastric polyp   . GERD (gastroesophageal reflux disease)   . Hiatal hernia   . Cleft palate     repaired at birth  . RSD (reflex sympathetic dystrophy)   . BRCA negative 2/13    1, 2 negative-BART negative 09/10/11  . Kidney stone   . Family history of breast cancer   . Family history of ovarian cancer   . Family history of colon cancer     Past Surgical History  Procedure Laterality Date  . Knee surgery Left 1976  . Tubal ligation  1987  .  Breast surgery cluster cyst removed      x 2  . Cleft palate repaired  birth, 24    at [facility]  . Urethral sling  11/13    mid-urethral sling  . Incisional breast biopsy  1997, 2003    History   Social History  . Marital Status: Married    Spouse Name: N/A  . Number of Children: 2  . Years of Education: N/A   Social History Main Topics  . Smoking status: Never Smoker   . Smokeless tobacco: Never Used  . Alcohol Use: No  . Drug Use: No  . Sexual Activity:    Partners: Male    Birth Control/ Protection: Surgical     Comment: BTSP   Other Topics Concern  . None   Social History Narrative     FAMILY HISTORY:  We obtained a detailed, 4-generation family history.  Significant diagnoses are listed below: Family History  Problem Relation Age of Onset  . Kidney disease Mother   . Osteoporosis Mother   . Heart attack Mother     pacemaker  . Other Mother 46    meningioma-removed on 05/26/13  . Ovarian cancer Mother 77  . Brain cancer Mother 27    meningioma  . COPD Father   . Lung cancer Father 33  .  Breast cancer Sister 34  . Irritable bowel syndrome Daughter   . Heart disease Maternal Grandfather   . Breast cancer Other     MGM's two sisters  . Leukemia Maternal Grandmother   . Breast cancer Maternal Aunt 60  . Brain cancer Maternal Uncle   . Colon cancer Paternal Aunt   . Stroke Paternal Grandfather   . Colon cancer Sister 32  . Breast cancer Maternal Aunt   . Colon cancer Maternal Aunt   . Lymphoma Maternal Aunt   . Breast cancer Maternal Aunt   . Brain cancer Maternal Aunt   . Lung cancer Maternal Uncle     smoker  . Colon cancer Maternal Uncle   . Breast cancer Other     MGM's mother  . Breast cancer Cousin 23    maternal first cousin   Haley Jenkins has four sisters and one brother.  One sister was diagnosed with breast cancer at 64 and has a recurrence at age 65. She has declined genetic testing.  Another sister died in a car accident at 52  and a third sister had colon cancer at 50. Haley Jenkins mother was diagnosed with ovarian cancer at 79 and a meningioma at 80.  Her mother has three sisters with breast cancer, a brother and sister with a malignant brain tumor, a brother and sister with colon cancer and a brother with lung cancer.  Her maternal grandmother had leukemia, and two of her grandmother's sisters had breast cancer.  Haley Jenkins's maternal great grandmother had breast cancer.  Haley Jenkins's father was one of 12 children.  HE had lung cancer, a brother had lung cancer, and a sister had colon cancer. Patient's maternal ancestors are of [place] descent, and paternal ancestors are of [place] descent. There is no reported Ashkenazi Jewish ancestry. There is no known consanguinity.  GENETIC COUNSELING ASSESSMENT: Haley Jenkins is a 64 y.o. female with a family history of breast, ovarian, brain and colon cancer which somewhat suggestive of a hereditary cancer syndrome and predisposition to cancer. We, therefore, discussed and recommended the following at today's visit.   DISCUSSION: We reviewed the characteristics, features and inheritance patterns of hereditary cancer syndromes. We discussed retesting BRCA genes, but that most likely they would be negative again.  BAsed on the cancer in the family, there is a chance that the cancer could be related to either the Lynch syndrome genes, or other hereditary breast, ovarian or colon cancer syndrome.  We also discussed genetic testing, including the appropriate family members to test, the process of testing, insurance coverage and turn-around-time for results. Based on Haley Jenkins's family history, her sister diagnosed with breast cancer at 71 or her mother with ovarian cancer would be the most informative people to do genetic testing.  Haley Jenkins meets criteria for genetic testing and wants to undergo testing, but will try to get her mother to consent to testing as well.  We discussed the  implications of a negative, positive and/or variant of uncertain significant result. We recommended Haley Jenkins pursue genetic testing for the Comprehensive cancer gene panel. The Comprehensive Cancer Panel offered by [facility] includes sequencing and/or deletion duplication testing of the following 32 genes: APC, ATM, AXIN2, BARD1, BMPR1A, BRCA1, BRCA2, BRIP1, CDH1, CDK4, CDKN2A, CHEK2, EPCAM, FANCC, MLH1, MSH2, MSH6, MUTYH, NBN, PALB2, PMS2, POLD1, POLE, PTEN, RAD51C, RAD51D, SCG5/GREM1, SMAD4, STK11, TP53, VHL, and XRCC2.     PLAN: After considering the risks, benefits, and limitations,Haley Jenkins  provided informed consent  to pursue genetic testing and the blood sample was sent to [facility] for analysis of the Comprehensive Cancer Panel. Results should be available within approximately 3 weeks' time, at which point they will be disclosed by telephone to Haley Jenkins, as will any additional recommendations warranted by these results. Haley Jenkins will receive a summary of her genetic counseling visit and a copy of her results once available. This information will also be available in Epic. We encouraged Haley Jenkins to remain in contact with cancer genetics annually so that we can continuously update the family history and inform her of any changes in cancer genetics and testing that may be of benefit for her family. Haley Jenkins questions were answered to her satisfaction today. Our contact information was provided should additional questions or concerns arise.  Based on Haley Jenkins's family history, we recommended her mother, who was diagnosed with ovarian cancer at age 28, have genetic counseling and testing. Haley Jenkins will let us know if we can be of any assistance in coordinating genetic counseling and/or testing for this family member.   Lastly, we encouraged Ms. Salmons to remain in contact with cancer genetics annually so that we can continuously update the family history and inform her of any changes  in cancer genetics and testing that may be of benefit for this family.   Ms.  Blumenberg questions were answered to her satisfaction today. Our contact information was provided should additional questions or concerns arise. Thank you for the referral and allowing [place] to share in the care of your patient.   Farhad Burleson P. Florene Glen, [place], [facility] Certified Genetic Counselor Santiago [email or web address] phone: [phone]  The patient was seen for a total of 60 minutes in face-to-face genetic counseling.  This patient was discussed with Drs. Magrinat, Lindi Adie and/or Burr Medico who agrees with the above.    _______________________________________________________________________ For Office Staff:  Number of people involved in session: 2 Was an Intern/ student involved with case: no

## 2014-07-26 ENCOUNTER — Other Ambulatory Visit (INDEPENDENT_AMBULATORY_CARE_PROVIDER_SITE_OTHER): Payer: BLUE CROSS/BLUE SHIELD

## 2014-07-26 DIAGNOSIS — Z0000 Encounter for general adult medical examination without abnormal findings: Secondary | ICD-10-CM

## 2014-07-26 LAB — BASIC METABOLIC PANEL
BUN: 15 mg/dL (ref 6–23)
CALCIUM: 9.2 mg/dL (ref 8.4–10.5)
CHLORIDE: 102 meq/L (ref 96–112)
CO2: 29 mEq/L (ref 19–32)
CREATININE: 0.92 mg/dL (ref 0.40–1.20)
GFR: 65.65 mL/min (ref >60.00)
Glucose, Bld: 108 mg/dL — ABNORMAL HIGH (ref 70–99)
Potassium: 4 mEq/L (ref 3.5–5.1)
Sodium: 137 mEq/L (ref 135–145)

## 2014-07-26 LAB — CBC WITH DIFFERENTIAL/PLATELET
BASOS ABS: 0 10*3/uL (ref 0.0–0.1)
Basophils Relative: 0.5 % (ref 0.0–3.0)
Eosinophils Absolute: 0.2 10*3/uL (ref 0.0–0.7)
Eosinophils Relative: 3.4 % (ref 0.0–5.0)
HCT: 42.5 % (ref 36.0–46.0)
HEMOGLOBIN: 14.7 g/dL (ref 12.0–15.0)
Lymphocytes Relative: 33.2 % (ref 12.0–46.0)
Lymphs Abs: 2 10*3/uL (ref 0.7–4.0)
MCHC: 34.5 g/dL (ref 30.0–36.0)
MCV: 89 fl (ref 78.0–100.0)
MONO ABS: 0.4 10*3/uL (ref 0.1–1.0)
Monocytes Relative: 6.2 % (ref 3.0–12.0)
NEUTROS ABS: 3.5 10*3/uL (ref 1.4–7.7)
Neutrophils Relative %: 56.7 % (ref 43.0–77.0)
Platelets: 289 10*3/uL (ref 150.0–400.0)
RBC: 4.78 Mil/uL (ref 3.87–5.11)
RDW: 13.2 % (ref 11.5–15.5)
WBC: 6.2 10*3/uL (ref 4.0–10.5)

## 2014-07-26 LAB — POCT URINALYSIS DIPSTICK
Blood, UA: NEGATIVE
Glucose, UA: NEGATIVE
Ketones, UA: NEGATIVE
NITRITE UA: NEGATIVE
PROTEIN UA: NEGATIVE
SPEC GRAV UA: 1.025
UROBILINOGEN UA: 0.2
pH, UA: 5

## 2014-07-26 LAB — LIPID PANEL
CHOLESTEROL: 179 mg/dL (ref 0–200)
HDL: 32.8 mg/dL — ABNORMAL LOW (ref >39.00)
LDL Cholesterol: 116 mg/dL — ABNORMAL HIGH (ref 0–99)
NonHDL: 146.2
Total CHOL/HDL Ratio: 5
Triglycerides: 151 mg/dL — ABNORMAL HIGH (ref 0.0–149.0)
VLDL: 30.2 mg/dL (ref 0.0–40.0)

## 2014-07-26 LAB — HEPATIC FUNCTION PANEL
ALT: 15 U/L (ref 0–35)
AST: 14 U/L (ref 0–37)
Albumin: 3.9 g/dL (ref 3.5–5.2)
Alkaline Phosphatase: 66 U/L (ref 39–117)
BILIRUBIN DIRECT: 0.1 mg/dL (ref 0.0–0.3)
TOTAL PROTEIN: 6.5 g/dL (ref 6.0–8.3)
Total Bilirubin: 0.5 mg/dL (ref 0.2–1.2)

## 2014-07-26 LAB — TSH: TSH: 6.18 u[IU]/mL — AB (ref 0.35–4.50)

## 2014-08-01 ENCOUNTER — Telehealth: Payer: Self-pay | Admitting: Genetic Counselor

## 2014-08-01 ENCOUNTER — Encounter: Payer: Self-pay | Admitting: Genetic Counselor

## 2014-08-01 ENCOUNTER — Telehealth: Payer: Self-pay | Admitting: Emergency Medicine

## 2014-08-01 DIAGNOSIS — Z1379 Encounter for other screening for genetic and chromosomal anomalies: Secondary | ICD-10-CM | POA: Insufficient documentation

## 2014-08-01 NOTE — Telephone Encounter (Signed)
-----   Message from Megan Salon, MD sent at 07/23/2014  8:47 AM EDT ----- Regarding: breast MRI Pt doing yearly MMG and every other year MRI.  It is an MRI year.  MMG was done 07/07/14.  Can you call pt and make sure she wants to proceed?  Thanks.  MSM

## 2014-08-01 NOTE — Telephone Encounter (Signed)
Message left to return call to Burech Mcfarland at [phone].    

## 2014-08-01 NOTE — Telephone Encounter (Signed)
LM on VM with good news.  Asked that she CB to discuss results.

## 2014-08-02 ENCOUNTER — Encounter: Payer: Self-pay | Admitting: Internal Medicine

## 2014-08-02 ENCOUNTER — Ambulatory Visit (INDEPENDENT_AMBULATORY_CARE_PROVIDER_SITE_OTHER): Payer: BLUE CROSS/BLUE SHIELD | Admitting: Internal Medicine

## 2014-08-02 ENCOUNTER — Encounter: Payer: Self-pay | Admitting: Genetic Counselor

## 2014-08-02 VITALS — BP 140/80 | HR 74 | Temp 98.5°F | Resp 20 | Ht 66.0 in | Wt 242.0 lb

## 2014-08-02 DIAGNOSIS — R7989 Other specified abnormal findings of blood chemistry: Secondary | ICD-10-CM | POA: Diagnosis not present

## 2014-08-02 DIAGNOSIS — Z0000 Encounter for general adult medical examination without abnormal findings: Secondary | ICD-10-CM

## 2014-08-02 DIAGNOSIS — E538 Deficiency of other specified B group vitamins: Secondary | ICD-10-CM | POA: Diagnosis not present

## 2014-08-02 DIAGNOSIS — R7302 Impaired glucose tolerance (oral): Secondary | ICD-10-CM

## 2014-08-02 DIAGNOSIS — Z8041 Family history of malignant neoplasm of ovary: Secondary | ICD-10-CM

## 2014-08-02 DIAGNOSIS — Z800 Family history of malignant neoplasm of digestive organs: Secondary | ICD-10-CM

## 2014-08-02 DIAGNOSIS — Z803 Family history of malignant neoplasm of breast: Secondary | ICD-10-CM

## 2014-08-02 DIAGNOSIS — Z1379 Encounter for other screening for genetic and chromosomal anomalies: Secondary | ICD-10-CM

## 2014-08-02 MED ORDER — CYANOCOBALAMIN 1000 MCG/ML IJ SOLN: INTRAMUSCULAR | Status: DC

## 2014-08-02 MED ORDER — ESZOPICLONE 3 MG PO TABS: 3.0000 mg | ORAL_TABLET | Freq: Every day | ORAL | Status: DC

## 2014-08-02 MED ORDER — "TUBERCULIN SYRINGE 25G X 5/8"" 1 ML MISC": Status: DC

## 2014-08-02 MED ORDER — FLUTICASONE PROPIONATE 50 MCG/ACT NA SUSP: NASAL | Status: DC

## 2014-08-02 NOTE — Patient Instructions (Signed)
You need to lose weight.  Consider a lower calorie diet and regular exercise.  Return in 3 months for a follow-up TSH  Health Maintenance Adopting a healthy lifestyle and getting preventive care can go a long way to promote health and wellness. Talk with your health care provider about what schedule of regular examinations is right for you. This is a good chance for you to check in with your provider about disease prevention and staying healthy. In between checkups, there are plenty of things you can do on your own. Experts have done a lot of research about which lifestyle changes and preventive measures are most likely to keep you healthy. Ask your health care provider for more information. WEIGHT AND DIET  Eat a healthy diet  Be sure to include plenty of vegetables, fruits, low-fat dairy products, and lean protein.  Do not eat a lot of foods high in solid fats, added sugars, or salt.  Get regular exercise. This is one of the most important things you can do for your health.  Most adults should exercise for at least 150 minutes each week. The exercise should increase your heart rate and make you sweat (moderate-intensity exercise).  Most adults should also do strengthening exercises at least twice a week. This is in addition to the moderate-intensity exercise.  Maintain a healthy weight  Body mass index (BMI) is a measurement that can be used to identify possible weight problems. It estimates body fat based on height and weight. Your health care provider can help determine your BMI and help you achieve or maintain a healthy weight.  For females 43 years of age and older:   A BMI below 18.5 is considered underweight.  A BMI of 18.5 to 24.9 is normal.  A BMI of 25 to 29.9 is considered overweight.  A BMI of 30 and above is considered obese.  Watch levels of cholesterol and blood lipids  You should start having your blood tested for lipids and cholesterol at 63 years of age, then  have this test every 5 years.  You may need to have your cholesterol levels checked more often if:  Your lipid or cholesterol levels are high.  You are older than 63 years of age.  You are at high risk for heart disease.  CANCER SCREENING   Lung Cancer  Lung cancer screening is recommended for adults 29-55 years old who are at high risk for lung cancer because of a history of smoking.  A yearly low-dose CT scan of the lungs is recommended for people who:  Currently smoke.  Have quit within the past 15 years.  Have at least a 30-pack-year history of smoking. A pack year is smoking an average of one pack of cigarettes a day for 1 year.  Yearly screening should continue until it has been 15 years since you quit.  Yearly screening should stop if you develop a health problem that would prevent you from having lung cancer treatment.  Breast Cancer  Practice breast self-awareness. This means understanding how your breasts normally appear and feel.  It also means doing regular breast self-exams. Let your health care provider know about any changes, no matter how small.  If you are in your 20s or 30s, you should have a clinical breast exam (CBE) by a health care provider every 1-3 years as part of a regular health exam.  If you are 55 or older, have a CBE every year. Also consider having a breast X-ray (mammogram) every  year.  If you have a family history of breast cancer, talk to your health care provider about genetic screening.  If you are at high risk for breast cancer, talk to your health care provider about having an MRI and a mammogram every year.  Breast cancer gene (BRCA) assessment is recommended for women who have family members with BRCA-related cancers. BRCA-related cancers include:  Breast.  Ovarian.  Tubal.  Peritoneal cancers.  Results of the assessment will determine the need for genetic counseling and BRCA1 and BRCA2 testing. Cervical Cancer Routine  pelvic examinations to screen for cervical cancer are no longer recommended for nonpregnant women who are considered low risk for cancer of the pelvic organs (ovaries, uterus, and vagina) and who do not have symptoms. A pelvic examination may be necessary if you have symptoms including those associated with pelvic infections. Ask your health care provider if a screening pelvic exam is right for you.   The Pap test is the screening test for cervical cancer for women who are considered at risk.  If you had a hysterectomy for a problem that was not cancer or a condition that could lead to cancer, then you no longer need Pap tests.  If you are older than 65 years, and you have had normal Pap tests for the past 10 years, you no longer need to have Pap tests.  If you have had past treatment for cervical cancer or a condition that could lead to cancer, you need Pap tests and screening for cancer for at least 20 years after your treatment.  If you no longer get a Pap test, assess your risk factors if they change (such as having a new sexual partner). This can affect whether you should start being screened again.  Some women have medical problems that increase their chance of getting cervical cancer. If this is the case for you, your health care provider may recommend more frequent screening and Pap tests.  The human papillomavirus (HPV) test is another test that may be used for cervical cancer screening. The HPV test looks for the virus that can cause cell changes in the cervix. The cells collected during the Pap test can be tested for HPV.  The HPV test can be used to screen women 62 years of age and older. Getting tested for HPV can extend the interval between normal Pap tests from three to five years.  An HPV test also should be used to screen women of any age who have unclear Pap test results.  After 63 years of age, women should have HPV testing as often as Pap tests.  Colorectal Cancer  This  type of cancer can be detected and often prevented.  Routine colorectal cancer screening usually begins at 63 years of age and continues through 63 years of age.  Your health care provider may recommend screening at an earlier age if you have risk factors for colon cancer.  Your health care provider may also recommend using home test kits to check for hidden blood in the stool.  A small camera at the end of a tube can be used to examine your colon directly (sigmoidoscopy or colonoscopy). This is done to check for the earliest forms of colorectal cancer.  Routine screening usually begins at age 36.  Direct examination of the colon should be repeated every 5-10 years through 63 years of age. However, you may need to be screened more often if early forms of precancerous polyps or small growths are found.  Skin Cancer  Check your skin from head to toe regularly.  Tell your health care provider about any new moles or changes in moles, especially if there is a change in a mole's shape or color.  Also tell your health care provider if you have a mole that is larger than the size of a pencil eraser.  Always use sunscreen. Apply sunscreen liberally and repeatedly throughout the day.  Protect yourself by wearing long sleeves, pants, a wide-brimmed hat, and sunglasses whenever you are outside. HEART DISEASE, DIABETES, AND HIGH BLOOD PRESSURE   Have your blood pressure checked at least every 1-2 years. High blood pressure causes heart disease and increases the risk of stroke.  If you are between 11 years and 53 years old, ask your health care provider if you should take aspirin to prevent strokes.  Have regular diabetes screenings. This involves taking a blood sample to check your fasting blood sugar level.  If you are at a normal weight and have a low risk for diabetes, have this test once every three years after 63 years of age.  If you are overweight and have a high risk for diabetes,  consider being tested at a younger age or more often. PREVENTING INFECTION  Hepatitis B  If you have a higher risk for hepatitis B, you should be screened for this virus. You are considered at high risk for hepatitis B if:  You were born in a country where hepatitis B is common. Ask your health care provider which countries are considered high risk.  Your parents were born in a high-risk country, and you have not been immunized against hepatitis B (hepatitis B vaccine).  You have HIV or AIDS.  You use needles to inject street drugs.  You live with someone who has hepatitis B.  You have had sex with someone who has hepatitis B.  You get hemodialysis treatment.  You take certain medicines for conditions, including cancer, organ transplantation, and autoimmune conditions. Hepatitis C  Blood testing is recommended for:  Everyone born from [birth year] through 1964.  Anyone with known risk factors for hepatitis C. Sexually transmitted infections (STIs)  You should be screened for sexually transmitted infections (STIs) including gonorrhea and chlamydia if:  You are sexually active and are younger than 63 years of age.  You are older than 63 years of age and your health care provider tells you that you are at risk for this type of infection.  Your sexual activity has changed since you were last screened and you are at an increased risk for chlamydia or gonorrhea. Ask your health care provider if you are at risk.  If you do not have HIV, but are at risk, it may be recommended that you take a prescription medicine daily to prevent HIV infection. This is called pre-exposure prophylaxis (PrEP). You are considered at risk if:  You are sexually active and do not regularly use condoms or know the HIV status of your partner(s).  You take drugs by injection.  You are sexually active with a partner who has HIV. Talk with your health care provider about whether you are at high risk of being  infected with HIV. If you choose to begin PrEP, you should first be tested for HIV. You should then be tested every 3 months for as long as you are taking PrEP.  PREGNANCY   If you are premenopausal and you may become pregnant, ask your health care provider about preconception counseling.  If you  may become pregnant, take 400 to 800 micrograms (mcg) of folic acid every day.  If you want to prevent pregnancy, talk to your health care provider about birth control (contraception). OSTEOPOROSIS AND MENOPAUSE   Osteoporosis is a disease in which the bones lose minerals and strength with aging. This can result in serious bone fractures. Your risk for osteoporosis can be identified using a bone density scan.  If you are 59 years of age or older, or if you are at risk for osteoporosis and fractures, ask your health care provider if you should be screened.  Ask your health care provider whether you should take a calcium or vitamin D supplement to lower your risk for osteoporosis.  Menopause may have certain physical symptoms and risks.  Hormone replacement therapy may reduce some of these symptoms and risks. Talk to your health care provider about whether hormone replacement therapy is right for you.  HOME CARE INSTRUCTIONS   Schedule regular health, dental, and eye exams.  Stay current with your immunizations.   Do not use any tobacco products including cigarettes, chewing tobacco, or electronic cigarettes.  If you are pregnant, do not drink alcohol.  If you are breastfeeding, limit how much and how often you drink alcohol.  Limit alcohol intake to no more than 1 drink per day for nonpregnant women. One drink equals 12 ounces of beer, 5 ounces of wine, or 1 ounces of hard liquor.  Do not use street drugs.  Do not share needles.  Ask your health care provider for help if you need support or information about quitting drugs.  Tell your health care provider if you often feel  depressed.  Tell your health care provider if you have ever been abused or do not feel safe at home. Document Released: 11/04/2010 Document Revised: 09/05/2013 Document Reviewed: 03/23/2013 [facility] Patient Information 2015 [place], [place]. This information is not intended to replace advice given to you by your health care provider. Make sure you discuss any questions you have with your health care provider.

## 2014-08-02 NOTE — Progress Notes (Signed)
HPI: Ms. Guyett was previously seen in the [facility] due to a family history of cancer and concerns regarding a hereditary predisposition to cancer. Please refer to our prior cancer genetics clinic note for more information regarding Ms. Sigley's medical, social and family histories, and our assessment and recommendations, at the time. Ms. Dina recent genetic test results were disclosed to her, as were recommendations warranted by these results. These results and recommendations are discussed in more detail below.  GENETIC TEST RESULTS: At the time of Ms. Feltes's visit, we recommended she pursue genetic testing of the Comprehensive Cancer gene panel. The Comprehensive Cancer Panel offered by [facility] includes sequencing and/or deletion duplication testing of the following 32 genes: APC, ATM, AXIN2, BARD1, BMPR1A, BRCA1, BRCA2, BRIP1, CDH1, CDK4, CDKN2A, CHEK2, EPCAM, FANCC, MLH1, MSH2, MSH6, MUTYH, NBN, PALB2, PMS2, POLD1, POLE, PTEN, RAD51C, RAD51D, SCG5/GREM1, SMAD4, STK11, TP53, VHL, and XRCC2.   The report date is July 31, 2014.  Genetic testing was normal, and did not reveal a deleterious mutation in these genes. The test report has been scanned into EPIC and is located under the Media tab.   We discussed with Ms. Orbach that since the current genetic testing is not perfect, it is possible there may be a gene mutation in one of these genes that current testing cannot detect, but that chance is small. We also discussed, that it is possible that another gene that has not yet been discovered, or that we have not yet tested, is responsible for the cancer diagnoses in the family, and it is, therefore, important to remain in touch with cancer genetics in the future so that we can continue to offer Ms. Reggio the most up to date genetic testing.   CANCER SCREENING RECOMMENDATIONS: This result is reassuring and suggests that Ms. Fazekas is not at increased risk for cancer due to an  inherited predisposition associated with one of these genes.Genetic testing is most informative when performed on someone who has cancer.  Ms. Sui's sister has refused genetic testing, however, her mother who had ovarian cancer is alive.  She will talk with her mother about undergoing genetic testing to determine if there is a hereditary cancer syndrome in the family, and Ms. Bognar did not inherit the hereditary mutation.  We recommended she continue to follow the cancer management and screening guidelines provided by her oncology and primary providers.   RECOMMENDATIONS FOR FAMILY MEMBERS: Women in this family might be at some increased risk of developing cancer, over the general population risk, simply due to the family history of cancer. We recommended women in this family have a yearly mammogram beginning at age 12, or 28 years younger than the earliest onset of cancer, an an annual clinical breast exam, and perform monthly breast self-exams. Women in this family should also have a gynecological exam as recommended by their primary provider. All family members should have a colonoscopy by age 28.  FOLLOW-UP: Lastly, we discussed with Ms. Gruner that cancer genetics is a rapidly advancing field and it is possible that new genetic tests will be appropriate for her and/or her family members in the future. We encouraged her to remain in contact with cancer genetics on an annual basis so we can update her personal and family histories and let her know of advances in cancer genetics that may benefit this family.   Our contact number was provided. Ms. Ellingwood questions were answered to her satisfaction, and she knows she is welcome to  call us at anytime with additional questions or concerns.   Kazimierz Springborn, MS, CGC Certified Genetic Counselor [email or web address]   

## 2014-08-02 NOTE — Progress Notes (Signed)
Pre visit review using our clinic review tool, if applicable. No additional management support is needed unless otherwise documented below in the visit note. 

## 2014-08-02 NOTE — Progress Notes (Signed)
Subjective:    Patient ID: Haley Jenkins, female    DOB: 1950/07/31, 64 y.o.   MRN: [id number]  HPI   Wt Readings from Last 3 Encounters:  06/16/14 242 lb (109.77 kg)  05/19/14 242 lb (109.77 kg)  05/09/14 235 lb (106.595 kg)    Review of Systems  See below    Objective:   Physical Exam   See below      Assessment & Plan:  See below Subjective:    Patient ID: Haley Jenkins, female    DOB: Sep 27, 1950, 64 y.o.   MRN: [id number]  64  -year-old patient who is seen today for an annual physical.  She has seen by GI and is seen annually by gynecology. She has a history of reflex sympathetic dystrophy and has  had radiofrequency ablation of  the left lumbar and sacral nerve roots. . Clinically she is doing well. She does have a history of B12 deficiency which has been treated with monthly B12 injections   Wt Readings from Last 3 Encounters:  08/02/14 242 lb (109.77 kg)  06/16/14 242 lb (109.77 kg)  05/19/14 242 lb (109.77 kg)    Allergies (verified):  No Known Drug Allergies   Past History:    Current Problems:  REFLEX SYMPATHETIC DYSTROPHY (ICD-337.20)  ARTHRITIS (ICD-716.90)  LACTOSE INTOLERANCE (ICD-271.3)  IRRITABLE BOWEL SYNDROME (ICD-564.1)  EXTERNAL HEMORRHOIDS (ICD-455.3)  GASTRIC POLYP (ICD-211.1)  GERD  Hiatal hernia  B12 deficiency  Past Surgical History:  knee surgery  Tubal Ligation  Breast surgery cluster cyst removed X2  Sinus surgery 1984  history of cleft palate [facility]  EGD 2009  colonoscopy 2004  2009  Family History:   Family History of Breast Cancer: sister-age 50  Family History of Ovarian Cancer: Mother  Family History of Colon Cancer: aunt  Family History of Heart Disease: MGF  Family History of Irritable Bowel Syndrome: daughter  Family History of Kidney Disease: mother  Lung Cancer: Father, Age 50, COPD  Mother: CAD, solitary kidney,osteoporosis sister with bilateral breast cancer at age 66 maternal  grandmother with breast cancer 4 sisters, 1 brother- breast ca   Negative genetic testing (BRCA)  Social History:   Married, 2 girls  Occupation: Press photographer  Patient has never smoked.  Alcohol Use - no  Illicit Drug Use - no    Past Medical History  Diagnosis Date  . Arthritis   . Lactose intolerance   . Irritable bowel syndrome   . External hemorrhoid   . Gastric polyp   . GERD (gastroesophageal reflux disease)   . Hiatal hernia     History   Social History  . Marital Status: Married    Spouse Name: N/A    Number of Children: N/A  . Years of Education: N/A   Occupational History  . Not on file.   Social History Main Topics  . Smoking status: Never Smoker   . Smokeless tobacco: Never Used  . Alcohol Use: Yes     rare   . Drug Use: Not on file  . Sexually Active: Not on file   Other Topics Concern  . Not on file   Social History Narrative  . No narrative on file    Past Surgical History  Procedure Date  . Knee surgery   . Tubal ligation   . Breast surgery cluster cyst removed     x 2  . Sinus surgery 1984  . Cleft palate repaired     at  [facility]  . Esophagogastroduodenoscopy 2009  . Colonoscopy 2004    Family History  Problem Relation Age of Onset  . Cancer Mother     ovarian   . Kidney disease Mother   . Osteoporosis Mother   . Cancer Father     lung  . COPD Father   . Cancer Sister 67    breast   . Irritable bowel syndrome Daughter   . Heart disease Maternal Grandfather   . Cancer Other     colon    Not on File  Current Outpatient Prescriptions on File Prior to Visit  Medication Sig Dispense Refill  . Cyanocobalamin (NASCOBAL) 500 MCG/0.1ML SOLN One spray, one nostril, once a week........Marland Kitchen MUST HAVE OFFICE VISIT  1 Bottle  0  . pantoprazole (PROTONIX) 40 MG tablet TAKE ONE TABLET BY MOUTH EVERY DAY  60 tablet  0    BP 128/86  Pulse 109  Temp(Src) 98.5 F (36.9 C) (Oral)  Ht 5' 7.5" (1.715 m)  Wt 236 lb (107.049 kg)   BMI 36.42 kg/m2  SpO2 95%       Review of Systems  Constitutional: Negative.   HENT: Negative for hearing loss, congestion, sore throat, rhinorrhea, dental problem, sinus pressure and tinnitus.   Eyes: Negative for pain, discharge and visual disturbance.  Respiratory: Negative for cough and shortness of breath.   Cardiovascular: Negative for chest pain, palpitations and leg swelling.  Gastrointestinal: Negative for nausea, vomiting, abdominal pain, diarrhea, constipation, blood in stool and abdominal distention.  Genitourinary: Negative for dysuria, urgency, frequency, hematuria, flank pain, vaginal bleeding, vaginal discharge, difficulty urinating, vaginal pain and pelvic pain.  Musculoskeletal: Positive for back pain and gait problem. Negative for joint swelling and arthralgias.  Skin: Negative for rash.  Neurological: Negative for dizziness, syncope, speech difficulty, weakness, numbness and headaches.  Hematological: Negative for adenopathy.  Psychiatric/Behavioral: Negative for behavioral problems, dysphoric mood and agitation. The patient is not nervous/anxious.        Objective:   Physical Exam  Constitutional: She is oriented to person, place, and time. She appears well-developed and well-nourished.  HENT:  Head: Normocephalic and atraumatic.  Right Ear: External ear normal.  Left Ear: External ear normal.  Mouth/Throat: Oropharynx is clear and moist.  Eyes: Conjunctivae and EOM are normal.  Neck: Normal range of motion. Neck supple. No JVD present. No thyromegaly present.  Cardiovascular: Normal rate, regular rhythm, normal heart sounds and intact distal pulses.   No murmur heard. Pulmonary/Chest: Effort normal and breath sounds normal. She has no wheezes. She has no rales.  Abdominal: Soft. Bowel sounds are normal. She exhibits no distension and no mass. There is no tenderness. There is no rebound and no guarding.  Musculoskeletal: Normal range of motion. She  exhibits no edema and no tenderness.  Neurological: She is alert and oriented to person, place, and time. She has normal reflexes. No cranial nerve deficit. She exhibits normal muscle tone. Coordination normal.   monofilament testing decreased on the left.  Skin: Skin is warm and dry. No rash noted.  Psychiatric: She has a normal mood and affect. Her behavior is normal.          Assessment & Plan:   Preventive health examination Reflex sympathetic dystrophy B12 deficiency. Options were discussed we'll continue  monthly injections  Exogenous obesity.  Impaired glucose tolerance.   Abnormal TSH.  Recheck 3 months.  Consider supplementation if worsening

## 2014-08-04 ENCOUNTER — Telehealth: Payer: Self-pay | Admitting: Genetic Counselor

## 2014-08-04 NOTE — Telephone Encounter (Signed)
Spoke with patient. She is planning to have 3D mammogram completed next week. She states she will call back with information regarding scheduling based on results of mammogram for next week.

## 2014-08-04 NOTE — Telephone Encounter (Signed)
Patient called stating she received a letter from [facility] that she will owe $2742 for her genetic testing.  Called the [facility] rep. He looked up information and found that billing called her on 3/21 and LM on her VM stating that number asking for a call back.  They then sent a letter on 3/23, which is what sh received and called me about.  Roderic Palau stated that he will call her and talk with her about the cost, and determine if he can lower the amount based on income.  Typically they can lower the amount by up to 97%.  He felt that the reason why her cost is so much is that she had unmet deductible and copays.

## 2014-08-11 NOTE — Telephone Encounter (Signed)
MMG was 07/06/13, correction from initial note.  Placed in recall for one month to see if pt had MMG.  Encounter closed.

## 2014-08-18 ENCOUNTER — Other Ambulatory Visit: Payer: Self-pay

## 2014-08-18 DIAGNOSIS — Z1231 Encounter for screening mammogram for malignant neoplasm of breast: Secondary | ICD-10-CM

## 2014-08-18 DIAGNOSIS — Z803 Family history of malignant neoplasm of breast: Secondary | ICD-10-CM

## 2014-09-04 ENCOUNTER — Ambulatory Visit
Admission: RE | Admit: 2014-09-04 | Discharge: 2014-09-04 | Disposition: A | Discharge status: Home / Self Care | Payer: BLUE CROSS/BLUE SHIELD | Source: Ambulatory Visit

## 2014-09-04 DIAGNOSIS — Z803 Family history of malignant neoplasm of breast: Secondary | ICD-10-CM

## 2014-09-04 DIAGNOSIS — Z1231 Encounter for screening mammogram for malignant neoplasm of breast: Secondary | ICD-10-CM

## 2014-09-11 ENCOUNTER — Telehealth: Payer: Self-pay | Admitting: Emergency Medicine

## 2014-09-11 DIAGNOSIS — Z1239 Encounter for other screening for malignant neoplasm of breast: Secondary | ICD-10-CM

## 2014-09-11 DIAGNOSIS — Z803 Family history of malignant neoplasm of breast: Secondary | ICD-10-CM

## 2014-09-11 NOTE — Telephone Encounter (Signed)
-----   Message from Megan Salon, MD sent at 09/10/2014  5:24 PM EDT ----- Regarding: MRI Pt has strong family hx of breast cancer and has been doing every other year MRI.  This is MRI year.  She had a 3D MMG for screening that was negative.  She ended up paying for part of MRI 2014 so she may not want to do it but I think we need to document if she declines it.  Thanks.  MSM

## 2014-09-11 NOTE — Telephone Encounter (Signed)
Called patient. 3D Mammogram completed at [facility] on 09/05/14.  Patient hesitant to schedule due to cost, over 1,000.00 with last MRI.  Advised patient to call [facility] and can discuss cost. If agrees to schedule our office will work on prior authorization. Patient agreeable.

## 2014-09-12 ENCOUNTER — Other Ambulatory Visit: Payer: Self-pay | Admitting: Internal Medicine

## 2014-09-18 NOTE — Telephone Encounter (Signed)
Message left to return call to Haley Jenkins at [phone].    

## 2014-09-27 NOTE — Telephone Encounter (Signed)
No current appointment scheduled at [facility]. Called [facility] and spoke with Frederickson. Advised that order is placed and requested contact to patient to schedule as new policy is that patient must be contacted and screening questions answered by patient to schedule.

## 2014-10-04 DIAGNOSIS — IMO0002 Reserved for concepts with insufficient information to code with codable children: Secondary | ICD-10-CM

## 2014-10-04 HISTORY — DX: Reserved for concepts with insufficient information to code with codable children: IMO0002

## 2014-10-11 NOTE — Telephone Encounter (Signed)
Called patient home and no answer. Called patient mobile and Message left to return call to [place] at [phone].

## 2014-10-13 ENCOUNTER — Other Ambulatory Visit (INDEPENDENT_AMBULATORY_CARE_PROVIDER_SITE_OTHER): Payer: BLUE CROSS/BLUE SHIELD

## 2014-10-13 DIAGNOSIS — R7989 Other specified abnormal findings of blood chemistry: Secondary | ICD-10-CM | POA: Diagnosis not present

## 2014-10-13 LAB — TSH: TSH: 4.45 u[IU]/mL (ref 0.35–4.50)

## 2014-10-18 ENCOUNTER — Encounter: Payer: Self-pay | Admitting: Internal Medicine

## 2014-10-19 NOTE — Telephone Encounter (Signed)
Order cancelled.   Encounter closed.  

## 2014-10-19 NOTE — Telephone Encounter (Signed)
Dr. Sabra Heck, I have attempted to reach patient three times without response. Patient aware that order was placed on 09/11/14. Patient was to discuss cost with [facility].   How to proceed? Okay to cancel MRI order?

## 2014-10-19 NOTE — Telephone Encounter (Signed)
OK to cancel MRI order and close encounter.  Also ok to remove from hold/recall if in it for this reason.

## 2014-11-02 ENCOUNTER — Other Ambulatory Visit: Payer: BLUE CROSS/BLUE SHIELD

## 2014-11-03 HISTORY — PX: KNEE ARTHROSCOPY: SUR90

## 2015-01-09 ENCOUNTER — Encounter: Payer: Self-pay | Admitting: Internal Medicine

## 2015-01-10 MED ORDER — "TUBERCULIN SYRINGE 25G X 5/8"" 1 ML MISC": Status: DC

## 2015-02-08 ENCOUNTER — Telehealth: Payer: Self-pay

## 2015-02-08 MED ORDER — ESZOPICLONE 3 MG PO TABS
3.0000 mg | ORAL_TABLET | Freq: Every day | ORAL | Status: DC
Start: 2015-02-08 — End: 2016-08-26

## 2015-02-08 NOTE — Telephone Encounter (Signed)
Rx called in to pharmacy. 

## 2015-02-08 NOTE — Telephone Encounter (Signed)
Refill request for Eszopiclone 3mg  tablet.   [place]

## 2015-07-23 ENCOUNTER — Other Ambulatory Visit: Payer: Self-pay

## 2015-07-23 DIAGNOSIS — Z1231 Encounter for screening mammogram for malignant neoplasm of breast: Secondary | ICD-10-CM

## 2015-07-26 ENCOUNTER — Other Ambulatory Visit (INDEPENDENT_AMBULATORY_CARE_PROVIDER_SITE_OTHER): Payer: BLUE CROSS/BLUE SHIELD

## 2015-07-26 DIAGNOSIS — Z0000 Encounter for general adult medical examination without abnormal findings: Secondary | ICD-10-CM

## 2015-07-26 LAB — CBC WITH DIFFERENTIAL/PLATELET
Basophils Absolute: 0 10*3/uL (ref 0.0–0.1)
Basophils Relative: 0.4 % (ref 0.0–3.0)
EOS ABS: 0.2 10*3/uL (ref 0.0–0.7)
EOS PCT: 3 % (ref 0.0–5.0)
HCT: 43.3 % (ref 36.0–46.0)
HEMOGLOBIN: 15.1 g/dL — AB (ref 12.0–15.0)
LYMPHS ABS: 1.6 10*3/uL (ref 0.7–4.0)
Lymphocytes Relative: 28.1 % (ref 12.0–46.0)
MCHC: 34.9 g/dL (ref 30.0–36.0)
MCV: 88.7 fl (ref 78.0–100.0)
MONO ABS: 0.4 10*3/uL (ref 0.1–1.0)
Monocytes Relative: 6.9 % (ref 3.0–12.0)
NEUTROS PCT: 61.6 % (ref 43.0–77.0)
Neutro Abs: 3.6 10*3/uL (ref 1.4–7.7)
Platelets: 215 10*3/uL (ref 150.0–400.0)
RBC: 4.89 Mil/uL (ref 3.87–5.11)
RDW: 13.4 % (ref 11.5–15.5)
WBC: 5.8 10*3/uL (ref 4.0–10.5)

## 2015-07-26 LAB — POC URINALSYSI DIPSTICK (AUTOMATED)
Blood, UA: NEGATIVE
Glucose, UA: NEGATIVE
Ketones, UA: NEGATIVE
NITRITE UA: NEGATIVE
Spec Grav, UA: >=1.03
UROBILINOGEN UA: 0.2
pH, UA: 5.5

## 2015-07-26 LAB — HEPATIC FUNCTION PANEL
ALT: 14 U/L (ref 0–35)
AST: 13 U/L (ref 0–37)
Albumin: 4.2 g/dL (ref 3.5–5.2)
Alkaline Phosphatase: 68 U/L (ref 39–117)
BILIRUBIN DIRECT: 0.1 mg/dL (ref 0.0–0.3)
BILIRUBIN TOTAL: 0.7 mg/dL (ref 0.2–1.2)
Total Protein: 6.8 g/dL (ref 6.0–8.3)

## 2015-07-26 LAB — BASIC METABOLIC PANEL
BUN: 13 mg/dL (ref 6–23)
CALCIUM: 9.3 mg/dL (ref 8.4–10.5)
CO2: 27 mEq/L (ref 19–32)
CREATININE: 0.92 mg/dL (ref 0.40–1.20)
Chloride: 105 mEq/L (ref 96–112)
GFR: 65.44 mL/min (ref >60.00)
GLUCOSE: 131 mg/dL — AB (ref 70–99)
Potassium: 3.5 mEq/L (ref 3.5–5.1)
Sodium: 140 mEq/L (ref 135–145)

## 2015-07-26 LAB — LIPID PANEL
CHOLESTEROL: 217 mg/dL — AB (ref 0–200)
HDL: 35.1 mg/dL — ABNORMAL LOW (ref >39.00)
LDL CALC: 145 mg/dL — AB (ref 0–99)
NonHDL: 181.54
TRIGLYCERIDES: 182 mg/dL — AB (ref 0.0–149.0)
Total CHOL/HDL Ratio: 6
VLDL: 36.4 mg/dL (ref 0.0–40.0)

## 2015-07-26 LAB — TSH: TSH: 3.91 u[IU]/mL (ref 0.35–4.50)

## 2015-08-03 ENCOUNTER — Ambulatory Visit (INDEPENDENT_AMBULATORY_CARE_PROVIDER_SITE_OTHER): Payer: BLUE CROSS/BLUE SHIELD | Admitting: Internal Medicine

## 2015-08-03 ENCOUNTER — Encounter: Payer: Self-pay | Admitting: Internal Medicine

## 2015-08-03 VITALS — BP 130/80 | HR 96 | Temp 98.5°F | Resp 20 | Ht 66.5 in | Wt 237.0 lb

## 2015-08-03 DIAGNOSIS — Z0000 Encounter for general adult medical examination without abnormal findings: Secondary | ICD-10-CM | POA: Diagnosis not present

## 2015-08-03 DIAGNOSIS — K219 Gastro-esophageal reflux disease without esophagitis: Secondary | ICD-10-CM

## 2015-08-03 DIAGNOSIS — R7302 Impaired glucose tolerance (oral): Secondary | ICD-10-CM

## 2015-08-03 MED ORDER — CYANOCOBALAMIN 1000 MCG/ML IJ SOLN: INTRAMUSCULAR | Status: DC

## 2015-08-03 MED ORDER — FLUTICASONE PROPIONATE 50 MCG/ACT NA SUSP: NASAL | Status: DC

## 2015-08-03 MED ORDER — PANTOPRAZOLE SODIUM 40 MG PO TBEC: 40.0000 mg | DELAYED_RELEASE_TABLET | Freq: Every day | ORAL | Status: DC

## 2015-08-03 MED ORDER — "SYRINGE/NEEDLE (DISP) 25G X 1"" 3 ML MISC": Status: DC

## 2015-08-03 NOTE — Progress Notes (Signed)
Subjective:    Patient ID: Haley Jenkins, female    DOB: [birth year], 65 y.o.   MRN: [id number]  HPI   Wt Readings from Last 3 Encounters:  08/02/14 242 lb (109.77 kg)  06/16/14 242 lb (109.77 kg)  05/19/14 242 lb (109.77 kg)    Review of Systems  See below    Objective:   Physical Exam   See below      Assessment & Plan:  See below Subjective:    Patient ID: Haley Jenkins, female    DOB: February 27, 1951, 65 y.o.   MRN: [id number]  65   -year-old patient who is seen today for an annual physical.  She is  seen by GI and is seen annually by gynecology. She has a history of reflex sympathetic dystrophy and has  had radiofrequency ablation of  the left lumbar and sacral nerve roots. . Clinically she is doing well. She does have a history of B12 deficiency which has been treated with monthly B12 injections   Wt Readings from Last 3 Encounters:  08/03/15 237 lb (107.502 kg)  08/02/14 242 lb (109.77 kg)  06/16/14 242 lb (109.77 kg)    Allergies (verified):  No Known Drug Allergies   Past History:    Current Problems:  REFLEX SYMPATHETIC DYSTROPHY (ICD-337.20)  ARTHRITIS (ICD-716.90)  LACTOSE INTOLERANCE (ICD-271.3)  IRRITABLE BOWEL SYNDROME (ICD-564.1)  EXTERNAL HEMORRHOIDS (ICD-455.3)  GASTRIC POLYP (ICD-211.1)  GERD  Hiatal hernia  B12 deficiency  Past Surgical History:  knee surgery  Tubal Ligation  Breast surgery cluster cyst removed X2  Sinus surgery 1984  history of cleft palate repaired at [facility]  EGD 2009  colonoscopy 2004  2009  Family History:   Family History of Breast Cancer: sister-age 43  Family History of Ovarian Cancer: Mother  Family History of Colon Cancer: aunt  Family History of Heart Disease: MGF  Family History of Irritable Bowel Syndrome: daughter  Family History of Kidney Disease: mother  Lung Cancer: Father, Age 36, COPD  Mother: CAD, solitary kidney,osteoporosis sister with bilateral breast cancer at age 39 maternal  grandmother with breast cancer 4 sisters, 1 brother- breast ca   Negative genetic testing (BRCA)  Social History:   Married, 2 girls  Occupation: Press photographer  Patient has never smoked.  Alcohol Use - no  Illicit Drug Use - no    Past Medical History  Diagnosis Date  . Arthritis   . Lactose intolerance   . Irritable bowel syndrome   . External hemorrhoid   . Gastric polyp   . GERD (gastroesophageal reflux disease)   . Hiatal hernia     History   Social History  . Marital Status: Married    Spouse Name: N/A    Number of Children: N/A  . Years of Education: N/A   Occupational History  . Not on file.   Social History Main Topics  . Smoking status: Never Smoker   . Smokeless tobacco: Never Used  . Alcohol Use: Yes     rare   . Drug Use: Not on file  . Sexually Active: Not on file   Other Topics Concern  . Not on file   Social History Narrative  . No narrative on file    Past Surgical History  Procedure Date  . Knee surgery   . Tubal ligation   . Breast surgery cluster cyst removed     x 2  . Sinus surgery 1984  . [facility]left palate repaired  at [facility]  . Esophagogastroduodenoscopy 2009  . Colonoscopy 2004    Family History  Problem Relation Age of Onset  . Cancer Mother     ovarian   . Kidney disease Mother   . Osteoporosis Mother   . Cancer Father     lung  . COPD Father   . Cancer Sister 37    breast   . Irritable bowel syndrome Daughter   . Heart disease Maternal Grandfather   . Cancer Other     colon    Not on File  Current Outpatient Prescriptions on File Prior to Visit  Medication Sig Dispense Refill  . Cyanocobalamin (NASCOBAL) 500 MCG/0.1ML SOLN One spray, one nostril, once a week........Marland Kitchen MUST HAVE OFFICE VISIT  1 Bottle  0  . pantoprazole (PROTONIX) 40 MG tablet TAKE ONE TABLET BY MOUTH EVERY DAY  60 tablet  0    BP 128/86  Pulse 109  Temp(Src) 98.5 F (36.9 C) (Oral)  Ht 5' 7.5" (1.715 m)  Wt 236 lb (107.049 kg)   BMI 36.42 kg/m2  SpO2 95%       Review of Systems  Constitutional: Negative.   HENT: Negative for hearing loss, congestion, sore throat, rhinorrhea, dental problem, sinus pressure and tinnitus.   Eyes: Negative for pain, discharge and visual disturbance.  Respiratory: Negative for cough and shortness of breath.   Cardiovascular: Negative for chest pain, palpitations and leg swelling.  Gastrointestinal: Negative for nausea, vomiting, abdominal pain, diarrhea, constipation, blood in stool and abdominal distention.  Genitourinary: Negative for dysuria, urgency, frequency, hematuria, flank pain, vaginal bleeding, vaginal discharge, difficulty urinating, vaginal pain and pelvic pain.  Musculoskeletal: Positive for back pain and gait problem. Negative for joint swelling and arthralgias.  Skin: Negative for rash.  Neurological: Negative for dizziness, syncope, speech difficulty, weakness, numbness and headaches.  Hematological: Negative for adenopathy.  Psychiatric/Behavioral: Negative for behavioral problems, dysphoric mood and agitation. The patient is not nervous/anxious.        Objective:   Physical Exam  Constitutional: She is oriented to person, place, and time. She appears well-developed and well-nourished.  HENT:  Head: Normocephalic and atraumatic.  Right Ear: External ear normal.  Left Ear: External ear normal.  Mouth/Throat: Oropharynx is clear and moist.  Eyes: Conjunctivae and EOM are normal.  Neck: Normal range of motion. Neck supple. No JVD present. No thyromegaly present.  Cardiovascular: Normal rate, regular rhythm, normal heart sounds and intact distal pulses.   No murmur heard. Pulmonary/Chest: Effort normal and breath sounds normal. She has no wheezes. She has no rales.  Abdominal: Soft. Bowel sounds are normal. She exhibits no distension and no mass. There is no tenderness. There is no rebound and no guarding.  Musculoskeletal: Normal range of motion. She  exhibits no edema and no tenderness.  Neurological: She is alert and oriented to person, place, and time. She has normal reflexes. No cranial nerve deficit. She exhibits normal muscle tone. Coordination normal.   monofilament testing decreased on the left.  Skin: Skin is warm and dry. No rash noted.  Psychiatric: She has a normal mood and affect. Her behavior is normal.          Assessment & Plan:   Preventive health examination Reflex sympathetic dystrophy B12 deficiency. Options were discussed we'll continue  monthly injections  Exogenous obesity.  Impaired glucose tolerance.   Abnormal TSH.  Recheck 3 months.  Consider supplementation if worsening

## 2015-08-03 NOTE — Patient Instructions (Signed)
Limit your sodium (Salt) intake    It is important that you exercise regularly, at least 20 minutes 3 to 4 times per week.  If you develop chest pain or shortness of breath seek  medical attention.  You need to lose weight.  Consider a lower calorie diet and regular exercise.  Menopause is a normal process in which your reproductive ability comes to an end. This process happens gradually over a span of months to years, usually between the ages of 12 and 55. Menopause is complete when you have missed 12 consecutive menstrual periods. It is important to talk with your health care provider about some of the most common conditions that affect postmenopausal women, such as heart disease, cancer, and bone loss (osteoporosis). Adopting a healthy lifestyle and getting preventive care can help to promote your health and wellness. Those actions can also lower your chances of developing some of these common conditions. WHAT SHOULD I KNOW ABOUT MENOPAUSE? During menopause, you may experience a number of symptoms, such as:  Moderate-to-severe hot flashes.  Night sweats.  Decrease in sex drive.  Mood swings.  Headaches.  Tiredness.  Irritability.  Memory problems.  Insomnia. Choosing to treat or not to treat menopausal changes is an individual decision that you make with your health care provider. WHAT SHOULD I KNOW ABOUT HORMONE REPLACEMENT THERAPY AND SUPPLEMENTS? Hormone therapy products are effective for treating symptoms that are associated with menopause, such as hot flashes and night sweats. Hormone replacement carries certain risks, especially as you become older. If you are thinking about using estrogen or estrogen with progestin treatments, discuss the benefits and risks with your health care provider. WHAT SHOULD I KNOW ABOUT HEART DISEASE AND STROKE? Heart disease, heart attack, and stroke become more likely as you age. This may be due, in part, to the hormonal changes that your body  experiences during menopause. These can affect how your body processes dietary fats, triglycerides, and cholesterol. Heart attack and stroke are both medical emergencies. There are many things that you can do to help prevent heart disease and stroke:  Have your blood pressure checked at least every 1-2 years. High blood pressure causes heart disease and increases the risk of stroke.  If you are 56-27 years old, ask your health care provider if you should take aspirin to prevent a heart attack or a stroke.  Do not use any tobacco products, including cigarettes, chewing tobacco, or electronic cigarettes. If you need help quitting, ask your health care provider.  It is important to eat a healthy diet and maintain a healthy weight.  Be sure to include plenty of vegetables, fruits, low-fat dairy products, and lean protein.  Avoid eating foods that are high in solid fats, added sugars, or salt (sodium).  Get regular exercise. This is one of the most important things that you can do for your health.  Try to exercise for at least 150 minutes each week. The type of exercise that you do should increase your heart rate and make you sweat. This is known as moderate-intensity exercise.  Try to do strengthening exercises at least twice each week. Do these in addition to the moderate-intensity exercise.  Know your numbers.Ask your health care provider to check your cholesterol and your blood glucose. Continue to have your blood tested as directed by your health care provider. WHAT SHOULD I KNOW ABOUT CANCER SCREENING? There are several types of cancer. Take the following steps to reduce your risk and to catch any cancer  development as early as possible. Breast Cancer  Practice breast self-awareness.  This means understanding how your breasts normally appear and feel.  It also means doing regular breast self-exams. Let your health care provider know about any changes, no matter how small.  If you  are 41 or older, have a clinician do a breast exam (clinical breast exam or CBE) every year. Depending on your age, family history, and medical history, it may be recommended that you also have a yearly breast X-ray (mammogram).  If you have a family history of breast cancer, talk with your health care provider about genetic screening.  If you are at high risk for breast cancer, talk with your health care provider about having an MRI and a mammogram every year.  Breast cancer (BRCA) gene test is recommended for women who have family members with BRCA-related cancers. Results of the assessment will determine the need for genetic counseling and BRCA1 and for BRCA2 testing. BRCA-related cancers include these types:  Breast. This occurs in males or females.  Ovarian.  Tubal. This may also be called fallopian tube cancer.  Cancer of the abdominal or pelvic lining (peritoneal cancer).  Prostate.  Pancreatic. Cervical, Uterine, and Ovarian Cancer Your health care provider may recommend that you be screened regularly for cancer of the pelvic organs. These include your ovaries, uterus, and vagina. This screening involves a pelvic exam, which includes checking for microscopic changes to the surface of your cervix (Pap test).  For women ages 21-65, health care providers may recommend a pelvic exam and a Pap test every three years. For women ages 14-65, they may recommend the Pap test and pelvic exam, combined with testing for human papilloma virus (HPV), every five years. Some types of HPV increase your risk of cervical cancer. Testing for HPV may also be done on women of any age who have unclear Pap test results.  Other health care providers may not recommend any screening for nonpregnant women who are considered low risk for pelvic cancer and have no symptoms. Ask your health care provider if a screening pelvic exam is right for you.  If you have had past treatment for cervical cancer or a condition  that could lead to cancer, you need Pap tests and screening for cancer for at least 20 years after your treatment. If Pap tests have been discontinued for you, your risk factors (such as having a new sexual partner) need to be reassessed to determine if you should start having screenings again. Some women have medical problems that increase the chance of getting cervical cancer. In these cases, your health care provider may recommend that you have screening and Pap tests more often.  If you have a family history of uterine cancer or ovarian cancer, talk with your health care provider about genetic screening.  If you have vaginal bleeding after reaching menopause, tell your health care provider.  There are currently no reliable tests available to screen for ovarian cancer. Lung Cancer Lung cancer screening is recommended for adults 92-3 years old who are at high risk for lung cancer because of a history of smoking. A yearly low-dose CT scan of the lungs is recommended if you:  Currently smoke.  Have a history of at least 30 pack-years of smoking and you currently smoke or have quit within the past 15 years. A pack-year is smoking an average of one pack of cigarettes per day for one year. Yearly screening should:  Continue until it has been 15  years since you quit.  Stop if you develop a health problem that would prevent you from having lung cancer treatment. Colorectal Cancer  This type of cancer can be detected and can often be prevented.  Routine colorectal cancer screening usually begins at age 56 and continues through age 43.  If you have risk factors for colon cancer, your health care provider may recommend that you be screened at an earlier age.  If you have a family history of colorectal cancer, talk with your health care provider about genetic screening.  Your health care provider may also recommend using home test kits to check for hidden blood in your stool.  A small camera at  the end of a tube can be used to examine your colon directly (sigmoidoscopy or colonoscopy). This is done to check for the earliest forms of colorectal cancer.  Direct examination of the colon should be repeated every 5-10 years until age 18. However, if early forms of precancerous polyps or small growths are found or if you have a family history or genetic risk for colorectal cancer, you may need to be screened more often. Skin Cancer  Check your skin from head to toe regularly.  Monitor any moles. Be sure to tell your health care provider:  About any new moles or changes in moles, especially if there is a change in a mole's shape or color.  If you have a mole that is larger than the size of a pencil eraser.  If any of your family members has a history of skin cancer, especially at a young age, talk with your health care provider about genetic screening.  Always use sunscreen. Apply sunscreen liberally and repeatedly throughout the day.  Whenever you are outside, protect yourself by wearing long sleeves, pants, a wide-brimmed hat, and sunglasses. WHAT SHOULD I KNOW ABOUT OSTEOPOROSIS? Osteoporosis is a condition in which bone destruction happens more quickly than new bone creation. After menopause, you may be at an increased risk for osteoporosis. To help prevent osteoporosis or the bone fractures that can happen because of osteoporosis, the following is recommended:  If you are 56-55 years old, get at least 1,000 mg of calcium and at least 600 mg of vitamin D per day.  If you are older than age 41 but younger than age 36, get at least 1,200 mg of calcium and at least 600 mg of vitamin D per day.  If you are older than age 29, get at least 1,200 mg of calcium and at least 800 mg of vitamin D per day. Smoking and excessive alcohol intake increase the risk of osteoporosis. Eat foods that are rich in calcium and vitamin D, and do weight-bearing exercises several times each week as directed by  your health care provider. WHAT SHOULD I KNOW ABOUT HOW MENOPAUSE AFFECTS [place]? Depression may occur at any age, but it is more common as you become older. Common symptoms of depression include:  Low or sad mood.  Changes in sleep patterns.  Changes in appetite or eating patterns.  Feeling an overall lack of motivation or enjoyment of activities that you previously enjoyed.  Frequent crying spells. Talk with your health care provider if you think that you are experiencing depression. WHAT SHOULD I KNOW ABOUT IMMUNIZATIONS? It is important that you get and maintain your immunizations. These include:  Tetanus, diphtheria, and pertussis (Tdap) booster vaccine.  Influenza every year before the flu season begins.  Pneumonia vaccine.  Shingles vaccine. Your health  care provider may also recommend other immunizations.   This information is not intended to replace advice given to you by your health care provider. Make sure you discuss any questions you have with your health care provider.   Document Released: 06/13/2005 Document Revised: 05/12/2014 Document Reviewed: 12/22/2013 Elsevier Interactive Patient Education [employer].

## 2015-08-03 NOTE — Progress Notes (Signed)
Pre visit review using our clinic review tool, if applicable. No additional management support is needed unless otherwise documented below in the visit note. 

## 2015-08-21 ENCOUNTER — Encounter: Payer: Self-pay | Admitting: Obstetrics & Gynecology

## 2015-08-21 ENCOUNTER — Ambulatory Visit (INDEPENDENT_AMBULATORY_CARE_PROVIDER_SITE_OTHER): Payer: BLUE CROSS/BLUE SHIELD | Admitting: Obstetrics & Gynecology

## 2015-08-21 VITALS — BP 140/92 | HR 66 | Resp 16 | Ht 66.25 in | Wt 240.0 lb

## 2015-08-21 DIAGNOSIS — Z8041 Family history of malignant neoplasm of ovary: Secondary | ICD-10-CM | POA: Diagnosis not present

## 2015-08-21 DIAGNOSIS — Z01419 Encounter for gynecological examination (general) (routine) without abnormal findings: Secondary | ICD-10-CM

## 2015-08-21 DIAGNOSIS — Z205 Contact with and (suspected) exposure to viral hepatitis: Secondary | ICD-10-CM

## 2015-08-21 DIAGNOSIS — Z124 Encounter for screening for malignant neoplasm of cervix: Secondary | ICD-10-CM | POA: Diagnosis not present

## 2015-08-21 NOTE — Progress Notes (Signed)
65 y.o. [id number] MarriedCaucasianF here for annual exam.  Denies vaginal bleeding.  Reports elevated blood glucose with lab work in March.  Reports HBA1C was then done and normal.  I cannot see this lab test.   Pt has MMG scheduled in early May.  After this, will have MRI scheduled.  Pt has a sister diagnosed with breast cancer at aged 21.  Recurrence this past year and now with bilateral mastectomy.  Patient's last menstrual period was 05/05/1996.          Sexually active: Yes.    The current method of family planning is post menopausal status.    Exercising: No.  The patient does not participate in regular exercise at present. Smoker:  no  Health Maintenance: Pap:  05/31/13 Neg. HR HPV:Neg History of abnormal Pap:  yes MMG:  09/05/14 BIRADS1:Neg. Has appt 09/13/15 Colonoscopy:  02/2008 repeat 5 years  BMD:   07/06/13 Normal  TDaP:  2012 Zostavax:  Would do this. Screening Labs: PCP, Urine today: PCP   reports that she has never smoked. She has never used smokeless tobacco. She reports that she does not drink alcohol or use illicit drugs.  Past Medical History  Diagnosis Date  . Arthritis   . Lactose intolerance   . Irritable bowel syndrome   . External hemorrhoid   . Gastric polyp   . GERD (gastroesophageal reflux disease)   . Hiatal hernia   . Cleft palate     repaired at birth  . RSD (reflex sympathetic dystrophy)   . BRCA negative 2/13    1, 2 negative-BART negative 09/10/11  . Kidney stone   . Family history of breast cancer   . Family history of ovarian cancer   . Family history of colon cancer   . Fracture of knee region 10/2014    Bilateral     Past Surgical History  Procedure Laterality Date  . Knee surgery Left 1976  . Tubal ligation  1987  . Breast surgery cluster cyst removed      x 2  . Cleft palate repaired  birth, 66    at [facility]  . Urethral sling  11/13    mid-urethral sling  . Incisional breast biopsy  1997, 2003  . Patella fracture surgery  Bilateral 11/2014    Current Outpatient Prescriptions  Medication Sig Dispense Refill  . aspirin 81 MG tablet Take 81 mg by mouth daily.     . Calcium 1500 MG tablet Take 1,500 mg by mouth.    . Cholecalciferol (VITAMIN D) 2000 UNITS CAPS Take 2 capsules by mouth 2 (two) times daily.    . cyanocobalamin (,VITAMIN B-12,) 1000 MCG/ML injection INJECT 1 ML (1000 MCG TOTAL) INTO THE MUSCLE EVERY 30 DAYS 30 mL 0  . Eszopiclone (ESZOPICLONE) 3 MG TABS Take 1 tablet (3 mg total) by mouth at bedtime. 90 tablet 1  . fluticasone (FLONASE) 50 MCG/ACT nasal spray PLACE TWO SPRAYS INTO THE NOSE DAILY 16 g 5  . Omega 3-6-9 Fatty Acids (OMEGA 3-6-9 COMPLEX PO) Take by mouth daily.    Marland Kitchen oxymorphone (OPANA) 5 MG tablet Take 5 mg by mouth every 4 (four) hours as needed for pain.    . pantoprazole (PROTONIX) 40 MG tablet Take 1 tablet (40 mg total) by mouth daily. 90 tablet 3  . SYRINGE-NEEDLE, DISP, 3 ML (BD ECLIPSE SYRINGE) 25G X 1" 3 ML MISC Use to inject Vit B12 every 30 days 12 each 1   No current  facility-administered medications for this visit.    Family History  Problem Relation Age of Onset  . Kidney disease Mother   . Osteoporosis Mother   . Heart attack Mother     pacemaker  . Other Mother 59    meningioma-removed on 05/26/13  . Ovarian cancer Mother 79  . Brain cancer Mother 53    meningioma  . COPD Father   . Lung cancer Father 45  . Breast cancer Sister 63  . Irritable bowel syndrome Daughter   . Heart disease Maternal Grandfather   . Breast cancer Other     MGM's two sisters  . Leukemia Maternal Grandmother   . Breast cancer Maternal Aunt 60  . Brain cancer Maternal Uncle   . Colon cancer Paternal Aunt   . Stroke Paternal Grandfather   . Colon cancer Sister 36  . Breast cancer Maternal Aunt   . Colon cancer Maternal Aunt   . Lymphoma Maternal Aunt   . Breast cancer Maternal Aunt   . Brain cancer Maternal Aunt   . Lung cancer Maternal Uncle     smoker  . Colon cancer  Maternal Uncle   . Breast cancer Other     MGM's mother  . Breast cancer Cousin 34    maternal first cousin    ROS:  Pertinent items are noted in HPI.  Otherwise, a comprehensive ROS was negative.  Exam:   BP 140/92 mmHg  Pulse 66  Resp 16  Ht 5' 6.25" (1.683 m)  Wt 240 lb (108.863 kg)  BMI 38.43 kg/m2  LMP 05/05/1996  Weight change:  +2#Height: 5' 6.25" (168.3 cm)  Ht Readings from Last 3 Encounters:  08/21/15 5' 6.25" (1.683 m)  08/03/15 5' 6.5" (1.689 m)  08/02/14 '5\' 6"'  (1.676 m)    General appearance: alert, cooperative and appears stated age Head: Normocephalic, without obvious abnormality, atraumatic Neck: no adenopathy, supple, symmetrical, trachea midline and thyroid normal to inspection and palpation Lungs: clear to auscultation bilaterally Breasts: normal appearance, no masses or tenderness Heart: regular rate and rhythm Abdomen: soft, non-tender; bowel sounds normal; no masses,  no organomegaly Extremities: extremities normal, atraumatic, no cyanosis or edema Skin: Skin color, texture, turgor normal. No rashes or lesions Lymph nodes: Cervical, supraclavicular, and axillary nodes normal. No abnormal inguinal nodes palpated Neurologic: Grossly normal   Pelvic: External genitalia:  no lesions              Urethra:  normal appearing urethra with no masses, tenderness or lesions              Bartholins and Skenes: normal                 Vagina: normal appearing vagina with normal color and discharge, no lesions              Cervix: no lesions              Pap taken: Yes.   Bimanual Exam:  Uterus:  normal size, contour, position, consistency, mobility, non-tender              Adnexa: normal adnexa and no mass, fullness, tenderness               Rectovaginal: Confirms               Anus:  normal sphincter tone, no lesions  Chaperone was present for exam.  A:  Well Woman with normal exam Strong family hx of breast cancer--four family  members.  Sister with  recurrence, aged 71 with initial diagnosis aged 67 Mother with ovarian cancer H/O recurrent vulvar boils--no recent issues Small endocervical polyp noted on u/s. Have not been able to see on exam.  Conservative management has been recommended. Small uterine fibroids  P: Mammogram D/W pt. Will do 3D this year. This would be breast MRI year. Pt ended up paying for part of breast MRI so she will consider. Breast cancer risk 38.9%.  Pt has done full genetic testing which was negative. pap smear with HR HPV 1/15. Pap obtained today. Ca-125 and TVUS 2015.  Ca-125 today and will wait to schedule ultrasound depending on this result. Will get Zostavax with Dr. Raliegh Ip the next time she sees him. AEX 1 year or follow-up prn.

## 2015-08-22 LAB — HEPATITIS C ANTIBODY: HCV AB: NEGATIVE

## 2015-08-22 LAB — IPS PAP TEST WITH REFLEX TO HPV

## 2015-08-22 LAB — CA 125: CA 125: 15 U/mL (ref <35)

## 2015-09-12 ENCOUNTER — Ambulatory Visit: Payer: BLUE CROSS/BLUE SHIELD

## 2015-09-13 ENCOUNTER — Ambulatory Visit: Payer: BLUE CROSS/BLUE SHIELD

## 2015-09-25 ENCOUNTER — Ambulatory Visit: Payer: BLUE CROSS/BLUE SHIELD

## 2015-09-25 ENCOUNTER — Ambulatory Visit
Admission: RE | Admit: 2015-09-25 | Discharge: 2015-09-25 | Disposition: A | Discharge status: Home / Self Care | Payer: BLUE CROSS/BLUE SHIELD | Source: Ambulatory Visit

## 2015-09-25 DIAGNOSIS — Z1231 Encounter for screening mammogram for malignant neoplasm of breast: Secondary | ICD-10-CM

## 2015-10-02 ENCOUNTER — Encounter: Payer: Self-pay | Admitting: Gastroenterology

## 2015-11-02 ENCOUNTER — Telehealth: Payer: Self-pay | Admitting: Emergency Medicine

## 2015-11-02 DIAGNOSIS — Z8041 Family history of malignant neoplasm of ovary: Secondary | ICD-10-CM

## 2015-11-02 DIAGNOSIS — Z1239 Encounter for other screening for malignant neoplasm of breast: Secondary | ICD-10-CM

## 2015-11-02 DIAGNOSIS — Z9189 Other specified personal risk factors, not elsewhere classified: Secondary | ICD-10-CM

## 2015-11-02 DIAGNOSIS — Z803 Family history of malignant neoplasm of breast: Secondary | ICD-10-CM

## 2015-11-02 NOTE — Telephone Encounter (Signed)
-----   Message from Megan Salon, MD sent at 10/31/2015  8:16 AM EDT ----- Regarding: Pt needs MRI scheduled Strong family hx of breat cancer.  Is having every other year MRI but last one was 2014.  Can you see if can schedule?  Thanks.  MSM

## 2015-11-02 NOTE — Telephone Encounter (Signed)
Called patient and left detailed message okay per designated party release form that order for Breast MRI was sent to [place] and phone number given for appointment. Advised to please call back if she chooses not to schedule MRI at this time.   Approval obtained from [place] through 12/01/15.  Patient has scheduled MRI 11/14/15 at 1315.

## 2015-11-14 ENCOUNTER — Ambulatory Visit
Admission: RE | Admit: 2015-11-14 | Discharge: 2015-11-14 | Disposition: A | Discharge status: Home / Self Care | Payer: BLUE CROSS/BLUE SHIELD | Source: Ambulatory Visit | Attending: Obstetrics & Gynecology | Admitting: Obstetrics & Gynecology

## 2015-11-14 DIAGNOSIS — Z1239 Encounter for other screening for malignant neoplasm of breast: Secondary | ICD-10-CM

## 2015-11-14 DIAGNOSIS — Z803 Family history of malignant neoplasm of breast: Secondary | ICD-10-CM

## 2015-11-14 DIAGNOSIS — Z9189 Other specified personal risk factors, not elsewhere classified: Secondary | ICD-10-CM

## 2015-11-14 DIAGNOSIS — Z8041 Family history of malignant neoplasm of ovary: Secondary | ICD-10-CM

## 2015-11-14 MED ORDER — GADOBENATE DIMEGLUMINE 529 MG/ML IV SOLN
19.0000 mL | Freq: Once | INTRAVENOUS | Status: AC | PRN
  Administered 2015-11-14: 19 mL via INTRAVENOUS

## 2015-11-19 ENCOUNTER — Telehealth: Payer: Self-pay | Admitting: Emergency Medicine

## 2015-11-19 NOTE — Telephone Encounter (Signed)
-----   Message from Megan Salon, MD sent at 11/16/2015  6:34 AM EDT ----- Please inform pt that her breast MRI was negative.  If in any hold/recall, ok to remove.

## 2015-11-19 NOTE — Telephone Encounter (Signed)
Patient informed that MRI was results were normal and will follow up as scheduled.  Routing to provider for final review. Patient agreeable to disposition. Will close encounter.

## 2016-06-13 ENCOUNTER — Encounter: Payer: Self-pay | Admitting: Internal Medicine

## 2016-07-30 ENCOUNTER — Other Ambulatory Visit: Payer: BLUE CROSS/BLUE SHIELD

## 2016-08-05 ENCOUNTER — Encounter: Payer: Self-pay | Admitting: Internal Medicine

## 2016-08-05 ENCOUNTER — Ambulatory Visit (INDEPENDENT_AMBULATORY_CARE_PROVIDER_SITE_OTHER): Payer: BLUE CROSS/BLUE SHIELD | Admitting: Internal Medicine

## 2016-08-05 VITALS — BP 142/82 | HR 72 | Temp 97.8°F | Ht 66.25 in | Wt 242.6 lb

## 2016-08-05 DIAGNOSIS — Z0000 Encounter for general adult medical examination without abnormal findings: Secondary | ICD-10-CM | POA: Diagnosis not present

## 2016-08-05 LAB — CBC WITH DIFFERENTIAL/PLATELET
BASOS ABS: 0 10*3/uL (ref 0.0–0.1)
Basophils Relative: 0.5 % (ref 0.0–3.0)
Eosinophils Absolute: 0.1 10*3/uL (ref 0.0–0.7)
Eosinophils Relative: 0.7 % (ref 0.0–5.0)
HCT: 45.7 % (ref 36.0–46.0)
HEMOGLOBIN: 15.7 g/dL — AB (ref 12.0–15.0)
LYMPHS PCT: 35.3 % (ref 12.0–46.0)
Lymphs Abs: 3 10*3/uL (ref 0.7–4.0)
MCHC: 34.4 g/dL (ref 30.0–36.0)
MCV: 89.9 fl (ref 78.0–100.0)
Monocytes Absolute: 0.7 10*3/uL (ref 0.1–1.0)
Monocytes Relative: 8 % (ref 3.0–12.0)
NEUTROS ABS: 4.7 10*3/uL (ref 1.4–7.7)
Neutrophils Relative %: 55.5 % (ref 43.0–77.0)
PLATELETS: 249 10*3/uL (ref 150.0–400.0)
RBC: 5.08 Mil/uL (ref 3.87–5.11)
RDW: 13.7 % (ref 11.5–15.5)
WBC: 8.5 10*3/uL (ref 4.0–10.5)

## 2016-08-05 LAB — COMPREHENSIVE METABOLIC PANEL
ALT: 11 U/L (ref 0–35)
AST: 9 U/L (ref 0–37)
Albumin: 4.2 g/dL (ref 3.5–5.2)
Alkaline Phosphatase: 77 U/L (ref 39–117)
BILIRUBIN TOTAL: 0.6 mg/dL (ref 0.2–1.2)
BUN: 16 mg/dL (ref 6–23)
CALCIUM: 9.3 mg/dL (ref 8.4–10.5)
CO2: 32 meq/L (ref 19–32)
Chloride: 103 mEq/L (ref 96–112)
Creatinine, Ser: 0.83 mg/dL (ref 0.40–1.20)
GFR: 73.46 mL/min (ref >60.00)
Glucose, Bld: 87 mg/dL (ref 70–99)
Potassium: 3.7 mEq/L (ref 3.5–5.1)
Sodium: 142 mEq/L (ref 135–145)
Total Protein: 6.6 g/dL (ref 6.0–8.3)

## 2016-08-05 LAB — LIPID PANEL
CHOL/HDL RATIO: 6
Cholesterol: 228 mg/dL — ABNORMAL HIGH (ref 0–200)
HDL: 37.7 mg/dL — AB (ref >39.00)
LDL CALC: 154 mg/dL — AB (ref 0–99)
NonHDL: 190.47
TRIGLYCERIDES: 180 mg/dL — AB (ref 0.0–149.0)
VLDL: 36 mg/dL (ref 0.0–40.0)

## 2016-08-05 LAB — TSH: TSH: 5.29 u[IU]/mL — ABNORMAL HIGH (ref 0.35–4.50)

## 2016-08-05 MED ORDER — CYANOCOBALAMIN 1000 MCG/ML IJ SOLN: INTRAMUSCULAR | 0 refills | Status: DC

## 2016-08-05 MED ORDER — "SYRINGE/NEEDLE (DISP) 25G X 1"" 3 ML MISC": 1 refills | Status: DC

## 2016-08-05 MED ORDER — FLUTICASONE PROPIONATE 50 MCG/ACT NA SUSP: NASAL | 5 refills | Status: AC

## 2016-08-05 MED ORDER — PANTOPRAZOLE SODIUM 40 MG PO TBEC: 40.0000 mg | DELAYED_RELEASE_TABLET | Freq: Every day | ORAL | 3 refills | Status: DC

## 2016-08-05 NOTE — Patient Instructions (Signed)
Limit your sodium (Salt) intake    It is important that you exercise regularly, at least 20 minutes 3 to 4 times per week.  If you develop chest pain or shortness of breath seek  medical attention.  You need to lose weight.  Consider a lower calorie diet and regular exercise.  Please check your blood pressure on a regular basis.  If it is consistently greater than 140/90, please make an office appointment.  Gynecology follow-up as scheduled

## 2016-08-05 NOTE — Progress Notes (Signed)
Pre visit review using our clinic review tool, if applicable. No additional management support is needed unless otherwise documented below in the visit note. 

## 2016-08-05 NOTE — Progress Notes (Signed)
Subjective:    Patient ID: Haley Jenkins, female    DOB: [birth year], 66 y.o.   MRN: [id number]  HPI  66 year old patient who is seen today for a preventive health examination. She is followed closely by gynecology annually due to a family history of ovarian cancer.  She is scheduled for GYN follow-up soon.  Evaluation will include lab as well as a pelvic ultrasound.  She did have genetic testing performed in March 2016 that was negative She is doing well today. Last colonoscopy October 2009 This have annual eye examinations Is followed by chronic pain management due to chronic pain involving her left leg. She has a history of B12 deficiency. No new concerns or complaints  Past Medical History:  Diagnosis Date  . Arthritis   . BRCA negative 2/13   1, 2 negative-BART negative 09/10/11  . Cleft palate    repaired at birth  . External hemorrhoid   . Family history of breast cancer   . Family history of colon cancer   . Family history of ovarian cancer   . Fracture of knee region 10/2014   Bilateral   . Gastric polyp   . GERD (gastroesophageal reflux disease)   . Hiatal hernia   . Irritable bowel syndrome   . Kidney stone   . Lactose intolerance   . RSD (reflex sympathetic dystrophy)      Social History   Social History  . Marital status: Married    Spouse name: N/A  . Number of children: 2  . Years of education: N/A   Occupational History  . Not on file.   Social History Main Topics  . Smoking status: Never Smoker  . Smokeless tobacco: Never Used  . Alcohol use No  . Drug use: No  . Sexual activity: Yes    Partners: Male    Birth control/ protection: Surgical     Comment: BTSP   Other Topics Concern  . Not on file   Social History Narrative  . No narrative on file    Past Surgical History:  Procedure Laterality Date  . breast surgery cluster cyst removed     x 2  . cleft palate repaired  birth, 68   at [place], 2003  . KNEE SURGERY Left 1976  . PATELLA FRACTURE SURGERY Bilateral 11/2014  . TUBAL LIGATION  1987  . URETHRAL SLING  11/13   mid-urethral sling    Family History  Problem Relation Age of Onset  . Kidney disease Mother   . Osteoporosis Mother   . Heart attack Mother     pacemaker  . Other Mother 91    meningioma-removed on 05/26/13  . Ovarian cancer Mother 44  . Brain cancer Mother 86    meningioma  . COPD Father   . Lung cancer Father 61  . Breast cancer Sister 32  . Irritable bowel syndrome Daughter   . Heart disease Maternal Grandfather   . Breast cancer Other     MGM's two sisters  . Leukemia Maternal Grandmother   . Breast cancer Maternal Aunt 60  . Brain cancer Maternal Uncle   . Colon cancer Paternal Aunt   . Stroke Paternal Grandfather   . Colon cancer Sister 14  . Breast cancer Maternal Aunt   . Colon cancer Maternal Aunt   . Lymphoma Maternal Aunt   . Breast cancer Maternal Aunt   . Brain cancer Maternal Aunt   .  Lung cancer Maternal Uncle     smoker  . Colon cancer Maternal Uncle   . Breast cancer Other     MGM's mother  . Breast cancer Cousin 50    maternal first cousin    Allergies  Allergen Reactions  . Morphine And Related Rash    Hallucinationa  . Sulfa Antibiotics Nausea And Vomiting    Current Outpatient Prescriptions on File Prior to Visit  Medication Sig Dispense Refill  . aspirin 81 MG tablet Take 81 mg by mouth daily.     . Calcium 1500 MG tablet Take 1,500 mg by mouth.    . Cholecalciferol (VITAMIN D) 2000 UNITS CAPS Take 2 capsules by mouth 2 (two) times daily.    . Eszopiclone (ESZOPICLONE) 3 MG TABS Take 1 tablet (3 mg total) by mouth at bedtime. 90 tablet 1  . Omega 3-6-9 Fatty Acids (OMEGA 3-6-9 COMPLEX PO) Take by mouth daily.    . oxymorphone (OPANA) 5 MG tablet Take 5 mg by mouth every 4 (four) hours as needed for pain.     No current facility-administered medications on file prior to visit.     BP (!) 142/82 (BP  Location: Left Arm, Patient Position: Sitting, Cuff Size: Normal)   Pulse 72   Temp 97.8 F (36.6 C) (Oral)   Ht 5' 6.25" (1.683 m)   Wt 242 lb 9.6 oz (110 kg)   LMP 05/05/1996   SpO2 97%   BMI 38.86 kg/m     Review of Systems  Constitutional: Negative.   HENT: Negative for congestion, dental problem, hearing loss, rhinorrhea, sinus pressure, sore throat and tinnitus.   Eyes: Negative for pain, discharge and visual disturbance.  Respiratory: Negative for cough and shortness of breath.   Cardiovascular: Negative for chest pain, palpitations and leg swelling.  Gastrointestinal: Negative for abdominal distention, abdominal pain, blood in stool, constipation, diarrhea, nausea and vomiting.  Genitourinary: Negative for difficulty urinating, dysuria, flank pain, frequency, hematuria, pelvic pain, urgency, vaginal bleeding, vaginal discharge and vaginal pain.  Musculoskeletal: Negative for arthralgias, gait problem and joint swelling.       Chronic left leg pain  Skin: Negative for rash.  Neurological: Negative for dizziness, syncope, speech difficulty, weakness, numbness and headaches.  Hematological: Negative for adenopathy.  Psychiatric/Behavioral: Negative for agitation, behavioral problems and dysphoric mood. The patient is not nervous/anxious.        Objective:   Physical Exam  Constitutional: She is oriented to person, place, and time. She appears well-developed and well-nourished.  Blood pressure 134/80 Weight 242  HENT:  Head: Normocephalic and atraumatic.  Right Ear: External ear normal.  Left Ear: External ear normal.  Mouth/Throat: Oropharynx is clear and moist.  Eyes: Conjunctivae and EOM are normal.  Neck: Normal range of motion. Neck supple. No JVD present. No thyromegaly present.  Cardiovascular: Normal rate, regular rhythm, normal heart sounds and intact distal pulses.   No murmur heard. Pulmonary/Chest: Effort normal and breath sounds normal. She has no wheezes.  She has no rales.  Abdominal: Soft. Bowel sounds are normal. She exhibits no distension and no mass. There is no tenderness. There is no rebound and no guarding.  Musculoskeletal: Normal range of motion. She exhibits no edema or tenderness.  Neurological: She is alert and oriented to person, place, and time. She has normal reflexes. No cranial nerve deficit. She exhibits normal muscle tone. Coordination normal.  Skin: Skin is warm and dry. No rash noted.  Psychiatric: She has a normal mood   and affect. Her behavior is normal.          Assessment & Plan:   Preventive health exam.   B12 deficiency Chronic pain syndrome Exogenous obesity Family history of gynecologic cancer  Medications updated We'll check follow-up lab GYN follow-up as scheduled  Weight loss encouraged .  Home blood pressure monitoring.  Encouraged Recheck one year or as needed  KWIATKOWSKI,PETER FRANK  

## 2016-08-26 ENCOUNTER — Other Ambulatory Visit (HOSPITAL_COMMUNITY)
Admission: RE | Admit: 2016-08-26 | Discharge: 2016-08-26 | Disposition: A | Discharge status: Home / Self Care | Payer: BLUE CROSS/BLUE SHIELD | Source: Ambulatory Visit | Attending: Obstetrics & Gynecology | Admitting: Obstetrics & Gynecology

## 2016-08-26 ENCOUNTER — Ambulatory Visit (INDEPENDENT_AMBULATORY_CARE_PROVIDER_SITE_OTHER): Payer: BLUE CROSS/BLUE SHIELD | Admitting: Obstetrics & Gynecology

## 2016-08-26 ENCOUNTER — Encounter: Payer: Self-pay | Admitting: Obstetrics & Gynecology

## 2016-08-26 VITALS — BP 162/84 | HR 90 | Resp 20 | Ht 66.0 in | Wt 247.6 lb

## 2016-08-26 DIAGNOSIS — Z8041 Family history of malignant neoplasm of ovary: Secondary | ICD-10-CM | POA: Diagnosis not present

## 2016-08-26 DIAGNOSIS — E039 Hypothyroidism, unspecified: Secondary | ICD-10-CM

## 2016-08-26 DIAGNOSIS — Z01419 Encounter for gynecological examination (general) (routine) without abnormal findings: Secondary | ICD-10-CM | POA: Diagnosis not present

## 2016-08-26 DIAGNOSIS — Z124 Encounter for screening for malignant neoplasm of cervix: Secondary | ICD-10-CM

## 2016-08-26 MED ORDER — LEVOTHYROXINE SODIUM 50 MCG PO TABS: 50.0000 ug | ORAL_TABLET | Freq: Every day | ORAL | 3 refills | Status: DC

## 2016-08-26 NOTE — Progress Notes (Signed)
66 y.o. [id number] Married Caucasian female here for annual exam.    PCP:  Bluford Kaufmann, MD  Patient's last menstrual period was 05/05/1996.        Sexually active: Yes.    The current method of family planning is Tubal/post menopausal status.    Exercising: No.   Smoker:  no  Health Maintenance: Pap: 08-21-15 Neg; 05/31/13 Neg:Neg HR HPV History of abnormal Pap:  no MMG: 09-25-15 Density B/Neg/BiRads1:TBC--Bil.MRI of Br.11-14-15/Neg/BiRads1:TBC Colonoscopy: 10/09 normal with Dr.[name]-repeat rec.every 5 years--patient notified repeat due 2019. BMD:  07-06-13  Result  normal TDaP:  2012 Pneumonia vaccination: not started Shingles vaccination:  Interested in this Hep C: 08-21-15 neg Screening Labs:  Hb today: PCP, Urine today: not done   reports that she has never smoked. She has never used smokeless tobacco. She reports that she does not drink alcohol or use drugs.  Past Medical History:  Diagnosis Date  . Arthritis   . BRCA negative 2/13   1, 2 negative-BART negative 09/10/11  . Cleft palate    repaired at birth  . External hemorrhoid   . Family history of breast cancer   . Family history of colon cancer   . Family history of ovarian cancer   . Fracture of knee region 10/2014   Bilateral   . Gastric polyp   . GERD (gastroesophageal reflux disease)   . Hiatal hernia   . Irritable bowel syndrome   . Kidney stone   . Lactose intolerance   . RSD (reflex sympathetic dystrophy)     Past Surgical History:  Procedure Laterality Date  . breast surgery cluster cyst removed     x 2  . cleft palate repaired  birth, 67   at [place], 2003  . KNEE SURGERY Left 1976  . PATELLA FRACTURE SURGERY Bilateral 11/2014  . TUBAL LIGATION  1987  . URETHRAL SLING  11/13   mid-urethral sling    Current Outpatient Prescriptions  Medication Sig Dispense Refill  . aspirin 81 MG tablet Take 81 mg by mouth daily.     . Calcium 1500 MG tablet Take  1,500 mg by mouth.    . Cholecalciferol (VITAMIN D) 2000 UNITS CAPS Take 2 capsules by mouth 2 (two) times daily.    . cyanocobalamin (,VITAMIN B-12,) 1000 MCG/ML injection INJECT 1 ML (1000 MCG TOTAL) INTO THE MUSCLE EVERY 30 DAYS 30 mL 0  . eszopiclone (LUNESTA) 2 MG TABS tablet Take 2 mg by mouth at bedtime as needed for sleep. Take immediately before bedtime    . fluticasone (FLONASE) 50 MCG/ACT nasal spray PLACE TWO SPRAYS INTO THE NOSE DAILY 16 g 5  . Omega 3-6-9 Fatty Acids (OMEGA 3-6-9 COMPLEX PO) Take by mouth daily.    Marland Kitchen oxymorphone (OPANA) 10 MG tablet Take 10 mg by mouth every 4 (four) hours as needed for pain.    . pantoprazole (PROTONIX) 40 MG tablet Take 1 tablet (40 mg total) by mouth daily. 90 tablet 3  . SYRINGE-NEEDLE, DISP, 3 ML (BD ECLIPSE SYRINGE) 25G X 1" 3 ML MISC Use to inject Vit B12 every 30 days 12 each 1   No current facility-administered medications for this visit.     Family History  Problem Relation Age of Onset  . Kidney disease Mother   . Osteoporosis Mother   . Heart attack Mother     pacemaker  . Other Mother 65    meningioma-removed on 05/26/13  .  Ovarian cancer Mother 78  . Brain cancer Mother 9    meningioma  . COPD Father   . Lung cancer Father 14  . Breast cancer Sister 78  . Irritable bowel syndrome Daughter   . Heart disease Maternal Grandfather   . Breast cancer Other     MGM's two sisters  . Leukemia Maternal Grandmother   . Breast cancer Maternal Aunt 60  . Brain cancer Maternal Uncle   . Colon cancer Paternal Aunt   . Stroke Paternal Grandfather   . Colon cancer Sister 70  . Breast cancer Maternal Aunt   . Colon cancer Maternal Aunt   . Lymphoma Maternal Aunt   . Breast cancer Maternal Aunt   . Brain cancer Maternal Aunt   . Lung cancer Maternal Uncle     smoker  . Colon cancer Maternal Uncle   . Breast cancer Other     MGM's mother  . Breast cancer Cousin 46    maternal first cousin    ROS:  Pertinent items are noted  in HPI.  Otherwise, a comprehensive ROS was negative.  Exam:   BP (!) 162/84 (BP Location: Right Arm, Patient Position: Sitting, Cuff Size: Large)   Pulse 90   Resp 20   Ht _0  (1.676 m)   Wt 247 lb 9.6 oz (112.3 kg)   LMP 05/05/1996   BMI 39.96 kg/m     General appearance: alert, cooperative and appears stated age Head: Normocephalic, without obvious abnormality, atraumatic Neck: no adenopathy, supple, symmetrical, trachea midline and thyroid normal to inspection and palpation Lungs: clear to auscultation bilaterally Breasts: normal appearance, no masses or tenderness, No nipple retraction or dimpling, No nipple discharge or bleeding, No axillary or supraclavicular adenopathy Heart: regular rate and rhythm Abdomen: soft, non-tender; no masses, no organomegaly Extremities: extremities normal, atraumatic, no cyanosis or edema Skin: Skin color, texture, turgor normal. No rashes or lesions Lymph nodes: Cervical, supraclavicular, and axillary nodes normal. No abnormal inguinal nodes palpated Neurologic: Grossly normal  Pelvic: External genitalia:  no lesions              Urethra:  normal appearing urethra with no masses, tenderness or lesions              Bartholins and Skenes: normal                 Vagina: normal appearing vagina with normal color and discharge, no lesions              Cervix: no lesions              Pap taken: Yes.   Bimanual Exam:  Uterus:  normal size, contour, position, consistency, mobility, non-tender              Adnexa: no mass, fullness, tenderness              Rectal exam: Yes.  .  Confirms.              Anus:  normal sphincter tone, no lesions  Chaperone was present for exam.  Assessment:   Well woman visit with normal exam PMP, no HRT Strong family xh of brast cancer--four family members.  Sister diagnosed age 44 and recurrence last year age 83.  Genetic testing was negative. Family hx of ovarian cancer in mother Family hx of colon cancer in her  sister. H/O small endocervical polyp on ultrasound.  Cannot see on exam and pt has no VB H/O  small uterine fibroids Fluctuating TSH  Plan: 3D MMG scheduled.  Will plan yearly MRI due to family hx. Pap and HR HPV today Colonoscopy due next year.  Will be 10 years.  Did check with GI about this last year. Shingles vaccination order given today Ca-125 today and return for PUS Start synthroid 67mg daily.  #30/3RF.  Return two months for repeat TSH Lab work, otherwise, done with Dr. KRaliegh IpAEX 1 year or follow up prn

## 2016-08-27 LAB — CYTOLOGY - PAP
DIAGNOSIS: NEGATIVE
HPV (WINDOPATH): NOT DETECTED

## 2016-08-27 LAB — CA 125: CA 125: 16 U/mL (ref <35)

## 2016-10-27 ENCOUNTER — Telehealth: Payer: Self-pay | Admitting: Obstetrics & Gynecology

## 2016-10-27 NOTE — Telephone Encounter (Signed)
Patient had a death in the family and has canceled her ultrasound for 10/28/16. She would like to reschedule this appt/rd

## 2016-10-27 NOTE — Telephone Encounter (Signed)
Spoke with patient. Patient states that she is unable to attend PUS appointment tomorrow due to death in family. Would like to reschedule. PUS appointment moved to 11/06/2016 at 3 pm with 3:30 pm consult with Dr.Miller. Patient is agreeable to date and time.  Routing to provider for final review. Patient agreeable to disposition. Will close encounter.

## 2016-10-28 ENCOUNTER — Other Ambulatory Visit: Payer: BLUE CROSS/BLUE SHIELD | Admitting: Obstetrics & Gynecology

## 2016-10-28 ENCOUNTER — Other Ambulatory Visit: Payer: BLUE CROSS/BLUE SHIELD

## 2016-11-04 ENCOUNTER — Other Ambulatory Visit: Payer: Self-pay | Admitting: Internal Medicine

## 2016-11-04 HISTORY — PX: GANGLION CYST EXCISION: SHX1691

## 2016-11-06 ENCOUNTER — Ambulatory Visit (INDEPENDENT_AMBULATORY_CARE_PROVIDER_SITE_OTHER): Payer: BLUE CROSS/BLUE SHIELD | Admitting: Obstetrics & Gynecology

## 2016-11-06 ENCOUNTER — Ambulatory Visit (INDEPENDENT_AMBULATORY_CARE_PROVIDER_SITE_OTHER): Payer: BLUE CROSS/BLUE SHIELD

## 2016-11-06 DIAGNOSIS — Z8041 Family history of malignant neoplasm of ovary: Secondary | ICD-10-CM

## 2016-11-06 DIAGNOSIS — E039 Hypothyroidism, unspecified: Secondary | ICD-10-CM

## 2016-11-06 DIAGNOSIS — Z803 Family history of malignant neoplasm of breast: Secondary | ICD-10-CM

## 2016-11-06 NOTE — Addendum Note (Signed)
Addended by: Karmen Bongo T on: 11/06/2016 03:52 PM   Modules accepted: Orders

## 2016-11-06 NOTE — Progress Notes (Signed)
Provider was called to OR for Emergency Surgery with Dr. Quincy Simmonds.  Pt will be called with results.

## 2016-11-07 LAB — TSH: TSH: 2.77 u[IU]/mL (ref 0.450–4.500)

## 2016-11-09 ENCOUNTER — Encounter: Payer: Self-pay | Admitting: Obstetrics & Gynecology

## 2016-11-12 ENCOUNTER — Encounter: Payer: Self-pay | Admitting: Obstetrics & Gynecology

## 2016-11-18 ENCOUNTER — Telehealth: Payer: Self-pay | Admitting: Obstetrics & Gynecology

## 2016-11-18 NOTE — Telephone Encounter (Signed)
Patient last mammogram 11-14-15. Advised to proceed with scheduling screening mammogram for this year. Patient agreeable.

## 2016-11-18 NOTE — Telephone Encounter (Signed)
Call to patient to review surgery date options.  Patient initially requested surgery within next two weeks.Advised that generally takes 6-8 weeks to schedule elective surgery. First available date is 12-15-16 and patient is agreeable. Surgery instructions reviewed and printed copy will be mailed. Encouraged to call back if additional questions before appointment with Dr Sabra Heck on 11-28-16.   Routing to provider for final review. Patient agreeable to disposition. Will close encounter.   Routing to Dr Quincy Simmonds covering for Dr Sabra Heck out of office this week.

## 2016-11-18 NOTE — Telephone Encounter (Signed)
Spoke with pt regarding benefit for recommended surgery. Patient understood and agreeable. Patient ready to schedule. Patient aware this is professional benefit only. Patient aware will be contacted by hospital for separate benefits. Patient is aware of surgery cancellation policy. Forwarding to Conservation officer, historic buildings for scheduling.   Routing to Lamont Snowball, RN

## 2016-11-26 ENCOUNTER — Other Ambulatory Visit: Payer: Self-pay | Admitting: Obstetrics & Gynecology

## 2016-11-28 ENCOUNTER — Other Ambulatory Visit (HOSPITAL_COMMUNITY)
Admission: RE | Admit: 2016-11-28 | Discharge: 2016-11-28 | Disposition: A | Discharge status: Home / Self Care | Payer: BLUE CROSS/BLUE SHIELD | Source: Ambulatory Visit | Attending: Obstetrics & Gynecology | Admitting: Obstetrics & Gynecology

## 2016-11-28 ENCOUNTER — Ambulatory Visit: Payer: BLUE CROSS/BLUE SHIELD | Admitting: Obstetrics & Gynecology

## 2016-11-28 ENCOUNTER — Encounter: Payer: Self-pay | Admitting: Obstetrics & Gynecology

## 2016-11-28 VITALS — BP 142/92 | HR 76 | Resp 14 | Ht 66.0 in | Wt 248.2 lb

## 2016-11-28 DIAGNOSIS — Z124 Encounter for screening for malignant neoplasm of cervix: Secondary | ICD-10-CM

## 2016-11-28 DIAGNOSIS — R9389 Abnormal findings on diagnostic imaging of other specified body structures: Secondary | ICD-10-CM

## 2016-11-28 DIAGNOSIS — Z8041 Family history of malignant neoplasm of ovary: Secondary | ICD-10-CM | POA: Diagnosis not present

## 2016-11-28 DIAGNOSIS — R938 Abnormal findings on diagnostic imaging of other specified body structures: Secondary | ICD-10-CM | POA: Diagnosis not present

## 2016-11-28 DIAGNOSIS — Z01419 Encounter for gynecological examination (general) (routine) without abnormal findings: Secondary | ICD-10-CM | POA: Diagnosis present

## 2016-11-28 DIAGNOSIS — D251 Intramural leiomyoma of uterus: Secondary | ICD-10-CM

## 2016-11-28 MED ORDER — IBUPROFEN 800 MG PO TABS: 800.0000 mg | ORAL_TABLET | Freq: Three times a day (TID) | ORAL | 0 refills | Status: DC | PRN

## 2016-11-28 MED ORDER — OXYCODONE-ACETAMINOPHEN 5-325 MG PO TABS: 2.0000 | ORAL_TABLET | Freq: Four times a day (QID) | ORAL | 0 refills | Status: DC | PRN

## 2016-11-28 MED ORDER — LEVOTHYROXINE SODIUM 50 MCG PO TABS: 50.0000 ug | ORAL_TABLET | Freq: Every day | ORAL | 4 refills | Status: DC

## 2016-11-28 NOTE — Progress Notes (Signed)
66 y.o. [id number] Married Caucasian female here to discuss her desires to proceed with hysterectomy after recent ultrasound performed 11/06/16.  Pt has strong family hx of gyn cancers including breast and ovarian cancer.  She has known fibroids.  She has been undergoing yearly ultrasounds for evaluation of ovaries specifically.  This year, her endometrium appeared thickened.  She has not experienced bleeding.  She is absolutely sure that with fibroids, family hx of ovarian cancer, and with possible endometrial polyp, she wants to proceed with hysterectomy and BSO.  She worries about her cancer risks from gyn standpoint and is ready to not worry about this anymore.  She does need an endometrial biopsy which will be performed today.  We did discuss on the telephone biopsy vs hysteroscopy and continued yearly ultrasounds but she is sure she does not want to continue with this plan and desires definitive treatment.  Procedure discussed with patient.  Hospital stay, recovery and pain management all discussed.  Risks discussed including but not limited to bleeding, 1% risk of receiving a  transfusion, infection, 3-4% risk of bowel/bladder/ureteral/vascular injury discussed as well as possible need for additional surgery if injury does occur discussed.  DVT/PE and rare risk of death discussed.  My actual complications with prior surgeries discussed.  Vaginal cuff dehiscence discussed.  Hernia formation discussed.  Positioning and incision locations discussed.  Patient aware if pathology abnormal she may need additional treatment.  All questions answered.    After reviewing, she still strongly desires to proceed with definitive treatment and especially desires removal of her ovaries.  Of note, she genetic testing with BRCA testing and then later BART testing--all of this was negative.   Ob Hx:   Patient's last menstrual period was 05/05/1996.          Sexually active: Yes.   Birth control: bilateral tubal  ligation Last pap: 08/26/16 negative, HR HPV negative  Last MMG: 11/14/15 BIRADS 1 negative  Tobacco: Never smoker   Past Surgical History:  Procedure Laterality Date  . breast surgery cluster cyst removed     x 2  . cleft palate repaired  birth, 75   at [facility], 2003  . KNEE SURGERY Left 1976  . PATELLA FRACTURE SURGERY Bilateral 11/2014  . TUBAL LIGATION  1987  . URETHRAL SLING  11/13   mid-urethral sling    Past Medical History:  Diagnosis Date  . Arthritis   . BRCA negative 2/13   1, 2 negative-BART negative 09/10/11  . Cleft palate    repaired at birth  . External hemorrhoid   . Family history of breast cancer   . Family history of colon cancer   . Family history of ovarian cancer   . Fracture of knee region 10/2014   Bilateral   . Gastric polyp   . GERD (gastroesophageal reflux disease)   . Hiatal hernia   . Irritable bowel syndrome   . Kidney stone   . Lactose intolerance   . RSD (reflex sympathetic dystrophy)     Allergies: Morphine and related and Sulfa antibiotics  Current Outpatient Prescriptions  Medication Sig Dispense Refill  . aspirin 81 MG tablet Take 81 mg by mouth daily.     . Calcium 1500 MG tablet Take 1,500 mg by mouth.    . Cholecalciferol (VITAMIN D) 2000 UNITS CAPS Take 2 capsules by mouth 2 (two) times daily.    . cyanocobalamin (,VITAMIN B-12,) 1000 MCG/ML injection INJECT ONE  ML (CC) INTRAMUSCULARLY ONCE EVERY MONTH 3 mL 9  . escitalopram (LEXAPRO) 10 MG tablet   1  . eszopiclone (LUNESTA) 2 MG TABS tablet Take 2 mg by mouth at bedtime as needed for sleep. Take immediately before bedtime    . fluticasone (FLONASE) 50 MCG/ACT nasal spray PLACE TWO SPRAYS INTO THE NOSE DAILY 16 g 5  . levothyroxine (SYNTHROID) 50 MCG tablet Take 1 tablet (50 mcg total) by mouth daily. One po qd 30 tablet 3  . Omega 3-6-9 Fatty Acids (OMEGA 3-6-9 COMPLEX PO) Take by mouth daily.    Marland Kitchen oxymorphone (OPANA) 10 MG tablet  Take 10 mg by mouth every 4 (four) hours as needed for pain.    . pantoprazole (PROTONIX) 40 MG tablet Take 1 tablet (40 mg total) by mouth daily. 90 tablet 3  . SYRINGE-NEEDLE, DISP, 3 ML (BD ECLIPSE SYRINGE) 25G X 1" 3 ML MISC Use to inject Vit B12 every 30 days 12 each 1   No current facility-administered medications for this visit.     ROS: A comprehensive review of systems was negative.  Exam:    BP (!) 142/92 (BP Location: Right Arm, Patient Position: Sitting, Cuff Size: Large)   Pulse 76   Resp 14   Ht '5\' 6"'  (1.676 m)   Wt 248 lb 4 oz (112.6 kg)   LMP 05/05/1996   BMI 40.07 kg/m   General appearance: alert and cooperative Head: Normocephalic, without obvious abnormality, atraumatic Neck: no adenopathy, supple, symmetrical, trachea midline and thyroid not enlarged, symmetric, no tenderness/mass/nodules Lungs: clear to auscultation bilaterally Heart: regular rate and rhythm, S1, S2 normal, no murmur, click, rub or gallop Abdomen: soft, non-tender; bowel sounds normal; no masses,  no organomegaly Extremities: extremities normal, atraumatic, no cyanosis or edema Skin: Skin color, texture, turgor normal. No rashes or lesions Lymph nodes: Cervical, supraclavicular, and axillary nodes normal. no inguinal nodes palpated Neurologic: Grossly normal  Pelvic: External genitalia:  no lesions              Urethra: normal appearing urethra with no masses, tenderness or lesions              Bartholins and Skenes: normal                 Vagina: normal appearing vagina with normal color and discharge, no lesions              Cervix: normal appearance              Pap taken: No.        Bimanual Exam:  Uterus:  uterus is normal size, shape, consistency and nontender                                      Adnexa:    normal adnexa in size, nontender and no masses                                      Rectovaginal: Deferred                                      Anus:  no lesions  Endometrial  biopsy recommended.  Discussed with patient.  Verbal and written consent obtained.  Procedure:  Speculum placed.  Cervix visualized and cleansed with betadine prep.  A single toothed tenaculum was applied to the anterior lip of the cervix.  Endometrial pipelle was advanced through the cervix into the endometrial cavity without difficulty.  Pipelle passed to 7cm.  Suction applied and pipelle removed with good tissue sample obtained.  Tenculum removed.  No bleeding noted.  Patient tolerated procedure well.   A: Uterine fibroids  Endometrial thickened, possible polyp, biopsy performed today Family hx of breast cancer with four family members.  Sister diagnosed age 62 and had recurrent at age 77 Family hx of ovarian cancer in her mother On chronic pain medications, has pain contract    P:  TLH/BSO/cystoscopy planned Pap smear obtained Rx for Motrin and Percocet given. Medications/Vitamins reviewed.  Pt knows needs to stop any ASA products. Hysterectomy brochure given for pre and post op instructions. Synthroid 19mg qd #90/4RF to pharmacy

## 2016-12-02 ENCOUNTER — Ambulatory Visit: Payer: BLUE CROSS/BLUE SHIELD | Admitting: Obstetrics & Gynecology

## 2016-12-03 ENCOUNTER — Encounter: Payer: Self-pay | Admitting: Obstetrics & Gynecology

## 2016-12-03 LAB — CYTOLOGY - PAP: DIAGNOSIS: NEGATIVE

## 2016-12-04 NOTE — Patient Instructions (Addendum)
Your procedure is scheduled on: Monday December 15, 2016 at 7:30 am  Enter through the [facility] at: 6:00 am  Pick up the phone at the desk and dial [phone].  Call this number if you have problems the morning of surgery: [phone].  Remember: Do NOT eat food or drink any liquids after: Midnight on Sunday August 12th  Take these medicines the morning of surgery with a SIP OF WATER: Synthroid, Protonix, Lexapro, opana  STOP ALL VITAMINS AND SUPPLEMENTS TODAY  Do NOT wear jewelry (body piercing), metal hair clips/bobby pins, make-up, or nail polish. Do NOT wear lotions, powders, or perfumes.  You may wear deoderant. Do NOT shave for 48 hours prior to surgery. Do NOT bring valuables to the hospital. Contacts, dentures, or bridgework may not be worn into surgery.  Leave suitcase in car.  After surgery it may be brought to your room.  For patients admitted to the hospital, checkout time is 11:00 AM the day of discharge.

## 2016-12-05 ENCOUNTER — Other Ambulatory Visit: Payer: Self-pay | Admitting: Obstetrics & Gynecology

## 2016-12-05 ENCOUNTER — Encounter (HOSPITAL_COMMUNITY): Payer: Self-pay

## 2016-12-05 ENCOUNTER — Telehealth: Payer: Self-pay | Admitting: *Deleted

## 2016-12-05 ENCOUNTER — Other Ambulatory Visit: Payer: Self-pay

## 2016-12-05 ENCOUNTER — Encounter (HOSPITAL_COMMUNITY)
Admission: RE | Admit: 2016-12-05 | Discharge: 2016-12-05 | Disposition: A | Discharge status: Home / Self Care | Payer: BLUE CROSS/BLUE SHIELD | Source: Ambulatory Visit | Attending: Obstetrics & Gynecology | Admitting: Obstetrics & Gynecology

## 2016-12-05 DIAGNOSIS — Z8041 Family history of malignant neoplasm of ovary: Secondary | ICD-10-CM | POA: Diagnosis not present

## 2016-12-05 DIAGNOSIS — R7309 Other abnormal glucose: Secondary | ICD-10-CM

## 2016-12-05 DIAGNOSIS — Z01812 Encounter for preprocedural laboratory examination: Secondary | ICD-10-CM | POA: Diagnosis present

## 2016-12-05 DIAGNOSIS — N841 Polyp of cervix uteri: Secondary | ICD-10-CM | POA: Diagnosis not present

## 2016-12-05 DIAGNOSIS — N840 Polyp of corpus uteri: Secondary | ICD-10-CM | POA: Diagnosis not present

## 2016-12-05 DIAGNOSIS — D259 Leiomyoma of uterus, unspecified: Secondary | ICD-10-CM | POA: Diagnosis not present

## 2016-12-05 HISTORY — DX: Hypothyroidism, unspecified: E03.9

## 2016-12-05 LAB — CBC
HCT: 43.1 % (ref 36.0–46.0)
HEMOGLOBIN: 15 g/dL (ref 12.0–15.0)
MCH: 31.5 pg (ref 26.0–34.0)
MCHC: 34.8 g/dL (ref 30.0–36.0)
MCV: 90.5 fL (ref 78.0–100.0)
Platelets: 202 10*3/uL (ref 150–400)
RBC: 4.76 MIL/uL (ref 3.87–5.11)
RDW: 13.4 % (ref 11.5–15.5)
WBC: 6.4 10*3/uL (ref 4.0–10.5)

## 2016-12-05 LAB — BASIC METABOLIC PANEL
ANION GAP: 7 (ref 5–15)
BUN: 14 mg/dL (ref 6–20)
CO2: 26 mmol/L (ref 22–32)
CREATININE: 0.85 mg/dL (ref 0.44–1.00)
Calcium: 9 mg/dL (ref 8.9–10.3)
Chloride: 105 mmol/L (ref 101–111)
GFR calc non Af Amer: >60 mL/min (ref >60)
GLUCOSE: 184 mg/dL — AB (ref 65–99)
Potassium: 3.7 mmol/L (ref 3.5–5.1)
Sodium: 138 mmol/L (ref 135–145)

## 2016-12-05 NOTE — Telephone Encounter (Signed)
Call to to patient regarding lab results.. Left message to call back.

## 2016-12-05 NOTE — Pre-Procedure Instructions (Signed)
Dr. Fransisco Beau viewed EKG and approved it.

## 2016-12-05 NOTE — Progress Notes (Signed)
Order placed for hbA1c due to elevated glucose.

## 2016-12-05 NOTE — Telephone Encounter (Signed)
-----   Message from Megan Salon, MD sent at 12/05/2016 12:38 PM EDT ----- Please let pt know her glucose was 184 with her pre-op labs.  I want her to come in on Monday for a HbA1C.  I need to make sure she isn't diabetic before proceeding as this might change surgical recommendations.  Order placed.

## 2016-12-05 NOTE — Telephone Encounter (Signed)
Patient returning your call.

## 2016-12-05 NOTE — Telephone Encounter (Signed)
Call to patient. Advised of lab result as directed by Dr Sabra Heck. Patient states she had just had a bacon biscuit prior to the appointment with lab work. HgbA1C scheduled for Monday 12-08-16 at 1100.  Encounter closed.

## 2016-12-08 ENCOUNTER — Other Ambulatory Visit: Payer: BLUE CROSS/BLUE SHIELD

## 2016-12-09 LAB — HEMOGLOBIN A1C
HEMOGLOBIN A1C: 6.7 % — AB (ref 4.8–5.6)
Mean Plasma Glucose: 146 mg/dL

## 2016-12-10 ENCOUNTER — Other Ambulatory Visit: Payer: BLUE CROSS/BLUE SHIELD

## 2016-12-12 ENCOUNTER — Telehealth: Payer: Self-pay | Admitting: *Deleted

## 2016-12-12 NOTE — Telephone Encounter (Signed)
Lab results reviewed with Dr Sabra Heck. Call to patient. Advised HgbA1C indicates patient is in diabetic range. This has implications for surgery. Dr Sabra Heck recommends reschedule surgery till she can see PCP for evaluation and treatment. Patient adamantly states " I disagree." States her blood sugar has been up and down for years and was normal at most recent PCP visit in April. PCP did not feel she needed treatment since she was able to control blood sugar with diet; she has simply "fallen off the wagon" the last six months.  Requests we hold off on canceling surgery till Dr Sabra Heck reviews information from PCP and can speak to patient directly.  Patient is aware Dr Sabra Heck is in surgery and will call her when she is available.

## 2016-12-15 ENCOUNTER — Other Ambulatory Visit: Payer: Self-pay | Admitting: Internal Medicine

## 2016-12-15 ENCOUNTER — Ambulatory Visit (HOSPITAL_COMMUNITY)
Admission: RE | Admit: 2016-12-15 | Payer: BLUE CROSS/BLUE SHIELD | Source: Ambulatory Visit | Admitting: Obstetrics & Gynecology

## 2016-12-15 ENCOUNTER — Encounter: Payer: Self-pay | Admitting: Internal Medicine

## 2016-12-15 ENCOUNTER — Encounter (HOSPITAL_COMMUNITY): Admission: RE | Payer: Self-pay | Source: Ambulatory Visit

## 2016-12-15 DIAGNOSIS — E119 Type 2 diabetes mellitus without complications: Secondary | ICD-10-CM | POA: Insufficient documentation

## 2016-12-15 SURGERY — HYSTERECTOMY, TOTAL, LAPAROSCOPIC
Anesthesia: General

## 2016-12-15 MED ORDER — METFORMIN HCL ER 500 MG PO TB24: 1000.0000 mg | ORAL_TABLET | Freq: Every day | ORAL | 1 refills | Status: DC

## 2016-12-15 NOTE — Telephone Encounter (Signed)
Late entry into EPIC.  Spoke to pt on same day as phone note.  Advised of diagnosis of diabetes and need for improved control before elective procedure.  Pt frustrated with recommendation as she felt she did not have diabetes as her PCP as not diagnosed this.  Pt wishes to proceed with surgery.  Advised infection risks increased as HbA1C increases and as pt has not take any medication for improved control of HbA1c, do not think goal of close to 6.0 is unreasonable or unattainable.  Would recommend rescheduling elective surgery for three months with follow-up with PCP and improved HbA1C prior to surgery.  Pt disagrees with diagnosis and recommendations and she advised she would like seek care elsewhere.  Call ended amiably.  She is aware of how to request records for transfer.

## 2016-12-22 ENCOUNTER — Ambulatory Visit: Payer: BLUE CROSS/BLUE SHIELD | Admitting: Obstetrics & Gynecology

## 2017-01-22 ENCOUNTER — Encounter: Payer: Self-pay | Admitting: Internal Medicine

## 2017-01-26 ENCOUNTER — Ambulatory Visit: Payer: BLUE CROSS/BLUE SHIELD | Admitting: Obstetrics & Gynecology

## 2017-02-11 NOTE — Telephone Encounter (Addendum)
Attempting to close encounter from 12/12/16 that will not close for some reason.

## 2017-02-20 DIAGNOSIS — Z79891 Long term (current) use of opiate analgesic: Secondary | ICD-10-CM | POA: Diagnosis not present

## 2017-02-20 DIAGNOSIS — G894 Chronic pain syndrome: Secondary | ICD-10-CM | POA: Diagnosis not present

## 2017-02-20 DIAGNOSIS — M4726 Other spondylosis with radiculopathy, lumbar region: Secondary | ICD-10-CM | POA: Diagnosis not present

## 2017-02-20 DIAGNOSIS — G90522 Complex regional pain syndrome I of left lower limb: Secondary | ICD-10-CM | POA: Diagnosis not present

## 2017-04-17 DIAGNOSIS — M4726 Other spondylosis with radiculopathy, lumbar region: Secondary | ICD-10-CM | POA: Diagnosis not present

## 2017-04-17 DIAGNOSIS — Z79891 Long term (current) use of opiate analgesic: Secondary | ICD-10-CM | POA: Diagnosis not present

## 2017-04-17 DIAGNOSIS — G90522 Complex regional pain syndrome I of left lower limb: Secondary | ICD-10-CM | POA: Diagnosis not present

## 2017-04-17 DIAGNOSIS — G894 Chronic pain syndrome: Secondary | ICD-10-CM | POA: Diagnosis not present

## 2017-04-20 DIAGNOSIS — L570 Actinic keratosis: Secondary | ICD-10-CM | POA: Diagnosis not present

## 2017-04-20 DIAGNOSIS — L910 Hypertrophic scar: Secondary | ICD-10-CM | POA: Diagnosis not present

## 2017-04-20 DIAGNOSIS — L409 Psoriasis, unspecified: Secondary | ICD-10-CM | POA: Diagnosis not present

## 2017-04-20 DIAGNOSIS — Z85828 Personal history of other malignant neoplasm of skin: Secondary | ICD-10-CM | POA: Diagnosis not present

## 2017-05-05 HISTORY — PX: CARPAL TUNNEL RELEASE: SHX101

## 2017-05-26 ENCOUNTER — Other Ambulatory Visit: Payer: Self-pay | Admitting: Obstetrics & Gynecology

## 2017-05-26 DIAGNOSIS — Z1231 Encounter for screening mammogram for malignant neoplasm of breast: Secondary | ICD-10-CM

## 2017-05-28 ENCOUNTER — Ambulatory Visit
Admission: RE | Admit: 2017-05-28 | Discharge: 2017-05-28 | Disposition: A | Discharge status: Home / Self Care | Payer: Medicare Other | Source: Ambulatory Visit | Attending: Obstetrics & Gynecology | Admitting: Obstetrics & Gynecology

## 2017-05-28 DIAGNOSIS — G5602 Carpal tunnel syndrome, left upper limb: Secondary | ICD-10-CM | POA: Diagnosis not present

## 2017-05-28 DIAGNOSIS — Z1231 Encounter for screening mammogram for malignant neoplasm of breast: Secondary | ICD-10-CM | POA: Diagnosis not present

## 2017-05-28 DIAGNOSIS — R200 Anesthesia of skin: Secondary | ICD-10-CM | POA: Diagnosis not present

## 2017-06-08 DIAGNOSIS — G5602 Carpal tunnel syndrome, left upper limb: Secondary | ICD-10-CM | POA: Insufficient documentation

## 2017-06-12 DIAGNOSIS — M79642 Pain in left hand: Secondary | ICD-10-CM | POA: Diagnosis not present

## 2017-06-12 DIAGNOSIS — G5602 Carpal tunnel syndrome, left upper limb: Secondary | ICD-10-CM | POA: Diagnosis not present

## 2017-06-19 DIAGNOSIS — G894 Chronic pain syndrome: Secondary | ICD-10-CM | POA: Diagnosis not present

## 2017-06-19 DIAGNOSIS — M4726 Other spondylosis with radiculopathy, lumbar region: Secondary | ICD-10-CM | POA: Diagnosis not present

## 2017-06-19 DIAGNOSIS — G90522 Complex regional pain syndrome I of left lower limb: Secondary | ICD-10-CM | POA: Diagnosis not present

## 2017-06-19 DIAGNOSIS — Z79891 Long term (current) use of opiate analgesic: Secondary | ICD-10-CM | POA: Diagnosis not present

## 2017-07-03 DIAGNOSIS — G5602 Carpal tunnel syndrome, left upper limb: Secondary | ICD-10-CM | POA: Diagnosis not present

## 2017-07-07 ENCOUNTER — Other Ambulatory Visit: Payer: Self-pay | Admitting: *Deleted

## 2017-07-07 DIAGNOSIS — Z803 Family history of malignant neoplasm of breast: Secondary | ICD-10-CM

## 2017-07-16 DIAGNOSIS — Z4889 Encounter for other specified surgical aftercare: Secondary | ICD-10-CM | POA: Insufficient documentation

## 2017-07-17 DIAGNOSIS — M25632 Stiffness of left wrist, not elsewhere classified: Secondary | ICD-10-CM | POA: Diagnosis not present

## 2017-07-28 ENCOUNTER — Ambulatory Visit
Admission: RE | Admit: 2017-07-28 | Discharge: 2017-07-28 | Disposition: A | Discharge status: Home / Self Care | Payer: Medicare Other | Source: Ambulatory Visit | Attending: Obstetrics & Gynecology | Admitting: Obstetrics & Gynecology

## 2017-07-28 DIAGNOSIS — N6489 Other specified disorders of breast: Secondary | ICD-10-CM | POA: Diagnosis not present

## 2017-07-28 DIAGNOSIS — Z803 Family history of malignant neoplasm of breast: Secondary | ICD-10-CM

## 2017-07-28 MED ORDER — GADOBENATE DIMEGLUMINE 529 MG/ML IV SOLN
20.0000 mL | Freq: Once | INTRAVENOUS | Status: AC | PRN
Start: 2017-07-28 — End: 2017-07-28
  Administered 2017-07-28: 20 mL via INTRAVENOUS

## 2017-07-31 DIAGNOSIS — M79644 Pain in right finger(s): Secondary | ICD-10-CM | POA: Diagnosis not present

## 2017-07-31 DIAGNOSIS — M79642 Pain in left hand: Secondary | ICD-10-CM | POA: Diagnosis not present

## 2017-07-31 DIAGNOSIS — G5602 Carpal tunnel syndrome, left upper limb: Secondary | ICD-10-CM | POA: Diagnosis not present

## 2017-07-31 DIAGNOSIS — Z4889 Encounter for other specified surgical aftercare: Secondary | ICD-10-CM | POA: Diagnosis not present

## 2017-08-05 DIAGNOSIS — H2513 Age-related nuclear cataract, bilateral: Secondary | ICD-10-CM | POA: Diagnosis not present

## 2017-08-14 DIAGNOSIS — G894 Chronic pain syndrome: Secondary | ICD-10-CM | POA: Diagnosis not present

## 2017-08-14 DIAGNOSIS — Z79891 Long term (current) use of opiate analgesic: Secondary | ICD-10-CM | POA: Diagnosis not present

## 2017-08-14 DIAGNOSIS — M4726 Other spondylosis with radiculopathy, lumbar region: Secondary | ICD-10-CM | POA: Diagnosis not present

## 2017-08-14 DIAGNOSIS — G90522 Complex regional pain syndrome I of left lower limb: Secondary | ICD-10-CM | POA: Diagnosis not present

## 2017-08-25 ENCOUNTER — Ambulatory Visit (INDEPENDENT_AMBULATORY_CARE_PROVIDER_SITE_OTHER): Payer: Medicare Other | Admitting: Internal Medicine

## 2017-08-25 ENCOUNTER — Encounter: Payer: Self-pay | Admitting: Internal Medicine

## 2017-08-25 VITALS — BP 150/78 | HR 93 | Temp 98.4°F | Ht 67.0 in | Wt 249.0 lb

## 2017-08-25 DIAGNOSIS — E039 Hypothyroidism, unspecified: Secondary | ICD-10-CM

## 2017-08-25 DIAGNOSIS — G9050 Complex regional pain syndrome I, unspecified: Secondary | ICD-10-CM | POA: Diagnosis not present

## 2017-08-25 DIAGNOSIS — K219 Gastro-esophageal reflux disease without esophagitis: Secondary | ICD-10-CM

## 2017-08-25 DIAGNOSIS — Z23 Encounter for immunization: Secondary | ICD-10-CM | POA: Diagnosis not present

## 2017-08-25 DIAGNOSIS — E785 Hyperlipidemia, unspecified: Secondary | ICD-10-CM | POA: Diagnosis not present

## 2017-08-25 DIAGNOSIS — E119 Type 2 diabetes mellitus without complications: Secondary | ICD-10-CM | POA: Diagnosis not present

## 2017-08-25 DIAGNOSIS — E538 Deficiency of other specified B group vitamins: Secondary | ICD-10-CM

## 2017-08-25 DIAGNOSIS — Z0000 Encounter for general adult medical examination without abnormal findings: Secondary | ICD-10-CM | POA: Diagnosis not present

## 2017-08-25 LAB — COMPREHENSIVE METABOLIC PANEL
ALT: 17 U/L (ref 0–35)
AST: 16 U/L (ref 0–37)
Albumin: 4.3 g/dL (ref 3.5–5.2)
Alkaline Phosphatase: 78 U/L (ref 39–117)
BUN: 18 mg/dL (ref 6–23)
CO2: 31 mEq/L (ref 19–32)
CREATININE: 0.8 mg/dL (ref 0.40–1.20)
Calcium: 9.3 mg/dL (ref 8.4–10.5)
Chloride: 102 mEq/L (ref 96–112)
GFR: 76.39 mL/min (ref >60.00)
GLUCOSE: 130 mg/dL — AB (ref 70–99)
Potassium: 4 mEq/L (ref 3.5–5.1)
Sodium: 138 mEq/L (ref 135–145)
TOTAL PROTEIN: 6.5 g/dL (ref 6.0–8.3)
Total Bilirubin: 0.7 mg/dL (ref 0.2–1.2)

## 2017-08-25 LAB — CBC WITH DIFFERENTIAL/PLATELET
BASOS PCT: 0.7 % (ref 0.0–3.0)
Basophils Absolute: 0 10*3/uL (ref 0.0–0.1)
EOS ABS: 0.1 10*3/uL (ref 0.0–0.7)
Eosinophils Relative: 2 % (ref 0.0–5.0)
HEMATOCRIT: 44.9 % (ref 36.0–46.0)
HEMOGLOBIN: 15.4 g/dL — AB (ref 12.0–15.0)
LYMPHS PCT: 25.4 % (ref 12.0–46.0)
Lymphs Abs: 1.4 10*3/uL (ref 0.7–4.0)
MCHC: 34.2 g/dL (ref 30.0–36.0)
MCV: 91.6 fl (ref 78.0–100.0)
MONO ABS: 0.4 10*3/uL (ref 0.1–1.0)
Monocytes Relative: 7.1 % (ref 3.0–12.0)
Neutro Abs: 3.5 10*3/uL (ref 1.4–7.7)
Neutrophils Relative %: 64.8 % (ref 43.0–77.0)
Platelets: 225 10*3/uL (ref 150.0–400.0)
RBC: 4.9 Mil/uL (ref 3.87–5.11)
RDW: 13.7 % (ref 11.5–15.5)
WBC: 5.3 10*3/uL (ref 4.0–10.5)

## 2017-08-25 LAB — LIPID PANEL
CHOLESTEROL: 220 mg/dL — AB (ref 0–200)
HDL: 37.6 mg/dL — AB (ref >39.00)
LDL Cholesterol: 145 mg/dL — ABNORMAL HIGH (ref 0–99)
NONHDL: 182.06
TRIGLYCERIDES: 187 mg/dL — AB (ref 0.0–149.0)
Total CHOL/HDL Ratio: 6
VLDL: 37.4 mg/dL (ref 0.0–40.0)

## 2017-08-25 LAB — MICROALBUMIN / CREATININE URINE RATIO
Creatinine,U: 256.4 mg/dL
MICROALB UR: 3.5 mg/dL — AB (ref 0.0–1.9)
MICROALB/CREAT RATIO: 1.4 mg/g (ref 0.0–30.0)

## 2017-08-25 LAB — HEMOGLOBIN A1C: HEMOGLOBIN A1C: 6.9 % — AB (ref 4.6–6.5)

## 2017-08-25 LAB — VITAMIN B12

## 2017-08-25 LAB — TSH: TSH: 1.35 u[IU]/mL (ref 0.35–4.50)

## 2017-08-25 MED ORDER — LISINOPRIL 10 MG PO TABS: 10.0000 mg | ORAL_TABLET | Freq: Every day | ORAL | 3 refills | Status: DC

## 2017-08-25 MED ORDER — "SYRINGE/NEEDLE (DISP) 25G X 1"" 3 ML MISC": 1 refills | Status: DC

## 2017-08-25 MED ORDER — METFORMIN HCL ER 500 MG PO TB24: 1000.0000 mg | ORAL_TABLET | Freq: Every day | ORAL | 1 refills | Status: DC

## 2017-08-25 NOTE — Patient Instructions (Addendum)
Please check your hemoglobin A1c every 3-6  months  Limit your sodium (Salt) intake  Please check your blood pressure on a regular basis.  If it is consistently greater than 140/90, please make an office appointment.  Please see your eye doctor yearly to check for diabetic eye damage  Schedule your colonoscopy to help detect colon cancer.  Return in 6 weeks for follow-up

## 2017-08-25 NOTE — Progress Notes (Signed)
Subjective:    Patient ID: Haley Jenkins, female    DOB: 1951-03-21, 67 y.o.   MRN: [id number]  HPI  67 year old patient who is seen today for a welcome to Medicare  Visit  She is followed by chronic pain management with a history of RSD.  She has type 2 diabetes and history of hypothyroidism.  Lab Results  Component Value Date   HGBA1C 6.9 (H) 08/25/2017   Past Medical History:  Diagnosis Date  . Arthritis   . BRCA negative 2/13   1, 2 negative-BART negative 09/10/11  . Cleft palate    repaired at birth  . External hemorrhoid   . Family history of breast cancer   . Family history of colon cancer   . Family history of ovarian cancer   . Fracture of knee region 10/2014   Bilateral   . Gastric polyp   . GERD (gastroesophageal reflux disease)   . Hiatal hernia   . History of kidney stones 2015  . Hypothyroidism   . Irritable bowel syndrome   . Kidney stone   . Lactose intolerance   . Pneumonia 2002  . RSD (reflex sympathetic dystrophy)      Social History   Socioeconomic History  . Marital status: Married    Spouse name: Not on file  . Number of children: 2  . Years of education: Not on file  . Highest education level: Not on file  Occupational History  . Not on file  Social Needs  . Financial resource strain: Not on file  . Food insecurity:    Worry: Not on file    Inability: Not on file  . Transportation needs:    Medical: Not on file    Non-medical: Not on file  Tobacco Use  . Smoking status: Never Smoker  . Smokeless tobacco: Never Used  Substance and Sexual Activity  . Alcohol use: No  . Drug use: No  . Sexual activity: Yes    Partners: Male    Birth control/protection: Surgical    Comment: BTSP  Lifestyle  . Physical activity:    Days per week: Not on file    Minutes per session: Not on file  . Stress: Not on file  Relationships  . Social connections:    Talks on phone: Not on file    Gets together: Not on file    Attends religious  service: Not on file    Active member of club or organization: Not on file    Attends meetings of clubs or organizations: Not on file    Relationship status: Not on file  . Intimate partner violence:    Fear of current or ex partner: Not on file    Emotionally abused: Not on file    Physically abused: Not on file    Forced sexual activity: Not on file  Other Topics Concern  . Not on file  Social History Narrative  . Not on file    Past Surgical History:  Procedure Laterality Date  . BREAST BIOPSY Left   . BREAST EXCISIONAL BIOPSY Right   . breast surgery cluster cyst removed     x 2  . cleft palate repaired  birth, 32   at [place] Right 11/04/2016  . [place], 2003  . KNEE ARTHROSCOPY Bilateral 11/2014   [facility]  . KNEE SURGERY Left 1976  . TUBAL LIGATION  1987  . URETHRAL SLING  11/13   mid-urethral sling    Family History  Problem Relation Age of Onset  . Kidney disease Mother   . Osteoporosis Mother   . Heart attack Mother        pacemaker  . Other Mother 2       meningioma-removed on 05/26/13  . Ovarian cancer Mother 27  . Brain cancer Mother 86       meningioma  . COPD Father   . Lung cancer Father 31  . Breast cancer Sister 11       40 at diagnosis.  Recurrence 2017 at 66 yo.  . Irritable bowel syndrome Daughter   . Heart disease Maternal Grandfather   . Breast cancer Other        MGM's two sisters  . Leukemia Maternal Grandmother   . Breast cancer Maternal Aunt 60  . Brain cancer Maternal Uncle   . Colon cancer Paternal Aunt   . Stroke Paternal Grandfather   . Colon cancer Sister 66  . Breast cancer Maternal Aunt   . Colon cancer Maternal Aunt   . Lymphoma Maternal Aunt   . Breast cancer Maternal Aunt   . Brain cancer Maternal Aunt   . Lung cancer Maternal Uncle        smoker  . Colon cancer Maternal Uncle   . Breast cancer Other        MGM's mother  . Breast cancer Cousin 35        maternal first cousin    Allergies  Allergen Reactions  . Morphine And Related Rash and Other (See Comments)    HallucinationS  . Sulfa Antibiotics Nausea And Vomiting and Nausea Only    Current Outpatient Medications on File Prior to Visit  Medication Sig Dispense Refill  . aspirin 81 MG tablet Take 81 mg by mouth daily.     . Calcium-Magnesium-Vitamin D (CALCIUM MAGNESIUM PO) Take 1,500 mg by mouth daily.     . Cholecalciferol (VITAMIN D-3) 5000 units TABS Take 5,000 Units by mouth daily.    . cyanocobalamin (,VITAMIN B-12,) 1000 MCG/ML injection INJECT ONE ML (CC) INTRAMUSCULARLY ONCE EVERY MONTH 3 mL 9  . escitalopram (LEXAPRO) 10 MG tablet Take 10 mg by mouth daily.   1  . Eszopiclone (ESZOPICLONE) 3 MG TABS Take 3 mg by mouth at bedtime. Take immediately before bedtime    . fluticasone (FLONASE) 50 MCG/ACT nasal spray PLACE TWO SPRAYS INTO THE NOSE DAILY (Patient taking differently: Place 2 sprays into both nostrils daily as needed for allergies or rhinitis. PLACE TWO SPRAYS INTO THE NOSE DAILY) 16 g 5  . ibuprofen (ADVIL,MOTRIN) 800 MG tablet Take 1 tablet (800 mg total) by mouth every 8 (eight) hours as needed. 30 tablet 0  . levothyroxine (SYNTHROID) 50 MCG tablet Take 1 tablet (50 mcg total) by mouth daily. One po qd 90 tablet 4  . Omega 3-6-9 Fatty Acids (OMEGA 3-6-9 COMPLEX PO) Take 1 capsule by mouth daily.     Marland Kitchen oxymorphone (OPANA) 10 MG tablet Take 10 mg by mouth 4 (four) times daily as needed for pain.     . pantoprazole (PROTONIX) 40 MG tablet Take 1 tablet (40 mg total) by mouth daily. 90 tablet 3  . triamcinolone cream (KENALOG) 0.5 % Apply 1 application topically 3 (three) times daily.    Marland Kitchen oxyCODONE-acetaminophen (PERCOCET) 5-325 MG tablet Take 2 tablets by mouth every 6 (six) hours as needed. use only as much as needed to relieve pain (Patient  not taking: Reported on 08/25/2017) 30 tablet 0  . traZODone (DESYREL) 50 MG tablet      No current facility-administered  medications on file prior to visit.     BP (!) 150/78 (BP Location: Right Arm, Patient Position: Sitting, Cuff Size: Large)   Pulse 93   Temp 98.4 F (36.9 C) (Oral)   Ht _0  (1.702 m)   Wt 249 lb (112.9 kg)   LMP 05/05/1996   SpO2 94%   BMI 39.00 kg/m   Welcome to Medicare visit  1. Risk factors, based on past  M,S,F history of type 2 diabetes.  2.  Physical activities: Limited due to reflex sympathetic dystrophy and exogenous obesity.  3.  Depression/mood: No history of major depression or mood disorder.  He was treated with escitalopram 10 mg daily  4.  Hearing: No deficits  5.  ADL's: Independent  6.  Fall risk: Moderate due to arthritis and obesity  7.  Home safety: No problems identified  8.  Height weight, and visual acuity; height and weight stable no change in visual acuity;  annual diabetic eye examination encouraged.  Last eye examination 3 weeks ago  9.  Counseling: Heart healthy diet more regular exercise as tolerated encouraged  10. Lab orders based on risk factors: Laboratory update will be reviewed including hemoglobin A1c urine for microalbumin and lipid profile  11. Referral : Follow-up chronic pain management  12. Care plan: Continue efforts at aggressive risk factor modification  13. Cognitive assessment: Alert and appropriate normal affect.  No cognitive dysfunction  14. Screening: Patient provided with a written and personalized 5-10 year screening schedule in the AVS.    15. Provider List Update: Orthopedics primary care ophthalmology gynecology  16.  Healthcare power of attorney and living will encouraged  Review of Systems  Constitutional: Negative.   HENT: Negative for congestion, dental problem, hearing loss, rhinorrhea, sinus pressure, sore throat and tinnitus.   Eyes: Negative for pain, discharge and visual disturbance.  Respiratory: Negative for cough and shortness of breath.   Cardiovascular: Negative for chest pain, palpitations  and leg swelling.  Gastrointestinal: Negative for abdominal distention, abdominal pain, blood in stool, constipation, diarrhea, nausea and vomiting.  Genitourinary: Negative for difficulty urinating, dysuria, flank pain, frequency, hematuria, pelvic pain, urgency, vaginal bleeding, vaginal discharge and vaginal pain.  Musculoskeletal: Positive for arthralgias, back pain and gait problem. Negative for joint swelling.  Skin: Negative for rash.  Neurological: Negative for dizziness, syncope, speech difficulty, weakness, numbness and headaches.  Hematological: Negative for adenopathy.  Psychiatric/Behavioral: Negative for agitation, behavioral problems and dysphoric mood. The patient is not nervous/anxious.        Objective:   Physical Exam  Constitutional: She is oriented to person, place, and time. She appears well-developed and well-nourished.  Weight 249 Blood pressure 160/90  HENT:  Head: Normocephalic and atraumatic.  Right Ear: External ear normal.  Left Ear: External ear normal.  Mouth/Throat: Oropharynx is clear and moist.  Eyes: Conjunctivae and EOM are normal.  Neck: Normal range of motion. Neck supple. No JVD present. No thyromegaly present.  Cardiovascular: Normal rate, regular rhythm, normal heart sounds and intact distal pulses.  No murmur heard. Pulmonary/Chest: Effort normal and breath sounds normal. She has no wheezes. She has no rales.  Abdominal: Soft. Bowel sounds are normal. She exhibits no distension and no mass. There is no tenderness. There is no rebound and no guarding.  Musculoskeletal: Normal range of motion. She exhibits no edema or tenderness.  Neurological: She is alert and oriented to person, place, and time. She has normal reflexes. No cranial nerve deficit. She exhibits normal muscle tone. Coordination normal.  Decreased vibratory sensation and vibratory sensation left lower extremity  Skin: Skin is warm and dry. No rash noted.  Psychiatric: She has a  normal mood and affect. Her behavior is normal.          Assessment & Plan:   Essential hypertension.  We will add lisinopril Diabetes mellitus.  Will review hemoglobin A1c urine for microalbumin and lipid profile Chronic pain syndrome follow-up pain management Welcome to Medicare evaluation  hypothyroidism.  Will review a TSH    Follow-up 3-6 months  Millard Bautch Pilar Plate

## 2017-08-26 LAB — HEPATITIS C ANTIBODY
Hepatitis C Ab: NONREACTIVE
SIGNAL TO CUT-OFF: 0.02 (ref <1.00)

## 2017-08-30 ENCOUNTER — Other Ambulatory Visit: Payer: Self-pay | Admitting: Internal Medicine

## 2017-09-03 ENCOUNTER — Other Ambulatory Visit: Payer: Self-pay

## 2017-09-03 MED ORDER — PANTOPRAZOLE SODIUM 40 MG PO TBEC: 40.0000 mg | DELAYED_RELEASE_TABLET | Freq: Every day | ORAL | 3 refills | Status: DC

## 2017-09-18 ENCOUNTER — Telehealth: Payer: Self-pay | Admitting: Obstetrics & Gynecology

## 2017-09-18 NOTE — Telephone Encounter (Signed)
Left a voicemail for patient on 09/18/17 regarding the doctor cancellation of appointment on 11/06/17. Patient in recall.

## 2017-09-30 ENCOUNTER — Other Ambulatory Visit: Payer: Self-pay

## 2017-09-30 ENCOUNTER — Ambulatory Visit (INDEPENDENT_AMBULATORY_CARE_PROVIDER_SITE_OTHER): Payer: Medicare Other | Admitting: Obstetrics & Gynecology

## 2017-09-30 ENCOUNTER — Encounter: Payer: Self-pay | Admitting: Obstetrics & Gynecology

## 2017-09-30 VITALS — BP 136/80 | HR 80 | Resp 16 | Ht 66.25 in | Wt 249.0 lb

## 2017-09-30 DIAGNOSIS — Z01419 Encounter for gynecological examination (general) (routine) without abnormal findings: Secondary | ICD-10-CM | POA: Diagnosis not present

## 2017-09-30 DIAGNOSIS — E2839 Other primary ovarian failure: Secondary | ICD-10-CM

## 2017-09-30 NOTE — Progress Notes (Signed)
67 y.o. [id number] MarriedCaucasianF here for annual exam.  Doing well.  Denies vaginal bleeding.    Helping to take care of mother.  Having demential testing.  She is 67.  Back on Metformin.  HbA1C was 6.7.  Seeing Dr. Inda Merlin.  He is retiring so she will need a new PCP.    Patient's last menstrual period was 05/05/1996.          Sexually active: Yes.    The current method of family planning is post menopausal status.    Exercising: No.  The patient does not participate in regular exercise at present. Smoker:  no  Health Maintenance: Pap:  11/28/16 Neg   08/26/16 Neg. HR HPV:Neg  History of abnormal Pap:  no MMG:  07/28/17 MRI Bilateral BIRADS2:Benign. F/u 1 year  Colonoscopy:  02/07/2008 Normal.  BMD:   07/06/13 Normal  TDaP:  2012 Pneumonia vaccine(s):  2019 Shingrix:   No Hep C testing: 08/25/17 Neg  Screening Labs: PCP   reports that she has never smoked. She has never used smokeless tobacco. She reports that she does not drink alcohol or use drugs.  Past Medical History:  Diagnosis Date  . Arthritis   . BRCA negative 2/13   1, 2 negative-BART negative 09/10/11  . Cleft palate    repaired at birth  . External hemorrhoid   . Family history of breast cancer   . Family history of colon cancer   . Family history of ovarian cancer   . Fracture of knee region 10/2014   Bilateral   . Gastric polyp   . GERD (gastroesophageal reflux disease)   . Hiatal hernia   . History of kidney stones 2015  . Hypothyroidism   . Irritable bowel syndrome   . Kidney stone   . Lactose intolerance   . Pneumonia 2002  . RSD (reflex sympathetic dystrophy)     Past Surgical History:  Procedure Laterality Date  . BREAST BIOPSY Left   . BREAST EXCISIONAL BIOPSY Right   . breast surgery cluster cyst removed     x 2  . CARPAL TUNNEL RELEASE Left 2019  . cleft palate repaired  birth, 30   at [place] Right 11/04/2016  . [place], 2003  .  KNEE ARTHROSCOPY Bilateral 11/2014   [facility]  . KNEE SURGERY Left 1976  . TUBAL LIGATION  1987  . URETHRAL SLING  11/13   mid-urethral sling    Current Outpatient Medications  Medication Sig Dispense Refill  . aspirin 81 MG tablet Take 81 mg by mouth daily.     . Calcium-Magnesium-Vitamin D (CALCIUM MAGNESIUM PO) Take 1,500 mg by mouth daily.     . Cholecalciferol (VITAMIN D-3) 5000 units TABS Take 5,000 Units by mouth daily.    . cyanocobalamin (,VITAMIN B-12,) 1000 MCG/ML injection INJECT ONE ML (CC) INTRAMUSCULARLY ONCE EVERY MONTH 3 mL 9  . escitalopram (LEXAPRO) 10 MG tablet Take 10 mg by mouth daily.   1  . Eszopiclone (ESZOPICLONE) 3 MG TABS Take 3 mg by mouth at bedtime. Take immediately before bedtime    . fluticasone (FLONASE) 50 MCG/ACT nasal spray PLACE TWO SPRAYS INTO THE NOSE DAILY (Patient taking differently: Place 2 sprays into both nostrils daily as needed for allergies or rhinitis. PLACE TWO SPRAYS INTO THE NOSE DAILY) 16 g 5  . levothyroxine (SYNTHROID) 50 MCG tablet Take 1 tablet (50 mcg total) by mouth daily. One po qd 90  tablet 4  . lisinopril (PRINIVIL,ZESTRIL) 10 MG tablet Take 1 tablet (10 mg total) by mouth daily. 90 tablet 3  . metFORMIN (GLUCOPHAGE-XR) 500 MG 24 hr tablet Take 2 tablets (1,000 mg total) by mouth daily with breakfast. 180 tablet 1  . Omega 3-6-9 Fatty Acids (OMEGA 3-6-9 COMPLEX PO) Take 1 capsule by mouth daily.     Marland Kitchen oxymorphone (OPANA) 10 MG tablet Take 10 mg by mouth 4 (four) times daily as needed for pain.     . pantoprazole (PROTONIX) 40 MG tablet Take 1 tablet (40 mg total) by mouth daily. 90 tablet 3  . SYRINGE-NEEDLE, DISP, 3 ML (BD ECLIPSE SYRINGE) 25G X 1" 3 ML MISC Use to inject Vit B12 every 30 days 12 each 1  . triamcinolone cream (KENALOG) 0.5 % Apply 1 application topically 3 (three) times daily.     No current facility-administered medications for this visit.     Family History  Problem Relation Age of Onset  . Kidney  disease Mother   . Osteoporosis Mother   . Heart attack Mother        pacemaker  . Other Mother 28       meningioma-removed on 05/26/13  . Ovarian cancer Mother 28  . Brain cancer Mother 75       meningioma  . COPD Father   . Lung cancer Father 25  . Breast cancer Sister 6       40 at diagnosis.  Recurrence 2017 at 67 yo.  . Irritable bowel syndrome Daughter   . Heart disease Maternal Grandfather   . Breast cancer Other        MGM's two sisters  . Leukemia Maternal Grandmother   . Breast cancer Maternal Aunt 60  . Brain cancer Maternal Uncle   . Colon cancer Paternal Aunt   . Stroke Paternal Grandfather   . Colon cancer Sister 54  . Breast cancer Maternal Aunt   . Colon cancer Maternal Aunt   . Lymphoma Maternal Aunt   . Breast cancer Maternal Aunt   . Brain cancer Maternal Aunt   . Lung cancer Maternal Uncle        smoker  . Colon cancer Maternal Uncle   . Breast cancer Other        MGM's mother  . Breast cancer Cousin 51       maternal first cousin    Review of Systems  All other systems reviewed and are negative.   Exam:   BP 136/80 (BP Location: Right Arm, Patient Position: Sitting, Cuff Size: Large)   Pulse 80   Resp 16   Ht 5' 6.25" (1.683 m)   Wt 249 lb (112.9 kg)   LMP 05/05/1996   BMI 39.89 kg/m   Height: 5' 6.25" (168.3 cm)  Ht Readings from Last 3 Encounters:  09/30/17 5' 6.25" (1.683 m)  08/25/17 '5\' 7"'  (1.702 m)  12/05/16 '5\' 7"'  (1.702 m)    General appearance: alert, cooperative and appears stated age Head: Normocephalic, without obvious abnormality, atraumatic Neck: no adenopathy, supple, symmetrical, trachea midline and thyroid normal to inspection and palpation Lungs: clear to auscultation bilaterally Breasts: normal appearance, no masses or tenderness Heart: regular rate and rhythm Abdomen: soft, non-tender; bowel sounds normal; no masses,  no organomegaly Extremities: extremities normal, atraumatic, no cyanosis or edema Skin: Skin  color, texture, turgor normal. No rashes or lesions Lymph nodes: Cervical, supraclavicular, and axillary nodes normal. No abnormal inguinal nodes palpated Neurologic: Grossly normal  Pelvic: External genitalia:  no lesions              Urethra:  normal appearing urethra with no masses, tenderness or lesions              Bartholins and Skenes: normal                 Vagina: normal appearing vagina with normal color and discharge, no lesions              Cervix: no lesions              Pap taken: No. Bimanual Exam:  Uterus:  normal size, contour, position, consistency, mobility, non-tender              Adnexa: normal adnexa and no mass, fullness, tenderness               Rectovaginal: Confirms               Anus:  normal sphincter tone, no lesions  Chaperone was present for exam.  A:  Well Woman with normal exam PMP, no HRT Strong family hx of breast cancer--four separate family members.  Sister diagnosed age 33 and recurrence at age 50 Diabetes, recently back on metformin Endometrial polyp Family hx of ovarian cancer in mother Family hx of colon cancer in sister Uterine fibroids Increased family stressors with care of mother  P:   Mammogram guidelines reviewed.  Due to increased risk of breast cancer, also doing yearly MRIs pap smear and HR HPV neg 2018.  Not obtained today Plan BMD this year. Pt continue to desire hysterectomy but needs to handle things with her mother right now.  Blood work with PCP Desires Shingrix vaccination.  Information given.   return annually or prn

## 2017-09-30 NOTE — Patient Instructions (Signed)
[place] at [facility] Dr. Billey Chang Dr. Mauri Pole  [address] [place] [address] [place]: [phone]

## 2017-10-06 ENCOUNTER — Ambulatory Visit (INDEPENDENT_AMBULATORY_CARE_PROVIDER_SITE_OTHER): Payer: Medicare Other | Admitting: Internal Medicine

## 2017-10-06 ENCOUNTER — Encounter: Payer: Self-pay | Admitting: Internal Medicine

## 2017-10-06 VITALS — BP 140/70 | HR 71 | Temp 98.0°F | Wt 247.0 lb

## 2017-10-06 DIAGNOSIS — I10 Essential (primary) hypertension: Secondary | ICD-10-CM

## 2017-10-06 DIAGNOSIS — E119 Type 2 diabetes mellitus without complications: Secondary | ICD-10-CM | POA: Diagnosis not present

## 2017-10-06 MED ORDER — LISINOPRIL 20 MG PO TABS: 20.0000 mg | ORAL_TABLET | Freq: Every day | ORAL | 4 refills | Status: DC

## 2017-10-06 MED ORDER — ATORVASTATIN CALCIUM 20 MG PO TABS: 20.0000 mg | ORAL_TABLET | Freq: Every day | ORAL | 3 refills | Status: DC

## 2017-10-06 MED ORDER — METFORMIN HCL ER 500 MG PO TB24: 500.0000 mg | ORAL_TABLET | Freq: Every day | ORAL | 1 refills | Status: DC

## 2017-10-06 NOTE — Progress Notes (Signed)
Subjective:    Patient ID: Haley Jenkins, female    DOB: 1950-11-25, 67 y.o.   MRN: [id number]  HPI  67 year old patient who has type 2 diabetes who is seen today for follow-up of hypertension. She was seen 6 weeks ago for follow-up and blood pressure was noted to be elevated.  She was placed on lisinopril 10 mg daily.  She has tolerated this medication well.  Repeat blood pressure today improved at 136/72  Hemoglobin A1c 6 weeks ago was 6.9.  She was placed on metformin 1000 mg daily in the morning she has had some nausea and occasional diarrhea.  Symptoms seem to be improving  Past Medical History:  Diagnosis Date  . Arthritis   . BRCA negative 2/13   1, 2 negative-BART negative 09/10/11  . Cleft palate    repaired at birth  . External hemorrhoid   . Family history of breast cancer   . Family history of colon cancer   . Family history of ovarian cancer   . Fracture of knee region 10/2014   Bilateral   . Gastric polyp   . GERD (gastroesophageal reflux disease)   . Hiatal hernia   . History of kidney stones 2015  . Hypothyroidism   . Irritable bowel syndrome   . Kidney stone   . Lactose intolerance   . Pneumonia 2002  . RSD (reflex sympathetic dystrophy)      Social History   Socioeconomic History  . Marital status: Married    Spouse name: Not on file  . Number of children: 2  . Years of education: Not on file  . Highest education level: Not on file  Occupational History  . Not on file  Social Needs  . Financial resource strain: Not on file  . Food insecurity:    Worry: Not on file    Inability: Not on file  . Transportation needs:    Medical: Not on file    Non-medical: Not on file  Tobacco Use  . Smoking status: Never Smoker  . Smokeless tobacco: Never Used  Substance and Sexual Activity  . Alcohol use: No  . Drug use: No  . Sexual activity: Yes    Partners: Male    Birth control/protection: Surgical, Post-menopausal    Comment: BTSP  Lifestyle  .  Physical activity:    Days per week: Not on file    Minutes per session: Not on file  . Stress: Not on file  Relationships  . Social connections:    Talks on phone: Not on file    Gets together: Not on file    Attends religious service: Not on file    Active member of club or organization: Not on file    Attends meetings of clubs or organizations: Not on file    Relationship status: Not on file  . Intimate partner violence:    Fear of current or ex partner: Not on file    Emotionally abused: Not on file    Physically abused: Not on file    Forced sexual activity: Not on file  Other Topics Concern  . Not on file  Social History Narrative  . Not on file    Past Surgical History:  Procedure Laterality Date  . BREAST BIOPSY Left   . BREAST EXCISIONAL BIOPSY Right   . breast surgery cluster cyst removed     x 2  . CARPAL TUNNEL RELEASE Left 2019  . cleft palate repaired  birth, 85  at [place] Right 11/04/2016  . [place], 2003  . KNEE ARTHROSCOPY Bilateral 11/2014   [facility]  . KNEE SURGERY Left 1976  . TUBAL LIGATION  1987  . URETHRAL SLING  11/13   mid-urethral sling    Family History  Problem Relation Age of Onset  . Kidney disease Mother   . Osteoporosis Mother   . Heart attack Mother        pacemaker  . Other Mother 31       meningioma-removed on 05/26/13  . Ovarian cancer Mother 60  . Brain cancer Mother 36       meningioma  . COPD Father   . Lung cancer Father 48  . Breast cancer Sister 33       40 at diagnosis.  Recurrence 2017 at 67 yo.  . Irritable bowel syndrome Daughter   . Heart disease Maternal Grandfather   . Breast cancer Other        MGM's two sisters  . Leukemia Maternal Grandmother   . Breast cancer Maternal Aunt 60  . Brain cancer Maternal Uncle   . Colon cancer Paternal Aunt   . Stroke Paternal Grandfather   . Colon cancer Sister 58  . Breast cancer Maternal Aunt   . Colon  cancer Maternal Aunt   . Lymphoma Maternal Aunt   . Breast cancer Maternal Aunt   . Brain cancer Maternal Aunt   . Lung cancer Maternal Uncle        smoker  . Colon cancer Maternal Uncle   . Breast cancer Other        MGM's mother  . Breast cancer Cousin 61       maternal first cousin    Allergies  Allergen Reactions  . Morphine And Related Rash and Other (See Comments)    HallucinationS  . Sulfa Antibiotics Nausea And Vomiting and Nausea Only    Current Outpatient Medications on File Prior to Visit  Medication Sig Dispense Refill  . aspirin 81 MG tablet Take 81 mg by mouth daily.     . Calcium-Magnesium-Vitamin D (CALCIUM MAGNESIUM PO) Take 1,500 mg by mouth daily.     . Cholecalciferol (VITAMIN D-3) 5000 units TABS Take 5,000 Units by mouth daily.    . cyanocobalamin (,VITAMIN B-12,) 1000 MCG/ML injection INJECT ONE ML (CC) INTRAMUSCULARLY ONCE EVERY MONTH 3 mL 9  . escitalopram (LEXAPRO) 10 MG tablet Take 10 mg by mouth daily.   1  . Eszopiclone (ESZOPICLONE) 3 MG TABS Take 2 mg by mouth at bedtime. Take immediately before bedtime     . fluticasone (FLONASE) 50 MCG/ACT nasal spray PLACE TWO SPRAYS INTO THE NOSE DAILY (Patient taking differently: Place 2 sprays into both nostrils daily as needed for allergies or rhinitis. PLACE TWO SPRAYS INTO THE NOSE DAILY) 16 g 5  . levothyroxine (SYNTHROID) 50 MCG tablet Take 1 tablet (50 mcg total) by mouth daily. One po qd 90 tablet 4  . Omega 3-6-9 Fatty Acids (OMEGA 3-6-9 COMPLEX PO) Take 1 capsule by mouth daily.     Marland Kitchen oxymorphone (OPANA) 10 MG tablet Take 10 mg by mouth 4 (four) times daily as needed for pain.     . pantoprazole (PROTONIX) 40 MG tablet Take 1 tablet (40 mg total) by mouth daily. 90 tablet 3  . SYRINGE-NEEDLE, DISP, 3 ML (BD ECLIPSE SYRINGE) 25G X 1" 3 ML MISC Use to inject Vit B12 every 30 days  12 each 1  . triamcinolone cream (KENALOG) 0.5 % Apply 1 application topically 3 (three) times daily.     No current  facility-administered medications on file prior to visit.     BP 140/70 (BP Location: Right Arm, Patient Position: Sitting, Cuff Size: Large)   Pulse 71   Temp 98 F (36.7 C) (Oral)   Wt 247 lb (112 kg)   LMP 05/05/1996   SpO2 93%   BMI 39.57 kg/m     Review of Systems  Constitutional: Negative.   HENT: Negative for congestion, dental problem, hearing loss, rhinorrhea, sinus pressure, sore throat and tinnitus.   Eyes: Negative for pain, discharge and visual disturbance.  Respiratory: Negative for cough and shortness of breath.   Cardiovascular: Negative for chest pain, palpitations and leg swelling.  Gastrointestinal: Positive for abdominal pain, diarrhea and nausea. Negative for abdominal distention, blood in stool, constipation and vomiting.  Genitourinary: Negative for difficulty urinating, dysuria, flank pain, frequency, hematuria, pelvic pain, urgency, vaginal bleeding, vaginal discharge and vaginal pain.  Musculoskeletal: Negative for arthralgias, gait problem and joint swelling.  Skin: Negative for rash.  Neurological: Negative for dizziness, syncope, speech difficulty, weakness, numbness and headaches.  Hematological: Negative for adenopathy.  Psychiatric/Behavioral: Negative for agitation, behavioral problems and dysphoric mood. The patient is not nervous/anxious.        Objective:   Physical Exam  Constitutional: She appears well-developed and well-nourished.  Blood pressure 136/72  Abdominal: Soft. Bowel sounds are normal.          Assessment & Plan:   Essential hypertension.  Improved we will increase lisinopril to 20 mg daily Diabetes mellitus Metformin intolerance.  Will decrease metformin to 500 mg once daily with breakfast  Follow-up 3 months  Haley Jenkins

## 2017-10-06 NOTE — Patient Instructions (Signed)
Please check your hemoglobin A1c every 3 months   Please check your blood pressure on a regular basis.  If it is consistently greater than 150/90, please make an office appointment.    It is important that you exercise regularly, at least 20 minutes 3 to 4 times per week.  If you develop chest pain or shortness of breath seek  medical attention.  You need to lose weight.  Consider a lower calorie diet and regular exercise.

## 2017-10-12 DIAGNOSIS — Z85828 Personal history of other malignant neoplasm of skin: Secondary | ICD-10-CM | POA: Diagnosis not present

## 2017-10-12 DIAGNOSIS — L570 Actinic keratosis: Secondary | ICD-10-CM | POA: Diagnosis not present

## 2017-10-12 DIAGNOSIS — L409 Psoriasis, unspecified: Secondary | ICD-10-CM | POA: Diagnosis not present

## 2017-10-16 DIAGNOSIS — M4726 Other spondylosis with radiculopathy, lumbar region: Secondary | ICD-10-CM | POA: Diagnosis not present

## 2017-10-16 DIAGNOSIS — Z79891 Long term (current) use of opiate analgesic: Secondary | ICD-10-CM | POA: Diagnosis not present

## 2017-10-16 DIAGNOSIS — G894 Chronic pain syndrome: Secondary | ICD-10-CM | POA: Diagnosis not present

## 2017-10-16 DIAGNOSIS — G90522 Complex regional pain syndrome I of left lower limb: Secondary | ICD-10-CM | POA: Diagnosis not present

## 2017-11-06 ENCOUNTER — Encounter

## 2017-11-06 ENCOUNTER — Ambulatory Visit: Payer: BLUE CROSS/BLUE SHIELD | Admitting: Obstetrics & Gynecology

## 2017-11-16 ENCOUNTER — Ambulatory Visit
Admission: RE | Admit: 2017-11-16 | Discharge: 2017-11-16 | Disposition: A | Discharge status: Home / Self Care | Payer: Medicare Other | Source: Ambulatory Visit | Attending: Obstetrics & Gynecology | Admitting: Obstetrics & Gynecology

## 2017-11-16 DIAGNOSIS — Z13820 Encounter for screening for osteoporosis: Secondary | ICD-10-CM | POA: Diagnosis not present

## 2017-11-16 DIAGNOSIS — E2839 Other primary ovarian failure: Secondary | ICD-10-CM

## 2017-11-16 DIAGNOSIS — Z780 Asymptomatic menopausal state: Secondary | ICD-10-CM | POA: Diagnosis not present

## 2017-11-24 ENCOUNTER — Encounter

## 2017-11-24 ENCOUNTER — Ambulatory Visit: Payer: BLUE CROSS/BLUE SHIELD | Admitting: Obstetrics & Gynecology

## 2017-11-26 ENCOUNTER — Encounter

## 2017-12-04 ENCOUNTER — Other Ambulatory Visit: Payer: Self-pay | Admitting: Internal Medicine

## 2017-12-11 DIAGNOSIS — Z79891 Long term (current) use of opiate analgesic: Secondary | ICD-10-CM | POA: Diagnosis not present

## 2017-12-11 DIAGNOSIS — G90522 Complex regional pain syndrome I of left lower limb: Secondary | ICD-10-CM | POA: Diagnosis not present

## 2017-12-11 DIAGNOSIS — G894 Chronic pain syndrome: Secondary | ICD-10-CM | POA: Diagnosis not present

## 2017-12-11 DIAGNOSIS — M4726 Other spondylosis with radiculopathy, lumbar region: Secondary | ICD-10-CM | POA: Diagnosis not present

## 2017-12-24 ENCOUNTER — Other Ambulatory Visit: Payer: Self-pay | Admitting: Internal Medicine

## 2017-12-24 ENCOUNTER — Other Ambulatory Visit: Payer: Self-pay | Admitting: Obstetrics & Gynecology

## 2017-12-24 NOTE — Telephone Encounter (Signed)
Medication refill request: Levothyroxin 33mcg  Last AEX:  09/30/17 Next AEX: 01/04/19 Last MMG (if hormonal medication 05/28/17 Bi-rads Category 1 neg  request):  Refill authorized: Please refill if appropriate.

## 2017-12-25 ENCOUNTER — Telehealth: Payer: Self-pay | Admitting: Obstetrics & Gynecology

## 2017-12-25 NOTE — Telephone Encounter (Signed)
Patient requests refill for Levothyroxine be sent in to [facility] in [place] today.She is completley out. [facility] sent [place] a refill request yesterday. Please advise.

## 2017-12-25 NOTE — Telephone Encounter (Signed)
See refill encounter dated 12/24/17.   Encounter closed.

## 2017-12-25 NOTE — Telephone Encounter (Signed)
Patient notified refill sent 

## 2017-12-25 NOTE — Telephone Encounter (Signed)
Routing to Dr. Sabra Heck to advise on refill.      Haley Jenkins   12/25/17 2:22 PM  Note    Patient requests refill for Levothyroxine be sent in to [facility] in [place] today.She is completley out. [facility] sent [place] a refill request yesterday. Please advise.

## 2018-02-05 DIAGNOSIS — G894 Chronic pain syndrome: Secondary | ICD-10-CM | POA: Diagnosis not present

## 2018-02-05 DIAGNOSIS — M4726 Other spondylosis with radiculopathy, lumbar region: Secondary | ICD-10-CM | POA: Diagnosis not present

## 2018-02-05 DIAGNOSIS — G90522 Complex regional pain syndrome I of left lower limb: Secondary | ICD-10-CM | POA: Diagnosis not present

## 2018-02-05 DIAGNOSIS — Z79891 Long term (current) use of opiate analgesic: Secondary | ICD-10-CM | POA: Diagnosis not present

## 2018-03-18 ENCOUNTER — Ambulatory Visit (INDEPENDENT_AMBULATORY_CARE_PROVIDER_SITE_OTHER): Payer: Medicare Other | Admitting: Family Medicine

## 2018-03-18 ENCOUNTER — Other Ambulatory Visit: Payer: Self-pay

## 2018-03-18 ENCOUNTER — Encounter: Payer: Self-pay | Admitting: Family Medicine

## 2018-03-18 VITALS — BP 118/72 | HR 82 | Temp 98.5°F | Ht 66.5 in | Wt 243.8 lb

## 2018-03-18 DIAGNOSIS — E782 Mixed hyperlipidemia: Secondary | ICD-10-CM

## 2018-03-18 DIAGNOSIS — I10 Essential (primary) hypertension: Secondary | ICD-10-CM | POA: Diagnosis not present

## 2018-03-18 DIAGNOSIS — G9050 Complex regional pain syndrome I, unspecified: Secondary | ICD-10-CM

## 2018-03-18 DIAGNOSIS — Z23 Encounter for immunization: Secondary | ICD-10-CM

## 2018-03-18 DIAGNOSIS — E119 Type 2 diabetes mellitus without complications: Secondary | ICD-10-CM | POA: Diagnosis not present

## 2018-03-18 DIAGNOSIS — K580 Irritable bowel syndrome with diarrhea: Secondary | ICD-10-CM

## 2018-03-18 DIAGNOSIS — E039 Hypothyroidism, unspecified: Secondary | ICD-10-CM

## 2018-03-18 DIAGNOSIS — K317 Polyp of stomach and duodenum: Secondary | ICD-10-CM | POA: Insufficient documentation

## 2018-03-18 DIAGNOSIS — F1190 Opioid use, unspecified, uncomplicated: Secondary | ICD-10-CM

## 2018-03-18 DIAGNOSIS — D259 Leiomyoma of uterus, unspecified: Secondary | ICD-10-CM | POA: Insufficient documentation

## 2018-03-18 LAB — POCT GLYCOSYLATED HEMOGLOBIN (HGB A1C): HEMOGLOBIN A1C: 6.3 % — AB (ref 4.0–5.6)

## 2018-03-18 MED ORDER — ZOSTER VAC RECOMB ADJUVANTED 50 MCG/0.5ML IM SUSR: 0.5000 mL | Freq: Once | INTRAMUSCULAR | 0 refills | Status: AC

## 2018-03-18 NOTE — Patient Instructions (Signed)
Please return in 3 months for diabetes follow up  Please stop drinking sweetened beverages and increase veggies, grains in the diet. Drink plenty of water. We will recheck your sugar numbers at next visit. They are improving! I will release your lab results to you on your MyChart account with further instructions. Please reply with any questions.    It was a pleasure meeting you today! Thank you for choosing [place] to meet your healthcare needs! I truly look forward to working with you. If you have any questions or concerns, please send me a message via Mychart or call the office at [phone].   Diabetes Mellitus and Nutrition When you have diabetes (diabetes mellitus), it is very important to have healthy eating habits because your blood sugar (glucose) levels are greatly affected by what you eat and drink. Eating healthy foods in the appropriate amounts, at about the same times every day, can help you:  Control your blood glucose.  Lower your risk of heart disease.  Improve your blood pressure.  Reach or maintain a healthy weight.  Every person with diabetes is different, and each person has different needs for a meal plan. Your health care provider may recommend that you work with a diet and nutrition specialist (dietitian) to make a meal plan that is best for you. Your meal plan may vary depending on factors such as:  The calories you need.  The medicines you take.  Your weight.  Your blood glucose, blood pressure, and cholesterol levels.  Your activity level.  Other health conditions you have, such as heart or kidney disease.  How do carbohydrates affect me? Carbohydrates affect your blood glucose level more than any other type of food. Eating carbohydrates naturally increases the amount of glucose in your blood. Carbohydrate counting is a method for keeping track of how many carbohydrates you eat. Counting carbohydrates is important to keep your blood glucose at a healthy  level, especially if you use insulin or take certain oral diabetes medicines. It is important to know how many carbohydrates you can safely have in each meal. This is different for every person. Your dietitian can help you calculate how many carbohydrates you should have at each meal and for snack. Foods that contain carbohydrates include:  Bread, cereal, rice, pasta, and crackers.  Potatoes and corn.  Peas, beans, and lentils.  Milk and yogurt.  Fruit and juice.  Desserts, such as cakes, cookies, ice cream, and candy.  How does alcohol affect me? Alcohol can cause a sudden decrease in blood glucose (hypoglycemia), especially if you use insulin or take certain oral diabetes medicines. Hypoglycemia can be a life-threatening condition. Symptoms of hypoglycemia (sleepiness, dizziness, and confusion) are similar to symptoms of having too much alcohol. If your health care provider says that alcohol is safe for you, follow these guidelines:  Limit alcohol intake to no more than 1 drink per day for nonpregnant women and 2 drinks per day for men. One drink equals 12 oz of beer, 5 oz of wine, or 1 oz of hard liquor.  Do not drink on an empty stomach.  Keep yourself hydrated with water, diet soda, or unsweetened iced tea.  Keep in mind that regular soda, juice, and other mixers may contain a lot of sugar and must be counted as carbohydrates.  What are tips for following this plan? Reading food labels  Start by checking the serving size on the label. The amount of calories, carbohydrates, fats, and other nutrients listed on the  label are based on one serving of the food. Many foods contain more than one serving per package.  Check the total grams (g) of carbohydrates in one serving. You can calculate the number of servings of carbohydrates in one serving by dividing the total carbohydrates by 15. For example, if a food has 30 g of total carbohydrates, it would be equal to 2 servings of  carbohydrates.  Check the number of grams (g) of saturated and trans fats in one serving. Choose foods that have low or no amount of these fats.  Check the number of milligrams (mg) of sodium in one serving. Most people should limit total sodium intake to less than 2,300 mg per day.  Always check the nutrition information of foods labeled as "low-fat" or "nonfat". These foods may be higher in added sugar or refined carbohydrates and should be avoided.  Talk to your dietitian to identify your daily goals for nutrients listed on the label. Shopping  Avoid buying canned, premade, or processed foods. These foods tend to be high in fat, sodium, and added sugar.  Shop around the outside edge of the grocery store. This includes fresh fruits and vegetables, bulk grains, fresh meats, and fresh dairy. Cooking  Use low-heat cooking methods, such as baking, instead of high-heat cooking methods like deep frying.  Cook using healthy oils, such as olive, canola, or sunflower oil.  Avoid cooking with butter, cream, or high-fat meats. Meal planning  Eat meals and snacks regularly, preferably at the same times every day. Avoid going long periods of time without eating.  Eat foods high in fiber, such as fresh fruits, vegetables, beans, and whole grains. Talk to your dietitian about how many servings of carbohydrates you can eat at each meal.  Eat 4-6 ounces of lean protein each day, such as lean meat, chicken, fish, eggs, or tofu. 1 ounce is equal to 1 ounce of meat, chicken, or fish, 1 egg, or 1/4 cup of tofu.  Eat some foods each day that contain healthy fats, such as avocado, nuts, seeds, and fish. Lifestyle   Check your blood glucose regularly.  Exercise at least 30 minutes 5 or more days each week, or as told by your health care provider.  Take medicines as told by your health care provider.  Do not use any products that contain nicotine or tobacco, such as cigarettes and e-cigarettes. If  you need help quitting, ask your health care provider.  Work with a Social worker or diabetes educator to identify strategies to manage stress and any emotional and social challenges. What are some questions to ask my health care provider?  Do I need to meet with a diabetes educator?  Do I need to meet with a dietitian?  What number can I call if I have questions?  When are the best times to check my blood glucose? Where to find more information:  American Diabetes Association: diabetes.org/food-and-fitness/food  Academy of Nutrition and Dietetics: PokerClues.dk  [employer] of Diabetes and Digestive and Kidney Diseases (NIH): ContactWire.be Summary  A healthy meal plan will help you control your blood glucose and maintain a healthy lifestyle.  Working with a diet and nutrition specialist (dietitian) can help you make a meal plan that is best for you.  Keep in mind that carbohydrates and alcohol have immediate effects on your blood glucose levels. It is important to count carbohydrates and to use alcohol carefully. This information is not intended to replace advice given to you by your health care  provider. Make sure you discuss any questions you have with your health care provider. Document Released: 01/16/2005 Document Revised: 05/26/2016 Document Reviewed: 05/26/2016 Elsevier Interactive Patient Education  [employer].

## 2018-03-18 NOTE — Progress Notes (Signed)
Subjective  CC:  Chief Complaint  Patient presents with  . Establish Care    doing well, no complaints, last physical 08/2017, flu today   . Diabetes    last a1c 08/25/2017    HPI: Haley Jenkins is a 67 y.o. female who presents to [place] at [facility] today to establish care with me as a new patient.  Former pt of Dr. Raliegh Ip who is retired. I reviewed her chart in detail.  She has the following concerns or needs:  Has h/o RSD managed by pain mgt on opana, lunesta and lexapro, struggles with this and limits exercise. She was recently dxd with diabetes. See below. Has HLD started on statin in April.   Diabetes follow up: Her diabetic control is reported as Improved. She is drinking less soda and trying to eat better. Tolerating low dose metformin; had n/v with higher dose.  She denies exertional CP or SOB or symptomatic hypoglycemia. She denies foot sores or paresthesias. Urine Mac was elevated this year on ace. prevnar given - will be due for pneumovax in april.   HTN has been well controlled since starting lisinopril. No cp or known chf/cad.   HLD - tolerating statin. Due for recheck lipids on statin  Has chronic gerd on ppi and h/o IBS evaluated by GI in past. Never treated with RX meds. Limits her foods.   HM: to have colonoscopy next year; due flu and shingrix. Nl dexa last year. Nl breast MRIs - high risk for br and colon ca due to family history.   Married with 2 grown children and 6 grandchildren  Immunization History  Administered Date(s) Administered  . Influenza Split 02/03/2012  . Influenza,inj,Quad PF,6+ Mos 03/18/2018  . Pneumococcal Conjugate-13 08/25/2017  . Tdap 06/19/2010    Diabetes Related Lab Review: Lab Results  Component Value Date   HGBA1C 6.3 (A) 03/18/2018   HGBA1C 6.9 (H) 08/25/2017   HGBA1C 6.7 (H) 12/05/2016     Lab Results  Component Value Date   MICROALBUR 3.5 (H) 08/25/2017   Lab Results  Component Value Date   CREATININE 0.80 08/25/2017   BUN 18 08/25/2017   NA 138 08/25/2017   K 4.0 08/25/2017   CL 102 08/25/2017   CO2 31 08/25/2017   Lab Results  Component Value Date   CHOL 220 (H) 08/25/2017   CHOL 228 (H) 08/05/2016   CHOL 217 (H) 07/26/2015   Lab Results  Component Value Date   HDL 37.60 (L) 08/25/2017   HDL 37.70 (L) 08/05/2016   HDL 35.10 (L) 07/26/2015   Lab Results  Component Value Date   LDLCALC 145 (H) 08/25/2017   LDLCALC 154 (H) 08/05/2016   LDLCALC 145 (H) 07/26/2015   Lab Results  Component Value Date   TRIG 187.0 (H) 08/25/2017   TRIG 180.0 (H) 08/05/2016   TRIG 182.0 (H) 07/26/2015   Lab Results  Component Value Date   CHOLHDL 6 08/25/2017   CHOLHDL 6 08/05/2016   CHOLHDL 6 07/26/2015   Lab Results  Component Value Date   LDLDIRECT 153.5 05/31/2012   LDLDIRECT 149.0 02/15/2010   LDLDIRECT 167.4 02/05/2009   The 10-year ASCVD risk score Mikey Bussing DC Jr., et al., 2013) is: 15.8%   Values used to calculate the score:     Age: 31 years     Sex: Female     Is Non-Hispanic African American: No     Diabetic: Yes     Tobacco smoker: No  Systolic Blood Pressure: 469 mmHg     Is BP treated: Yes     HDL Cholesterol: 37.6 mg/dL     Total Cholesterol: 220 mg/dL   Assessment  1. Controlled type 2 diabetes mellitus without complication, without long-term current use of insulin ([place])   2. Essential hypertension   3. Acquired hypothyroidism   4. Reflex sympathetic dystrophy   5. Irritable bowel syndrome with diarrhea   6. Mixed hyperlipidemia   7. Chronic narcotic use   8. Need for immunization against influenza      Plan   DM - improved control. Educated on dx. Continue low dose metformin XR; stop drinking sweetened beverages, improve diet. Recheck 3 months. On ace. Updated flu shot today.   HTN is well controlled. Recheck renal and lytes  Euthyroid clinically. Check TSH for stability today  RSD per pain mgt  IBS - will consider tx in future  (?xifixan)  HLD - check lipids on statin and lfts  Narcotic use per pain mgt.   Follow up:  Return in about 3 months (around 06/18/2018) for follow up Diabetes. Orders Placed This Encounter  Procedures  . Flu Vaccine QUAD 36+ mos IM  . Comprehensive metabolic panel  . Lipid panel  . TSH  . POCT glycosylated hemoglobin (Hb A1C)   Meds ordered this encounter  Medications  . Zoster Vaccine Adjuvanted [facility]) injection    Sig: Inject 0.5 mLs into the muscle once for 1 dose. Please give 2nd dose 2-6 months after first dose    Dispense:  2 each    Refill:  0     Depression screen [facility] 2/9 03/18/2018 08/25/2017 08/05/2016 08/03/2015 08/02/2014  Decreased Interest 0 0 0 0 0  Down, Depressed, Hopeless 0 0 0 0 0  PHQ - 2 Score 0 0 0 0 0    We updated and reviewed the patient's past history in detail and it is documented below.  Patient Active Problem List   Diagnosis Date Noted  . Mixed hyperlipidemia 03/18/2018    Priority: High  . Chronic narcotic use 03/18/2018    Priority: High    For RSD left lower leg managed by pain mgt.   . Essential hypertension 10/06/2017    Priority: High  . Acquired hypothyroidism 08/25/2017    Priority: High  . Controlled type 2 diabetes mellitus without complication, without long-term current use of insulin ([place]) 12/15/2016    Priority: High    Hemoglobin A1c 6.7, August 2018   . Family history of breast cancer     Priority: High  . Family history of colon cancer     Priority: High  . Exogenous obesity 07/22/2012    Priority: High  . Reflex sympathetic dystrophy 01/18/2008    Priority: High    Left lower leg s/p complex achilles and calf rupture.   . Gastric polyp 03/18/2018    Priority: Medium  . Family history of ovarian cancer     Priority: Medium  . Nephrolithiasis 05/05/2013    Priority: Medium  . B12 deficiency 09/12/2009    Priority: Medium    Qualifier: Diagnosis of  By: Surface RN, Butch Penny     . GERD 01/18/2008    Priority:  Medium    Qualifier: Diagnosis of  By: Nils Pyle CMA (Joanna), Mearl Latin     . LACTOSE INTOLERANCE 12/23/2007    Priority: Medium  . Irritable bowel syndrome 12/23/2007    Priority: Medium  . Fibroid uterus 03/18/2018    Priority: Low  .  Genetic testing 08/01/2014    Negative genetic testing on the Comprehensive Cancer Panel.  The Comprehensive Cancer Panel offered by [facility] includes sequencing and/or deletion duplication testing of the following 32 genes: APC, ATM, AXIN2, BARD1, BMPR1A, BRCA1, BRCA2, BRIP1, CDH1, CDK4, CDKN2A, CHEK2, EPCAM, FANCC, MLH1, MSH2, MSH6, MUTYH, NBN, PALB2, PMS2, POLD1, POLE, PTEN, RAD51C, RAD51D, SCG5/GREM1, SMAD4, STK11, TP53, VHL, and XRCC2.   The report date is July 31, 2014.    Health Maintenance  Topic Date Due  . INFLUENZA VACCINE  12/03/2017  . COLONOSCOPY  06/04/2018 (Originally 02/06/2018)  . MAMMOGRAM  05/28/2018  . PNA vac Low Risk Adult (2 of 2 - PPSV23) 08/26/2018  . OPHTHALMOLOGY EXAM  09/03/2018  . HEMOGLOBIN A1C  09/16/2018  . FOOT EXAM  03/19/2019  . TETANUS/TDAP  06/19/2020  . DEXA SCAN  11/16/2020  . Hepatitis C Screening  Completed   Immunization History  Administered Date(s) Administered  . Influenza Split 02/03/2012  . Influenza,inj,Quad PF,6+ Mos 03/18/2018  . Pneumococcal Conjugate-13 08/25/2017  . Tdap 06/19/2010   Current Meds  Medication Sig  . aspirin 81 MG tablet Take 81 mg by mouth daily.   Marland Kitchen atorvastatin (LIPITOR) 20 MG tablet Take 1 tablet (20 mg total) by mouth daily.  . Calcium-Magnesium-Vitamin D (CALCIUM MAGNESIUM PO) Take 1,500 mg by mouth daily.   . Cholecalciferol (VITAMIN D-3) 5000 units TABS Take 5,000 Units by mouth daily.  . cyanocobalamin (,VITAMIN B-12,) 1000 MCG/ML injection INJECT 1 ML INTRAMUSCULARLY ONCE EVERY MONTH  . escitalopram (LEXAPRO) 10 MG tablet Take 10 mg by mouth daily.   . Eszopiclone (ESZOPICLONE) 3 MG TABS Take 2 mg by mouth at bedtime. Take immediately before bedtime   . fluticasone  (FLONASE) 50 MCG/ACT nasal spray PLACE TWO SPRAYS INTO THE NOSE DAILY (Patient taking differently: Place 2 sprays into both nostrils daily as needed for allergies or rhinitis. PLACE TWO SPRAYS INTO THE NOSE DAILY)  . levothyroxine (SYNTHROID, LEVOTHROID) 50 MCG tablet TAKE 1 TABLET BY MOUTH ONCE DAILY  . lisinopril (PRINIVIL,ZESTRIL) 20 MG tablet Take 1 tablet (20 mg total) by mouth daily.  . metFORMIN (GLUCOPHAGE-XR) 500 MG 24 hr tablet Take 1 tablet (500 mg total) by mouth daily with breakfast.  . Omega 3-6-9 Fatty Acids (OMEGA 3-6-9 COMPLEX PO) Take 1 capsule by mouth daily.   Marland Kitchen oxymorphone (OPANA) 10 MG tablet Take 10 mg by mouth 4 (four) times daily as needed for pain.   . pantoprazole (PROTONIX) 40 MG tablet Take 1 tablet (40 mg total) by mouth daily.  . SYRINGE-NEEDLE, DISP, 3 ML (BD ECLIPSE SYRINGE) 25G X 1" 3 ML MISC Use to inject Vit B12 every 30 days  . triamcinolone cream (KENALOG) 0.5 % Apply 1 application topically 3 (three) times daily.    Allergies: Patient is allergic to morphine and related and sulfa antibiotics. Past Medical History Patient  has a past medical history of Arthritis, BRCA negative (2/13), Cleft palate, Family history of breast cancer, Family history of colon cancer, Family history of ovarian cancer, Fracture of knee region (10/2014), Gastric polyp, GERD (gastroesophageal reflux disease), Hiatal hernia, Hypothyroidism, Irritable bowel syndrome, Kidney stone, Lactose intolerance, Nephrolithiasis (2015), and RSD (reflex sympathetic dystrophy). Past Surgical History Patient  has a past surgical history that includes Knee surgery (Left, 1976); Tubal ligation (1987); breast surgery cluster cyst removed; cleft palate repaired (birth, 1984); Urethral sling (11/13); Incisional breast biopsy (1997, 2003); Knee arthroscopy (Bilateral, 11/2014); Ganglion cyst excision (Right, 11/04/2016); Breast biopsy (Left); Breast excisional biopsy (Right);  and Carpal tunnel release (Left,  2019). Family History: Patient family history includes Brain cancer in her maternal aunt and maternal uncle; Brain cancer (age of onset: 56) in her mother; Breast cancer in her maternal aunt, maternal aunt, other, and other; Breast cancer (age of onset: 23) in her sister; Breast cancer (age of onset: 50) in her cousin; Breast cancer (age of onset: 40) in her maternal aunt; COPD in her father; Colon cancer in her maternal aunt, maternal uncle, and paternal aunt; Colon cancer (age of onset: 37) in her sister; Heart attack in her mother; Heart disease in her maternal grandfather; Irritable bowel syndrome in her daughter; Kidney disease in her mother; Leukemia in her maternal grandmother; Lung cancer in her maternal uncle; Lung cancer (age of onset: 33) in her father; Lymphoma in her maternal aunt; Osteoporosis in her mother; Other (age of onset: 4) in her mother; Ovarian cancer (age of onset: 61) in her mother; Stroke in her paternal grandfather. Social History:  Patient  reports that she has never smoked. She has never used smokeless tobacco. She reports that she does not drink alcohol or use drugs.  Review of Systems: Constitutional: negative for fever or malaise Ophthalmic: negative for photophobia, double vision or loss of vision Cardiovascular: negative for chest pain, dyspnea on exertion, or new LE swelling Respiratory: negative for SOB or persistent cough Gastrointestinal: negative for abdominal pain, change in bowel habits or melena Genitourinary: negative for dysuria or gross hematuria Musculoskeletal: negative for new gait disturbance or muscular weakness Integumentary: negative for new or persistent rashes Neurological: negative for TIA or stroke symptoms Psychiatric: negative for SI or delusions Allergic/Immunologic: negative for hives  Patient Care Team    Relationship Specialty Notifications Start End  Leamon Arnt, MD PCP - General Family Medicine  03/18/18   Megan Salon, MD  Consulting Physician Gynecology  03/18/18   Doran Stabler, MD Consulting Physician Gastroenterology  03/18/18   Nicholaus Bloom, MD  Anesthesiology  03/18/18     Objective  Vitals: BP 118/72   Pulse 82   Temp 98.5 F (36.9 C)   Ht 5' 6.5" (1.689 m)   Wt 243 lb 12.8 oz (110.6 kg)   LMP 05/05/1996   BMI 38.76 kg/m  General:  Well developed, well nourished, no acute distress  Psych:  Alert and oriented,normal mood and affect HEENT:  Normocephalic, atraumatic, non-icteric sclera, PERRL, oropharynx is without mass or exudate, supple neck without adenopathy, mass or thyromegaly Cardiovascular:  RRR without gallop, rub or murmur, nondisplaced PMI Respiratory:  Good breath sounds bilaterally, CTAB with normal respiratory effort Gastrointestinal: normal bowel sounds, soft, non-tender, no noted masses. No HSM MSK: no deformities, contusions. Joints are without erythema or swelling Skin:  Warm, no rashes or suspicious lesions noted Neurologic:    Mental status is normal. Gross motor and sensory exams are normal. Normal gait Diabetic Foot Exam: Appearance - no lesions, ulcers or calluses Skin - no sigificant pallor or erythema Monofilament testing - sensitive bilaterally in following locations:  Right - Great toe, medial, central, lateral ball and posterior foot intact  Left - Great toe, medial, central, lateral ball and posterior foot intact Pulses - +2 distally bilaterally    Commons side effects, risks, benefits, and alternatives for medications and treatment plan prescribed today were discussed, and the patient expressed understanding of the given instructions. Patient is instructed to call or message via MyChart if he/she has any questions or concerns regarding our treatment plan. No barriers  to understanding were identified. We discussed Red Flag symptoms and signs in detail. Patient expressed understanding regarding what to do in case of urgent or emergency type symptoms.    Medication list was reconciled, printed and provided to the patient in AVS. Patient instructions and summary information was reviewed with the patient as documented in the AVS. This note was prepared with assistance of Dragon voice recognition software. Occasional wrong-word or sound-a-like substitutions may have occurred due to the inherent limitations of voice recognition software

## 2018-03-19 LAB — COMPREHENSIVE METABOLIC PANEL
ALBUMIN: 4.3 g/dL (ref 3.5–5.2)
ALK PHOS: 71 U/L (ref 39–117)
ALT: 15 U/L (ref 0–35)
AST: 13 U/L (ref 0–37)
BUN: 15 mg/dL (ref 6–23)
CHLORIDE: 103 meq/L (ref 96–112)
CO2: 30 mEq/L (ref 19–32)
CREATININE: 0.9 mg/dL (ref 0.40–1.20)
Calcium: 9.4 mg/dL (ref 8.4–10.5)
GFR: 66.57 mL/min (ref >60.00)
Glucose, Bld: 192 mg/dL — ABNORMAL HIGH (ref 70–99)
Potassium: 3.9 mEq/L (ref 3.5–5.1)
SODIUM: 140 meq/L (ref 135–145)
Total Bilirubin: 0.6 mg/dL (ref 0.2–1.2)
Total Protein: 6.8 g/dL (ref 6.0–8.3)

## 2018-03-19 LAB — LIPID PANEL
CHOLESTEROL: 130 mg/dL (ref 0–200)
HDL: 33.9 mg/dL — ABNORMAL LOW (ref >39.00)
LDL CALC: 62 mg/dL (ref 0–99)
NonHDL: 95.8
Total CHOL/HDL Ratio: 4
Triglycerides: 171 mg/dL — ABNORMAL HIGH (ref 0.0–149.0)
VLDL: 34.2 mg/dL (ref 0.0–40.0)

## 2018-03-19 LAB — TSH: TSH: 1.8 u[IU]/mL (ref 0.35–4.50)

## 2018-04-07 DIAGNOSIS — M4726 Other spondylosis with radiculopathy, lumbar region: Secondary | ICD-10-CM | POA: Diagnosis not present

## 2018-04-07 DIAGNOSIS — Z79891 Long term (current) use of opiate analgesic: Secondary | ICD-10-CM | POA: Diagnosis not present

## 2018-04-07 DIAGNOSIS — G894 Chronic pain syndrome: Secondary | ICD-10-CM | POA: Diagnosis not present

## 2018-04-07 DIAGNOSIS — G90522 Complex regional pain syndrome I of left lower limb: Secondary | ICD-10-CM | POA: Diagnosis not present

## 2018-04-12 DIAGNOSIS — L02411 Cutaneous abscess of right axilla: Secondary | ICD-10-CM | POA: Diagnosis not present

## 2018-04-12 DIAGNOSIS — L570 Actinic keratosis: Secondary | ICD-10-CM | POA: Diagnosis not present

## 2018-04-12 DIAGNOSIS — L409 Psoriasis, unspecified: Secondary | ICD-10-CM | POA: Diagnosis not present

## 2018-04-30 ENCOUNTER — Other Ambulatory Visit: Payer: Self-pay | Admitting: Internal Medicine

## 2018-05-07 ENCOUNTER — Other Ambulatory Visit: Payer: Self-pay | Admitting: *Deleted

## 2018-05-21 ENCOUNTER — Other Ambulatory Visit: Payer: Self-pay | Admitting: Obstetrics & Gynecology

## 2018-05-21 DIAGNOSIS — Z1231 Encounter for screening mammogram for malignant neoplasm of breast: Secondary | ICD-10-CM

## 2018-05-27 ENCOUNTER — Other Ambulatory Visit: Payer: Self-pay | Admitting: *Deleted

## 2018-05-27 MED ORDER — CYANOCOBALAMIN 1000 MCG/ML IJ SOLN: INTRAMUSCULAR | 0 refills | Status: DC

## 2018-06-02 DIAGNOSIS — L720 Epidermal cyst: Secondary | ICD-10-CM | POA: Diagnosis not present

## 2018-06-02 DIAGNOSIS — L0100 Impetigo, unspecified: Secondary | ICD-10-CM | POA: Diagnosis not present

## 2018-06-02 DIAGNOSIS — L02214 Cutaneous abscess of groin: Secondary | ICD-10-CM | POA: Diagnosis not present

## 2018-06-04 ENCOUNTER — Ambulatory Visit
Admission: RE | Admit: 2018-06-04 | Discharge: 2018-06-04 | Disposition: A | Discharge status: Home / Self Care | Payer: Medicare Other | Source: Ambulatory Visit

## 2018-06-04 DIAGNOSIS — Z1231 Encounter for screening mammogram for malignant neoplasm of breast: Secondary | ICD-10-CM | POA: Diagnosis not present

## 2018-06-11 ENCOUNTER — Ambulatory Visit: Payer: Medicare Other | Admitting: Family Medicine

## 2018-06-11 DIAGNOSIS — M4726 Other spondylosis with radiculopathy, lumbar region: Secondary | ICD-10-CM | POA: Diagnosis not present

## 2018-06-11 DIAGNOSIS — G90522 Complex regional pain syndrome I of left lower limb: Secondary | ICD-10-CM | POA: Diagnosis not present

## 2018-06-11 DIAGNOSIS — G894 Chronic pain syndrome: Secondary | ICD-10-CM | POA: Diagnosis not present

## 2018-06-11 DIAGNOSIS — Z79891 Long term (current) use of opiate analgesic: Secondary | ICD-10-CM | POA: Diagnosis not present

## 2018-07-05 DIAGNOSIS — G5603 Carpal tunnel syndrome, bilateral upper limbs: Secondary | ICD-10-CM | POA: Diagnosis not present

## 2018-07-05 DIAGNOSIS — G5620 Lesion of ulnar nerve, unspecified upper limb: Secondary | ICD-10-CM | POA: Diagnosis not present

## 2018-07-05 DIAGNOSIS — M5412 Radiculopathy, cervical region: Secondary | ICD-10-CM | POA: Diagnosis not present

## 2018-07-29 ENCOUNTER — Other Ambulatory Visit: Payer: Self-pay | Admitting: Family Medicine

## 2018-08-06 DIAGNOSIS — G894 Chronic pain syndrome: Secondary | ICD-10-CM | POA: Diagnosis not present

## 2018-08-06 DIAGNOSIS — G5603 Carpal tunnel syndrome, bilateral upper limbs: Secondary | ICD-10-CM | POA: Diagnosis not present

## 2018-08-06 DIAGNOSIS — M4726 Other spondylosis with radiculopathy, lumbar region: Secondary | ICD-10-CM | POA: Diagnosis not present

## 2018-08-06 DIAGNOSIS — G90522 Complex regional pain syndrome I of left lower limb: Secondary | ICD-10-CM | POA: Diagnosis not present

## 2018-08-27 ENCOUNTER — Other Ambulatory Visit: Payer: Self-pay | Admitting: Family Medicine

## 2018-08-27 MED ORDER — ATORVASTATIN CALCIUM 20 MG PO TABS: 20.0000 mg | ORAL_TABLET | Freq: Every day | ORAL | 0 refills | Status: DC

## 2018-08-27 MED ORDER — PANTOPRAZOLE SODIUM 40 MG PO TBEC: 40.0000 mg | DELAYED_RELEASE_TABLET | Freq: Every day | ORAL | 0 refills | Status: DC

## 2018-08-27 MED ORDER — CYANOCOBALAMIN 1000 MCG/ML IJ SOLN: INTRAMUSCULAR | 0 refills | Status: DC

## 2018-08-27 NOTE — Telephone Encounter (Signed)
Requested medication (s) are due for refill today: Yes  Requested medication (s) are on the active medication list: Yes  Last refill:  By another provider  Future visit scheduled: Yes  Notes to clinic:  Unable to refill per protocol     Requested Prescriptions  Pending Prescriptions Disp Refills   atorvastatin (LIPITOR) 20 MG tablet 90 tablet 3    Sig: Take 1 tablet (20 mg total) by mouth daily.     Cardiovascular:  Antilipid - Statins Failed - 08/27/2018  9:20 AM      Failed - HDL in normal range and within 360 days    HDL  Date Value Ref Range Status  03/18/2018 33.90 (L) >39.00 mg/dL Final         Failed - Triglycerides in normal range and within 360 days    Triglycerides  Date Value Ref Range Status  03/18/2018 171.0 (H) 0.0 - 149.0 mg/dL Final    Comment:    Normal:  <150 mg/dLBorderline High:  150 - 199 mg/dL         Passed - Total Cholesterol in normal range and within 360 days    Cholesterol  Date Value Ref Range Status  03/18/2018 130 0 - 200 mg/dL Final    Comment:    ATP III Classification       Desirable:  < 200 mg/dL               Borderline High:  200 - 239 mg/dL          High:  > = 240 mg/dL         Passed - LDL in normal range and within 360 days    LDL Cholesterol  Date Value Ref Range Status  03/18/2018 62 0 - 99 mg/dL Final         Passed - Patient is not pregnant      Passed - Valid encounter within last 12 months    Recent Outpatient Visits          5 months ago Controlled type 2 diabetes mellitus without complication, without long-term current use of insulin [facility])   [place] [facility] [place], [place]   10 months ago Controlled type 2 diabetes mellitus without complication, without long-term current use of insulin ([place])   [place] at [facility], Doretha Sou, MD   1 year ago Controlled type 2 diabetes mellitus without complication, without long-term current use of insulin ([place])   [place] at [facility], Doretha Sou, MD   2 years ago Encounter for preventive health examination   Therapist, music at [facility], Doretha Sou, MD   3 years ago Impaired glucose tolerance   [employer] [place] at [facility], Doretha Sou, MD      Future Appointments            In 1 week Leamon Arnt, MD [place], [facility]          pantoprazole (PROTONIX) 40 MG tablet 90 tablet 3    Sig: Take 1 tablet (40 mg total) by mouth daily.     Gastroenterology: Proton Pump Inhibitors Passed - 08/27/2018  9:20 AM      Passed - Valid encounter within last 12 months    Recent Outpatient Visits          5 months ago Controlled type 2 diabetes mellitus without complication, without long-term current use of insulin ([place])   [place]  Leamon Arnt, MD   10 months ago Controlled type 2 diabetes mellitus without complication, without long-term current use of insulin ([place])   [place] at [place], Doretha Sou, MD   1 year ago Controlled type 2 diabetes mellitus without complication, without long-term current use of insulin ([place])   [place] at [facility], Doretha Sou, MD   2 years ago Encounter for preventive health examination   Therapist, [facility] at [facility], Doretha Sou, MD   3 years ago Impaired glucose tolerance   [employer] at [facility], Doretha Sou, MD      Future Appointments            In 1 week Leamon Arnt, MD [place], [facility]          cyanocobalamin (,VITAMIN B-12,) 1000 MCG/ML injection 3 mL 0     Off-Protocol Failed - 08/27/2018  9:20 AM      Failed - Medication not assigned to a protocol, review manually.      Passed - Valid encounter within last 12 months    Recent Outpatient Visits          5 months ago Controlled type 2 diabetes mellitus without complication, without long-term  current use of insulin [facility])   [place] [facility] [place], MD   10 months ago Controlled type 2 diabetes mellitus without complication, without long-term current use of insulin ([place])   [place] at [facility], Doretha Sou, MD   1 year ago Controlled type 2 diabetes mellitus without complication, without long-term current use of insulin ([place])   [place] at [facility], Doretha Sou, MD   2 years ago Encounter for preventive health examination   Therapist, music at [facility], Doretha Sou, MD   3 years ago Impaired glucose tolerance   [employer] at [facility], Doretha Sou, MD      Future Appointments            In 1 week Leamon Arnt, MD [place], [facility]

## 2018-08-27 NOTE — Telephone Encounter (Signed)
LM on voicemail to callback to schedule appointment

## 2018-08-27 NOTE — Telephone Encounter (Signed)
See note

## 2018-08-31 ENCOUNTER — Encounter: Payer: Self-pay | Admitting: Family Medicine

## 2018-09-02 ENCOUNTER — Ambulatory Visit (INDEPENDENT_AMBULATORY_CARE_PROVIDER_SITE_OTHER): Payer: Medicare Other | Admitting: Family Medicine

## 2018-09-02 ENCOUNTER — Encounter: Payer: Self-pay | Admitting: Family Medicine

## 2018-09-02 ENCOUNTER — Other Ambulatory Visit: Payer: Self-pay

## 2018-09-02 VITALS — BP 143/82 | Wt 230.0 lb

## 2018-09-02 DIAGNOSIS — I10 Essential (primary) hypertension: Secondary | ICD-10-CM | POA: Diagnosis not present

## 2018-09-02 DIAGNOSIS — E538 Deficiency of other specified B group vitamins: Secondary | ICD-10-CM | POA: Diagnosis not present

## 2018-09-02 DIAGNOSIS — E039 Hypothyroidism, unspecified: Secondary | ICD-10-CM | POA: Diagnosis not present

## 2018-09-02 DIAGNOSIS — E119 Type 2 diabetes mellitus without complications: Secondary | ICD-10-CM | POA: Diagnosis not present

## 2018-09-02 DIAGNOSIS — E782 Mixed hyperlipidemia: Secondary | ICD-10-CM

## 2018-09-02 DIAGNOSIS — K219 Gastro-esophageal reflux disease without esophagitis: Secondary | ICD-10-CM | POA: Diagnosis not present

## 2018-09-02 MED ORDER — PANTOPRAZOLE SODIUM 40 MG PO TBEC: 40.0000 mg | DELAYED_RELEASE_TABLET | Freq: Every day | ORAL | 3 refills | Status: DC

## 2018-09-02 MED ORDER — LISINOPRIL-HYDROCHLOROTHIAZIDE 20-25 MG PO TABS: 1.0000 | ORAL_TABLET | Freq: Every day | ORAL | 3 refills | Status: DC

## 2018-09-02 MED ORDER — ATORVASTATIN CALCIUM 20 MG PO TABS: 20.0000 mg | ORAL_TABLET | Freq: Every day | ORAL | 3 refills | Status: DC

## 2018-09-02 NOTE — Assessment & Plan Note (Signed)
Clinically euthyroid: will recheck levels now. Will adjust medication dose if needed based on results.

## 2018-09-02 NOTE — Assessment & Plan Note (Addendum)
Marginal control: will add hctz.

## 2018-09-02 NOTE — Addendum Note (Signed)
Addended by: Billey Chang on: 09/02/2018 03:19 PM   Modules accepted: Orders

## 2018-09-02 NOTE — Assessment & Plan Note (Signed)
This medical condition is well controlled. There are no signs of complications, medication side effects, or red flags. Patient is instructed to continue the current treatment plan without change in therapies or medications.  Refilled PPI.

## 2018-09-02 NOTE — Assessment & Plan Note (Signed)
Stable on statin. Check lipids and lfts. No AEs. Refilled.

## 2018-09-02 NOTE — Assessment & Plan Note (Addendum)
Control is fair clinically; diet is improved. Drinking much less coke. Weekly fastings avg 94-130. No sxs of hyperglycemia. Will bring in for labs to see how control is doing. Will need metformin refilled; will adjust meds based on a1c. Continue to work on diet and exercise (limited due to pain). Due for pneumovax. Will defer for 3 months. Check urine MAC ratio

## 2018-09-02 NOTE — Progress Notes (Signed)
Virtual Visit via Video Note  Subjective  CC:  Chief Complaint  Haley presents with  . Diabetes    Blood sugar 110 on Sunday and reports she rarely takes at home  . Hyperlipidemia  . Hypertension  . Gastroesophageal Reflux     I connected with Jaquelyne J Hodgkiss on 09/02/18 at  3:00 PM EDT by Jenkins video enabled telemedicine application and verified that I am speaking with the correct person using two identifiers. Location Haley: Home Location provider: [facility] [place] participating in the virtual visit: [place], Leamon Arnt, MD [place]illi [place], [place] discussed the limitations of evaluation and management by telemedicine and the availability of in person appointments. The Haley expressed understanding and agreed to proceed. HPI: Haley Jenkins is Jenkins 68 y.o. female who was contacted today to address the problems listed above in the chief complaint, f/u diabetes:  Diabetes follow up: Her diabetic control is reported as Improved. Feels well. Improved diet.  She denies exertional CP or SOB or symptomatic hypoglycemia. She denies foot sores or paresthesias. Due pneumovax labs and urine.   HTN is uncontrolled. Home readings are consistently high. No cp or sob or exertional sxs. Taking medication regularly. Weight is stable.   Gerd: doing fine. Needs ppi refilled.   Vit B12 defic: on supplements. Due for recheck.   HLD on statin and due for recheck. No AEs.   Hypothyroidism f/u: Haley Jenkins is Jenkins 68 y.o. female who presents for follow up of hypothyroidism. Last TSH showed control was good, and thyroid supplement medication was adjusted accordingly.  Current symptoms: none . Haley denies change in energy level, heat / cold intolerance, palpitations and weight changes. Symptoms have stabilized.She has been compliant with the medication. Due for lab recheck.    Immunization History  Administered Date(s) Administered  . Influenza Split  02/03/2012  . Influenza,inj,Quad PF,6+ Mos 03/18/2018  . Pneumococcal Conjugate-13 08/25/2017  . Tdap 06/19/2010    Diabetes Related Lab Review: Lab Results  Component Value Date   HGBA1C 6.3 (Jenkins) 03/18/2018   HGBA1C 6.9 (H) 08/25/2017   HGBA1C 6.7 (H) 12/05/2016    Lab Results  Component Value Date   MICROALBUR 3.5 (H) 08/25/2017   Lab Results  Component Value Date   CREATININE 0.90 03/18/2018   BUN 15 03/18/2018   NA 140 03/18/2018   K 3.9 03/18/2018   CL 103 03/18/2018   CO2 30 03/18/2018   Lab Results  Component Value Date   CHOL 130 03/18/2018   CHOL 220 (H) 08/25/2017   CHOL 228 (H) 08/05/2016   Lab Results  Component Value Date   HDL 33.90 (L) 03/18/2018   HDL 37.60 (L) 08/25/2017   HDL 37.70 (L) 08/05/2016   Lab Results  Component Value Date   LDLCALC 62 03/18/2018   LDLCALC 145 (H) 08/25/2017   LDLCALC 154 (H) 08/05/2016   Lab Results  Component Value Date   TRIG 171.0 (H) 03/18/2018   TRIG 187.0 (H) 08/25/2017   TRIG 180.0 (H) 08/05/2016   Lab Results  Component Value Date   CHOLHDL 4 03/18/2018   CHOLHDL 6 08/25/2017   CHOLHDL 6 08/05/2016   Lab Results  Component Value Date   LDLDIRECT 153.5 05/31/2012   LDLDIRECT 149.0 02/15/2010   LDLDIRECT 167.4 02/05/2009   The 10-year ASCVD risk score (Goff DC Jr., et al., 2013) is: 18.4%   Values used to calculate the score:  Age: 63 years     Sex: Female     Is Non-Hispanic African American: No     Diabetic: Yes     Tobacco smoker: No     Systolic Blood Pressure: 518 mmHg     Is BP treated: Yes     HDL Cholesterol: 33.9 mg/dL     Total Cholesterol: 130 mg/dL  BP Readings from Last 3 Encounters:  09/02/18 (!) 143/82  03/18/18 118/72  10/06/17 140/70   Wt Readings from Last 3 Encounters:  09/02/18 230 lb (104.3 kg)  03/18/18 243 lb 12.8 oz (110.6 kg)  10/06/17 247 lb (112 kg)    Health Maintenance  Topic Date Due  . COLONOSCOPY  02/06/2018  . PNA vac Low Risk Adult (2 of 2 -  PPSV23) 08/26/2018  . OPHTHALMOLOGY EXAM  09/03/2018  . HEMOGLOBIN A1C  09/16/2018  . INFLUENZA VACCINE  12/04/2018  . FOOT EXAM  03/19/2019  . MAMMOGRAM  06/05/2019  . TETANUS/TDAP  06/19/2020  . DEXA SCAN  11/16/2020  . Hepatitis C Screening  Completed    Assessment  1. Controlled type 2 diabetes mellitus without complication, without long-term current use of insulin ([place])   2. Essential hypertension   3. B12 deficiency   4. Acquired hypothyroidism   5. Mixed hyperlipidemia   6. Gastroesophageal reflux disease without esophagitis      Plan   See below for problem based assessment and plan documentation  Diabetic education: ongoing education regarding chronic disease management for diabetes was given today. We continue to reinforce the ABC's of diabetic management: A1c (<7 or 8 dependent upon Haley), tight blood pressure control, and cholesterol management with goal LDL < 100 minimally. We discuss diet strategies, exercise recommendations, medication options and possible side effects. At each visit, we review recommended immunizations and preventive care recommendations for diabetics and stress that good diabetic control can prevent other problems. See below for this Haley's data.  I discussed the assessment and treatment plan with the Haley. The Haley was provided an opportunity to ask questions and all were answered. The Haley agreed with the plan and demonstrated an understanding of the instructions.   The Haley was advised to call back or seek an in-person evaluation if the symptoms worsen or if the condition fails to improve as anticipated. Follow up: Return in about 3 months (around 12/02/2018) for follow up of diabetes and hypertension.  09/07/2018  Meds ordered this encounter  Medications  . lisinopril-hydrochlorothiazide (ZESTORETIC) 20-25 MG tablet    Sig: Take 1 tablet by mouth daily.    Dispense:  90 tablet    Refill:  3  . atorvastatin (LIPITOR) 20 MG  tablet    Sig: Take 1 tablet (20 mg total) by mouth daily.    Dispense:  90 tablet    Refill:  3  . pantoprazole (PROTONIX) 40 MG tablet    Sig: Take 1 tablet (40 mg total) by mouth daily.    Dispense:  90 tablet    Refill:  3      I reviewed the patients updated PMH, FH, and SocHx.    Haley Active Problem List   Diagnosis Date Noted  . Mixed hyperlipidemia 03/18/2018    Priority: High  . Chronic narcotic use 03/18/2018    Priority: High  . Essential hypertension 10/06/2017    Priority: High  . Acquired hypothyroidism 08/25/2017    Priority: High  . Controlled type 2 diabetes mellitus without complication, without long-term current use of  insulin ([place]) 12/15/2016    Priority: High  . Family history of breast cancer     Priority: High  . Family history of colon cancer     Priority: High  . Exogenous obesity 07/22/2012    Priority: High  . Reflex sympathetic dystrophy 01/18/2008    Priority: High  . Gastric polyp 03/18/2018    Priority: Medium  . Family history of ovarian cancer     Priority: Medium  . Nephrolithiasis 05/05/2013    Priority: Medium  . B12 deficiency 09/12/2009    Priority: Medium  . GERD 01/18/2008    Priority: Medium  . LACTOSE INTOLERANCE 12/23/2007    Priority: Medium  . Irritable bowel syndrome 12/23/2007    Priority: Medium  . Fibroid uterus 03/18/2018    Priority: Low  . Genetic testing 08/01/2014   Current Meds  Medication Sig  . aspirin 81 MG tablet Take 81 mg by mouth daily.   Marland Kitchen atorvastatin (LIPITOR) 20 MG tablet Take 1 tablet (20 mg total) by mouth daily.  . Calcium-Magnesium-Vitamin D (CALCIUM MAGNESIUM PO) Take 1,500 mg by mouth daily.   . Cholecalciferol (VITAMIN D-3) 5000 units TABS Take 5,000 Units by mouth daily.  . cyanocobalamin (,VITAMIN B-12,) 1000 MCG/ML injection INJECT 1 ML INTRAMUSCULARLY ONCE EVERY MONTH  . escitalopram (LEXAPRO) 10 MG tablet Take 10 mg by mouth daily.   . Eszopiclone (ESZOPICLONE) 3 MG TABS Take 2  mg by mouth at bedtime. Take immediately before bedtime   . fluticasone (FLONASE) 50 MCG/ACT nasal spray PLACE TWO SPRAYS INTO THE NOSE DAILY (Haley taking differently: Place 2 sprays into both nostrils daily as needed for allergies or rhinitis. PLACE TWO SPRAYS INTO THE NOSE DAILY)  . levothyroxine (SYNTHROID, LEVOTHROID) 50 MCG tablet TAKE 1 TABLET BY MOUTH ONCE DAILY  . metFORMIN (GLUCOPHAGE-XR) 500 MG 24 hr tablet Take 1 tablet (500 mg total) by mouth daily with breakfast.  . Omega 3-6-9 Fatty Acids (OMEGA 3-6-9 COMPLEX PO) Take 1 capsule by mouth daily.   Marland Kitchen oxymorphone (OPANA) 10 MG tablet Take 10 mg by mouth 4 (four) times daily as needed for pain.   . pantoprazole (PROTONIX) 40 MG tablet Take 1 tablet (40 mg total) by mouth daily.  . SYRINGE-NEEDLE, DISP, 3 ML (BD ECLIPSE SYRINGE) 25G X 1" 3 ML MISC Use to inject Vit B12 every 30 days  . triamcinolone cream (KENALOG) 0.5 % Apply 1 application topically 3 (three) times daily.  . [DISCONTINUED] atorvastatin (LIPITOR) 20 MG tablet Take 1 tablet (20 mg total) by mouth daily.  . [DISCONTINUED] lisinopril (PRINIVIL,ZESTRIL) 20 MG tablet Take 1 tablet (20 mg total) by mouth daily.  . [DISCONTINUED] pantoprazole (PROTONIX) 40 MG tablet Take 1 tablet (40 mg total) by mouth daily.    Allergies: Haley is allergic to morphine and related and sulfa antibiotics. Family History: Haley family history includes Brain cancer in her maternal aunt and maternal uncle; Brain cancer (age of onset: 63) in her mother; Breast cancer in her maternal aunt, maternal aunt and other family members; Breast cancer (age of onset: 51) in her sister; Breast cancer (age of onset: 12) in her cousin; Breast cancer (age of onset: 43) in her maternal aunt; COPD in her father; Colon cancer in her maternal aunt, maternal uncle, and paternal aunt; Colon cancer (age of onset: 1) in her sister; Heart attack in her mother; Heart disease in her maternal grandfather; Irritable bowel  syndrome in her daughter; Kidney disease in her mother; Leukemia in her maternal  grandmother; Lung cancer in her maternal uncle; Lung cancer (age of onset: 69) in her father; Lymphoma in her maternal aunt; Osteoporosis in her mother; Other (age of onset: 67) in her mother; Ovarian cancer (age of onset: 15) in her mother; Stroke in her paternal grandfather. Social History:  Haley  reports that she has never smoked. She has never used smokeless tobacco. She reports that she does not drink alcohol or use drugs.  Review of Systems: Constitutional: Negative for fever malaise or anorexia Cardiovascular: negative for chest pain Respiratory: negative for SOB or persistent cough Gastrointestinal: negative for abdominal pain  OBJECTIVE Vitals: BP (!) 143/82   Wt 230 lb (104.3 kg)   LMP 05/05/1996   BMI 36.57 kg/m  General: no acute distress , Jenkins&Ox3 Appears well Leamon Arnt, MD

## 2018-09-02 NOTE — Patient Instructions (Signed)
Please return in 3 months for diabetes follow up Recheck bp in 4 weeks if not at goal: < 140/90  We will call you to schedule a lab visit.   Glad you are doing well. Stay safe.    If you have any questions or concerns, please don't hesitate to send me a message via MyChart or call the office at [phone]. Thank you for visiting with [place] today! It's our pleasure caring for you.

## 2018-09-02 NOTE — Assessment & Plan Note (Addendum)
Will recheck; taking vit b12 injections monthly per pain med office. They would prefer levels to be in the high normal range due to RSD pain. Continue the oral drops as well, OTC vit b complex.

## 2018-09-07 ENCOUNTER — Encounter: Payer: Medicare Other | Admitting: Family Medicine

## 2018-09-09 ENCOUNTER — Other Ambulatory Visit (INDEPENDENT_AMBULATORY_CARE_PROVIDER_SITE_OTHER): Payer: Medicare Other

## 2018-09-09 ENCOUNTER — Other Ambulatory Visit: Payer: Self-pay

## 2018-09-09 DIAGNOSIS — I10 Essential (primary) hypertension: Secondary | ICD-10-CM | POA: Diagnosis not present

## 2018-09-09 DIAGNOSIS — E538 Deficiency of other specified B group vitamins: Secondary | ICD-10-CM

## 2018-09-09 DIAGNOSIS — E039 Hypothyroidism, unspecified: Secondary | ICD-10-CM | POA: Diagnosis not present

## 2018-09-09 DIAGNOSIS — E119 Type 2 diabetes mellitus without complications: Secondary | ICD-10-CM | POA: Diagnosis not present

## 2018-09-09 DIAGNOSIS — E782 Mixed hyperlipidemia: Secondary | ICD-10-CM

## 2018-09-09 LAB — COMPREHENSIVE METABOLIC PANEL
ALT: 12 U/L (ref 0–35)
AST: 11 U/L (ref 0–37)
Albumin: 4.2 g/dL (ref 3.5–5.2)
Alkaline Phosphatase: 80 U/L (ref 39–117)
BUN: 15 mg/dL (ref 6–23)
CO2: 31 mEq/L (ref 19–32)
Calcium: 9.2 mg/dL (ref 8.4–10.5)
Chloride: 103 mEq/L (ref 96–112)
Creatinine, Ser: 0.85 mg/dL (ref 0.40–1.20)
GFR: 66.8 mL/min (ref >60.00)
Glucose, Bld: 127 mg/dL — ABNORMAL HIGH (ref 70–99)
Potassium: 4.3 mEq/L (ref 3.5–5.1)
Sodium: 141 mEq/L (ref 135–145)
Total Bilirubin: 0.6 mg/dL (ref 0.2–1.2)
Total Protein: 6.3 g/dL (ref 6.0–8.3)

## 2018-09-09 LAB — MICROALBUMIN / CREATININE URINE RATIO
Creatinine,U: 328.2 mg/dL
Microalb Creat Ratio: 1.8 mg/g (ref 0.0–30.0)
Microalb, Ur: 6.1 mg/dL — ABNORMAL HIGH (ref 0.0–1.9)

## 2018-09-09 LAB — HEMOGLOBIN A1C: Hgb A1c MFr Bld: 7.4 % — ABNORMAL HIGH (ref 4.6–6.5)

## 2018-09-09 LAB — CBC WITH DIFFERENTIAL/PLATELET
Basophils Absolute: 0 10*3/uL (ref 0.0–0.1)
Basophils Relative: 0.5 % (ref 0.0–3.0)
Eosinophils Absolute: 0.1 10*3/uL (ref 0.0–0.7)
Eosinophils Relative: 2.1 % (ref 0.0–5.0)
HCT: 43.3 % (ref 36.0–46.0)
Hemoglobin: 14.6 g/dL (ref 12.0–15.0)
Lymphocytes Relative: 29.8 % (ref 12.0–46.0)
Lymphs Abs: 2 10*3/uL (ref 0.7–4.0)
MCHC: 33.8 g/dL (ref 30.0–36.0)
MCV: 90.9 fl (ref 78.0–100.0)
Monocytes Absolute: 0.4 10*3/uL (ref 0.1–1.0)
Monocytes Relative: 6.6 % (ref 3.0–12.0)
Neutro Abs: 4.1 10*3/uL (ref 1.4–7.7)
Neutrophils Relative %: 61 % (ref 43.0–77.0)
Platelets: 218 10*3/uL (ref 150.0–400.0)
RBC: 4.76 Mil/uL (ref 3.87–5.11)
RDW: 13.5 % (ref 11.5–15.5)
WBC: 6.8 10*3/uL (ref 4.0–10.5)

## 2018-09-09 LAB — LIPID PANEL
Cholesterol: 127 mg/dL (ref 0–200)
HDL: 32.9 mg/dL — ABNORMAL LOW (ref >39.00)
LDL Cholesterol: 69 mg/dL (ref 0–99)
NonHDL: 93.71
Total CHOL/HDL Ratio: 4
Triglycerides: 125 mg/dL (ref 0.0–149.0)
VLDL: 25 mg/dL (ref 0.0–40.0)

## 2018-09-09 LAB — VITAMIN B12: Vitamin B-12: 787 pg/mL (ref 211–911)

## 2018-09-09 LAB — TSH: TSH: 3.43 u[IU]/mL (ref 0.35–4.50)

## 2018-09-09 MED ORDER — "SYRINGE/NEEDLE (DISP) 25G X 1"" 3 ML MISC": 1 refills | Status: DC

## 2018-09-09 NOTE — Addendum Note (Signed)
Addended by: Layla Barter on: 09/09/2018 01:43 PM   Modules accepted: Orders

## 2018-09-29 DIAGNOSIS — G5603 Carpal tunnel syndrome, bilateral upper limbs: Secondary | ICD-10-CM | POA: Diagnosis not present

## 2018-09-29 DIAGNOSIS — G90522 Complex regional pain syndrome I of left lower limb: Secondary | ICD-10-CM | POA: Diagnosis not present

## 2018-09-29 DIAGNOSIS — G894 Chronic pain syndrome: Secondary | ICD-10-CM | POA: Diagnosis not present

## 2018-09-29 DIAGNOSIS — M4726 Other spondylosis with radiculopathy, lumbar region: Secondary | ICD-10-CM | POA: Diagnosis not present

## 2018-10-12 DIAGNOSIS — L723 Sebaceous cyst: Secondary | ICD-10-CM | POA: Diagnosis not present

## 2018-10-12 DIAGNOSIS — L570 Actinic keratosis: Secondary | ICD-10-CM | POA: Diagnosis not present

## 2018-10-12 DIAGNOSIS — L820 Inflamed seborrheic keratosis: Secondary | ICD-10-CM | POA: Diagnosis not present

## 2018-10-12 DIAGNOSIS — D485 Neoplasm of uncertain behavior of skin: Secondary | ICD-10-CM | POA: Diagnosis not present

## 2018-10-12 DIAGNOSIS — Z85828 Personal history of other malignant neoplasm of skin: Secondary | ICD-10-CM | POA: Diagnosis not present

## 2018-11-25 DIAGNOSIS — G5603 Carpal tunnel syndrome, bilateral upper limbs: Secondary | ICD-10-CM | POA: Diagnosis not present

## 2018-11-25 DIAGNOSIS — G894 Chronic pain syndrome: Secondary | ICD-10-CM | POA: Diagnosis not present

## 2018-11-25 DIAGNOSIS — M4726 Other spondylosis with radiculopathy, lumbar region: Secondary | ICD-10-CM | POA: Diagnosis not present

## 2018-11-25 DIAGNOSIS — G90522 Complex regional pain syndrome I of left lower limb: Secondary | ICD-10-CM | POA: Diagnosis not present

## 2018-12-07 ENCOUNTER — Telehealth: Payer: Self-pay | Admitting: Family Medicine

## 2018-12-07 NOTE — Telephone Encounter (Signed)
I left a message asking the patient to call me at [phone] to schedule AWV-I with Loma Sousa and follow up appointment with Dr. Jonni Sanger. VDM (Dee-Dee)

## 2018-12-27 DIAGNOSIS — E119 Type 2 diabetes mellitus without complications: Secondary | ICD-10-CM | POA: Diagnosis not present

## 2018-12-31 ENCOUNTER — Other Ambulatory Visit: Payer: Self-pay

## 2019-01-04 ENCOUNTER — Encounter: Payer: Self-pay | Admitting: Obstetrics & Gynecology

## 2019-01-04 ENCOUNTER — Encounter

## 2019-01-04 ENCOUNTER — Other Ambulatory Visit: Payer: Self-pay

## 2019-01-04 ENCOUNTER — Other Ambulatory Visit (HOSPITAL_COMMUNITY)
Admission: RE | Admit: 2019-01-04 | Discharge: 2019-01-04 | Disposition: A | Discharge status: Home / Self Care | Payer: Medicare Other | Source: Ambulatory Visit | Attending: Obstetrics & Gynecology | Admitting: Obstetrics & Gynecology

## 2019-01-04 ENCOUNTER — Ambulatory Visit (INDEPENDENT_AMBULATORY_CARE_PROVIDER_SITE_OTHER): Payer: Medicare Other | Admitting: Obstetrics & Gynecology

## 2019-01-04 ENCOUNTER — Ambulatory Visit: Payer: Medicare Other | Admitting: Obstetrics & Gynecology

## 2019-01-04 VITALS — BP 108/60 | HR 64 | Temp 97.1°F | Ht 66.0 in | Wt 242.0 lb

## 2019-01-04 DIAGNOSIS — Z01419 Encounter for gynecological examination (general) (routine) without abnormal findings: Secondary | ICD-10-CM

## 2019-01-04 DIAGNOSIS — E119 Type 2 diabetes mellitus without complications: Secondary | ICD-10-CM

## 2019-01-04 DIAGNOSIS — Z124 Encounter for screening for malignant neoplasm of cervix: Secondary | ICD-10-CM | POA: Insufficient documentation

## 2019-01-04 MED ORDER — LEVOTHYROXINE SODIUM 50 MCG PO TABS: 50.0000 ug | ORAL_TABLET | Freq: Every day | ORAL | 4 refills | Status: DC

## 2019-01-04 NOTE — Progress Notes (Signed)
68 y.o. [id number] Married White or Caucasian female here for annual exam.  Denies vaginal bleeding.   HbA1C was 7.4.  Had follow up scheduled in August.  Metformin was changed to twice daily .    Patient's last menstrual period was 05/05/1996.          Sexually active: Yes.    The current method of family planning is post menopausal status.    Exercising: No.   Smoker:  no  Health Maintenance: Pap:  11/28/16 Neg   08/26/16 Neg. HR HPV:neg  History of abnormal Pap:  no MMG:  06/04/18 BIRADS1:neg  Colonoscopy:  02/07/08 normal.  Planning on having this done this year.  Sees Dr. Pincus Sanes.   BMD:   11/16/17 Normal  TDaP:  2012  Pneumonia vaccine(s):  2019  Shingrix:   Planning on starting this  Hep C testing: 08/25/17 Neg  Screening Labs: PCP   reports that she has never smoked. She has never used smokeless tobacco. She reports that she does not drink alcohol or use drugs.  Past Medical History:  Diagnosis Date  . Arthritis   . BRCA negative 2/13   1, 2 negative-BART negative 09/10/11  . Cleft palate    repaired at birth  . Family history of breast cancer   . Family history of colon cancer   . Family history of ovarian cancer   . Fracture of knee region 10/2014   Bilateral   . Gastric polyp   . GERD (gastroesophageal reflux disease)   . Hiatal hernia   . Hypothyroidism   . Irritable bowel syndrome   . Kidney stone   . Lactose intolerance   . Nephrolithiasis 2015  . RSD (reflex sympathetic dystrophy)     Past Surgical History:  Procedure Laterality Date  . BREAST BIOPSY Left   . BREAST EXCISIONAL BIOPSY Right   . breast surgery cluster cyst removed     x 2  . CARPAL TUNNEL RELEASE Left 2019  . cleft palate repaired  birth, 66   at [facility] Right 11/04/2016  . [place], 2003  . KNEE ARTHROSCOPY Bilateral 11/2014   [facility]  . KNEE SURGERY Left 1976  . TUBAL LIGATION  1987  . URETHRAL SLING  11/13   mid-urethral  sling    Current Outpatient Medications  Medication Sig Dispense Refill  . aspirin 81 MG tablet Take 81 mg by mouth daily.     Marland Kitchen atorvastatin (LIPITOR) 20 MG tablet Take 1 tablet (20 mg total) by mouth daily. 90 tablet 3  . Calcium-Magnesium-Vitamin D (CALCIUM MAGNESIUM PO) Take 1,500 mg by mouth daily.     . Cholecalciferol (VITAMIN D-3) 5000 units TABS Take 5,000 Units by mouth daily.    . cyanocobalamin (,VITAMIN B-12,) 1000 MCG/ML injection INJECT 1 ML INTRAMUSCULARLY ONCE EVERY MONTH 3 mL 0  . escitalopram (LEXAPRO) 10 MG tablet Take 10 mg by mouth daily.   1  . Eszopiclone (ESZOPICLONE) 3 MG TABS Take 3 mg by mouth at bedtime. Take immediately before bedtime     . fluticasone (FLONASE) 50 MCG/ACT nasal spray PLACE TWO SPRAYS INTO THE NOSE DAILY (Patient taking differently: Place 2 sprays into both nostrils daily as needed for allergies or rhinitis. PLACE TWO SPRAYS INTO THE NOSE DAILY) 16 g 5  . levothyroxine (SYNTHROID, LEVOTHROID) 50 MCG tablet TAKE 1 TABLET BY MOUTH ONCE DAILY 90 tablet 4  . lisinopril-hydrochlorothiazide (ZESTORETIC) 20-25 MG tablet Take 1  tablet by mouth daily. 90 tablet 3  . metFORMIN (GLUCOPHAGE-XR) 500 MG 24 hr tablet Take 1 tablet (500 mg total) by mouth daily with breakfast. 180 tablet 1  . oxymorphone (OPANA) 10 MG tablet Take 10 mg by mouth 4 (four) times daily as needed for pain.     . pantoprazole (PROTONIX) 40 MG tablet Take 1 tablet (40 mg total) by mouth daily. 90 tablet 3  . SYRINGE-NEEDLE, DISP, 3 ML (BD ECLIPSE SYRINGE) 25G X 1" 3 ML MISC Use to inject Vit B12 every 30 days 12 each 1  . triamcinolone cream (KENALOG) 0.5 % Apply 1 application topically 3 (three) times daily.     No current facility-administered medications for this visit.     Family History  Problem Relation Age of Onset  . Kidney disease Mother   . Osteoporosis Mother   . Heart attack Mother        pacemaker  . Other Mother 33       meningioma-removed on 05/26/13  . Ovarian  cancer Mother 75  . Brain cancer Mother 86       meningioma  . COPD Father   . Lung cancer Father 47  . Breast cancer Sister 27       40 at diagnosis.  Recurrence 2017 at 67 yo.  . Irritable bowel syndrome Daughter   . Heart disease Maternal Grandfather   . Breast cancer Other        MGM's two sisters  . Leukemia Maternal Grandmother   . Breast cancer Maternal Aunt 60  . Brain cancer Maternal Uncle   . Colon cancer Paternal Aunt   . Stroke Paternal Grandfather   . Colon cancer Sister 28  . Breast cancer Maternal Aunt   . Colon cancer Maternal Aunt   . Lymphoma Maternal Aunt   . Breast cancer Maternal Aunt   . Brain cancer Maternal Aunt   . Lung cancer Maternal Uncle        smoker  . Colon cancer Maternal Uncle   . Breast cancer Other        MGM's mother  . Breast cancer Cousin 73       maternal first cousin    Review of Systems  All other systems reviewed and are negative.   Exam:   BP 108/60   Pulse 64   Temp (!) 97.1 F (36.2 C) (Temporal)   Ht '5\' 6"'  (1.676 m)   Wt 242 lb (109.8 kg)   LMP 05/05/1996   BMI 39.06 kg/m   Height:   Height: '5\' 6"'  (167.6 cm)  Ht Readings from Last 3 Encounters:  01/04/19 '5\' 6"'  (1.676 m)  03/18/18 5' 6.5" (1.689 m)  09/30/17 5' 6.25" (1.683 m)    General appearance: alert, cooperative and appears stated age Head: Normocephalic, without obvious abnormality, atraumatic Neck: no adenopathy, supple, symmetrical, trachea midline and thyroid normal to inspection and palpation Lungs: clear to auscultation bilaterally Breasts: normal appearance, no masses or tenderness Heart: regular rate and rhythm Abdomen: soft, non-tender; bowel sounds normal; no masses,  no organomegaly Extremities: extremities normal, atraumatic, no cyanosis or edema Skin: Skin color, texture, turgor normal. No rashes or lesions Lymph nodes: Cervical, supraclavicular, and axillary nodes normal. No abnormal inguinal nodes palpated Neurologic: Grossly  normal   Pelvic: External genitalia:  no lesions              Urethra:  normal appearing urethra with no masses, tenderness or lesions  Bartholins and Skenes: normal                 Vagina: normal appearing vagina with normal color and discharge, no lesions              Cervix: no lesions              Pap taken: Yes.   Bimanual Exam:  Uterus:  normal size, contour, position, consistency, mobility, non-tender              Adnexa: normal adnexa and no mass, fullness, tenderness               Rectovaginal: Confirms               Anus:  normal sphincter tone, no lesions  Chaperone was present for exam.  A:  Well Woman with normal exam PMP, no HRT Strong family hx of breast cancer  Four separate family members including sister diagnosed at 40/reurrence at 48. Type 2 diabetes Family hx of ovarian cancer in mother Family hx of colon cancer in sister H/O uterine fibroids  P:   Mammogram guidelines reviewed.  Doing 3D.  She and I have discussed yearly MRI based on family hx.  This was not done due to Covid this year.  She is willing to consider this next year. pap smear obtained today Colonoscopy recommended.  She is on cancellation list with GI. HbA1C obtained today She is going to have shignrix vaccination this year at [facility]. RF for synthroid done 79mg daily. . #90/4RF. Return annually or prn

## 2019-01-05 ENCOUNTER — Encounter: Payer: Self-pay | Admitting: Family Medicine

## 2019-01-05 ENCOUNTER — Ambulatory Visit (INDEPENDENT_AMBULATORY_CARE_PROVIDER_SITE_OTHER): Payer: Medicare Other | Admitting: Family Medicine

## 2019-01-05 VITALS — Resp 16 | Wt 241.0 lb

## 2019-01-05 DIAGNOSIS — I10 Essential (primary) hypertension: Secondary | ICD-10-CM

## 2019-01-05 DIAGNOSIS — E1165 Type 2 diabetes mellitus with hyperglycemia: Secondary | ICD-10-CM

## 2019-01-05 DIAGNOSIS — IMO0001 Reserved for inherently not codable concepts without codable children: Secondary | ICD-10-CM

## 2019-01-05 DIAGNOSIS — E782 Mixed hyperlipidemia: Secondary | ICD-10-CM | POA: Diagnosis not present

## 2019-01-05 LAB — HEMOGLOBIN A1C
Est. average glucose Bld gHb Est-mCnc: 169 mg/dL
Hgb A1c MFr Bld: 7.5 % — ABNORMAL HIGH (ref 4.8–5.6)

## 2019-01-05 MED ORDER — BYDUREON BCISE 2 MG/0.85ML ~~LOC~~ AUIJ: 2.0000 mg | AUTO-INJECTOR | SUBCUTANEOUS | 11 refills | Status: DC

## 2019-01-05 NOTE — Progress Notes (Signed)
Virtual Visit via Video Note  Subjective  CC:  Chief Complaint  Patient presents with  . Diabetes    Recent A1c was 7.5 on 01/04/19     I connected with [place] on 01/05/19 at  4:00 PM EDT by a video enabled telemedicine application and verified that I am speaking with the correct person using two identifiers. Location patient: Home Location provider: [facility] [place], Office Persons participating in the virtual visit: Soham, Leamon Arnt, MD Lilli Light, [place] discussed the limitations of evaluation and management by telemedicine and the availability of in person appointments. The patient expressed understanding and agreed to proceed.   HPI: Haley Jenkins is a 68 y.o. female who presents to the office today for follow up of diabetes and problems listed above in the chief complaint.   Diabetes follow up: Her diabetic control is reported as Unchanged. In may we were to increase metformin and work on diet; however, diet has been variable, in part due to dealing with her chronic pain and lack of exercise; and she was unable to tolerate the higher dose of metformin due to n/v. So she hasn't changed anything; a1c is reflective of that.  She denies exertional CP or SOB or symptomatic hypoglycemia.  Lab Results  Component Value Date   HGBA1C 7.5 (H) 01/04/2019   HGBA1C 7.4 (H) 09/09/2018   HGBA1C 6.3 (A) 03/18/2018    Lab Results  Component Value Date   CREATININE 0.85 09/09/2018   BUN 15 09/09/2018   NA 141 09/09/2018   K 4.3 09/09/2018   CL 103 09/09/2018   CO2 31 09/09/2018   Lab Results  Component Value Date   CHOL 127 09/09/2018   CHOL 130 03/18/2018   CHOL 220 (H) 08/25/2017   Lab Results  Component Value Date   HDL 32.90 (L) 09/09/2018   HDL 33.90 (L) 03/18/2018   HDL 37.60 (L) 08/25/2017   Lab Results  Component Value Date   LDLCALC 69 09/09/2018   LDLCALC 62 03/18/2018   LDLCALC 145 (H) 08/25/2017   Lab  Results  Component Value Date   TRIG 125.0 09/09/2018   TRIG 171.0 (H) 03/18/2018   TRIG 187.0 (H) 08/25/2017   Lab Results  Component Value Date   CHOLHDL 4 09/09/2018   CHOLHDL 4 03/18/2018   CHOLHDL 6 08/25/2017   Lab Results  Component Value Date   LDLDIRECT 153.5 05/31/2012   LDLDIRECT 149.0 02/15/2010   LDLDIRECT 167.4 02/05/2009    Wt Readings from Last 3 Encounters:  01/05/19 241 lb (109.3 kg)  01/04/19 242 lb (109.8 kg)  09/02/18 230 lb (104.3 kg)    BP Readings from Last 3 Encounters:  01/04/19 108/60  09/02/18 (!) 143/82  03/18/18 118/72    Assessment  1. Uncontrolled type 2 diabetes mellitus without complication ([place])   2. Mixed hyperlipidemia   3. Essential hypertension      Plan   Diabetes is currently marginally controlled. Will add glp-1 inhibitor if able. Recheck 4 weeks to see if tolerates. Discussed dietary changes. Due flu and pneumococcal vaccines. Will schedule nurse visit on Friday.   Monitor bp - low at yesterdays visit with GYN. She will check at home. Feels fine.   Lipids are at goal\  Normal renal function   Follow up: 4 weeks.  Meds ordered this encounter  Medications  . Exenatide ER (BYDUREON BCISE) 2 MG/0.85ML AUIJ  Sig: Inject 2 mg into the skin once a week.    Dispense:  4 pen    Refill:  11      I reviewed the patients updated PMH, FH, and SocHx.    Patient Active Problem List   Diagnosis Date Noted  . Mixed hyperlipidemia 03/18/2018    Priority: High  . Chronic narcotic use 03/18/2018    Priority: High  . Essential hypertension 10/06/2017    Priority: High  . Acquired hypothyroidism 08/25/2017    Priority: High  . Controlled type 2 diabetes mellitus without complication, without long-term current use of insulin ([place]) 12/15/2016    Priority: High  . Family history of breast cancer     Priority: High  . Family history of colon cancer     Priority: High  . Exogenous obesity 07/22/2012    Priority: High  .  Reflex sympathetic dystrophy 01/18/2008    Priority: High  . Gastric polyp 03/18/2018    Priority: Medium  . Family history of ovarian cancer     Priority: Medium  . Nephrolithiasis 05/05/2013    Priority: Medium  . B12 deficiency 09/12/2009    Priority: Medium  . GERD 01/18/2008    Priority: Medium  . LACTOSE INTOLERANCE 12/23/2007    Priority: Medium  . Irritable bowel syndrome 12/23/2007    Priority: Medium  . Fibroid uterus 03/18/2018    Priority: Low  . Genetic testing 08/01/2014   Current Meds  Medication Sig  . aspirin 81 MG tablet Take 81 mg by mouth daily.   Marland Kitchen atorvastatin (LIPITOR) 20 MG tablet Take 1 tablet (20 mg total) by mouth daily.  . Calcium-Magnesium-Vitamin D (CALCIUM MAGNESIUM PO) Take 1,500 mg by mouth daily.   . Cholecalciferol (VITAMIN D-3) 5000 units TABS Take 5,000 Units by mouth daily.  . cyanocobalamin (,VITAMIN B-12,) 1000 MCG/ML injection INJECT 1 ML INTRAMUSCULARLY ONCE EVERY MONTH  . escitalopram (LEXAPRO) 10 MG tablet Take 10 mg by mouth daily.   . Eszopiclone (ESZOPICLONE) 3 MG TABS Take 3 mg by mouth at bedtime. Take immediately before bedtime   . fluticasone (FLONASE) 50 MCG/ACT nasal spray PLACE TWO SPRAYS INTO THE NOSE DAILY (Patient taking differently: Place 2 sprays into both nostrils daily as needed for allergies or rhinitis. PLACE TWO SPRAYS INTO THE NOSE DAILY)  . levothyroxine (SYNTHROID) 50 MCG tablet Take 1 tablet (50 mcg total) by mouth daily.  Marland Kitchen lisinopril-hydrochlorothiazide (ZESTORETIC) 20-25 MG tablet Take 1 tablet by mouth daily.  . metFORMIN (GLUCOPHAGE-XR) 500 MG 24 hr tablet Take 1 tablet (500 mg total) by mouth daily with breakfast.  . oxymorphone (OPANA) 10 MG tablet Take 10 mg by mouth 4 (four) times daily as needed for pain.   . pantoprazole (PROTONIX) 40 MG tablet Take 1 tablet (40 mg total) by mouth daily.  Marland Kitchen triamcinolone cream (KENALOG) 0.5 % Apply 1 application topically 3 (three) times daily.    Allergies: Patient  is allergic to morphine and related and sulfa antibiotics. Family History: Patient family history includes Brain cancer in her maternal aunt and maternal uncle; Brain cancer (age of onset: 83) in her mother; Breast cancer in her maternal aunt, maternal aunt and other family members; Breast cancer (age of onset: 30) in her sister; Breast cancer (age of onset: 89) in her cousin; Breast cancer (age of onset: 80) in her maternal aunt; COPD in her father; Colon cancer in her maternal aunt, maternal uncle, and paternal aunt; Colon cancer (age of onset: 22) in her  sister; Heart attack in her mother; Heart disease in her maternal grandfather; Irritable bowel syndrome in her daughter; Kidney disease in her mother; Leukemia in her maternal grandmother; Lung cancer in her maternal uncle; Lung cancer (age of onset: 77) in her father; Lymphoma in her maternal aunt; Osteoporosis in her mother; Other (age of onset: 72) in her mother; Ovarian cancer (age of onset: 31) in her mother; Stroke in her paternal grandfather. Social History:  Patient  reports that she has never smoked. She has never used smokeless tobacco. She reports that she does not drink alcohol or use drugs.  Review of Systems: Constitutional: Negative for fever malaise or anorexia Cardiovascular: negative for chest pain Respiratory: negative for SOB or persistent cough Gastrointestinal: negative for abdominal pain  OBJECTIVE Vitals: Resp 16   Wt 241 lb (109.3 kg)   LMP 05/05/1996   BMI 38.90 kg/m  General: no acute distress , A&Ox3  Leamon Arnt, MD

## 2019-01-05 NOTE — Patient Instructions (Signed)
Please return in 4 weeks for recheck ; virtual visit is fine.  We will give you your flu and pneumonia vaccines on Friday when you are here with your mom.   If you have any questions or concerns, please don't hesitate to send me a message via MyChart or call the office at [phone]. Thank you for visiting with Haley Jenkins today! It's our pleasure caring for you.    Exenatide injection suspension extended-release (autoinjector) What is this medicine? EXENATIDE (ex EN a tide) is used to improve blood sugar control in adults with type 2 diabetes. This medicine may be used with other oral diabetes medicines. This medicine may be used for other purposes; ask your health care provider or pharmacist if you have questions. COMMON BRAND NAME(S): Bydureon BCise What should I tell my health care provider before I take this medicine? They need to know if you have any of these conditions:  endocrine tumors (MEN 2) or if someone in your family had these tumors  history of pancreatitis  kidney disease or if you are on dialysis  low blood counts, like platelets  stomach or intestine problems  thyroid cancer or if someone in your family had thyroid cancer  an unusual or allergic reaction to exenatide, medicines, foods, dyes, or preservatives  pregnant or trying to get pregnant  breast-feeding How should I use this medicine? This medicine is for injection under the skin of your upper leg, stomach area, or upper arm. It is usually given once every week (every 7 days). You will be taught how to prepare and give this medicine. Use exactly as directed. Take your medicine at regular intervals. Do not take it more often than directed. It is important that you put your used autoinjectors in a special sharps container. Do not put them in a trash can. If you do not have a sharps container, call your pharmacist or healthcare provider to get one. A special MedGuide will be given to you by the pharmacist with each  prescription and refill. Be sure to read this information carefully each time. Talk to your pediatrician regarding the use of this medicine in children. Special care may be needed. Overdosage: If you think you have taken too much of this medicine contact a poison control center or emergency room at once. NOTE: This medicine is only for you. Do not share this medicine with others. What if I miss a dose? If you miss a dose, take it as soon as you can, provided your next usual scheduled dose is due at least 3 days later. If you miss a dose and your next usual scheduled dose is due 1 or 2 days later, then do not take the missed dose. Take the next dose at your regular time. Do not take double or extra doses. If you have questions about a missed dose, contact your health care provider for advice. What may interact with this medicine?  acetaminophen  birth control pills  digoxin  insulin and other medicines for diabetes  lisinopril  lovastatin  warfarin Many medications may cause changes in blood sugar, these include:  alcohol containing beverages  antiviral medicines for HIV or AIDS  aspirin and aspirin-like drugs  certain medicines for blood pressure, heart disease, irregular heart beat  certain medicines for cholesterol like fenofibrate, gemfibrozil  chromium  diuretics  female hormones, such as estrogens or progestins, birth control pills  isoniazid  lanreotide  female hormones or anabolic steroids  MAOIs like Carbex, Eldepryl, Marplan, Nardil, and  Parnate  medicines for allergies, asthma, cold, or cough  medicines for depression, anxiety, or psychotic disturbances  medicines for weight loss  niacin  nicotine  NSAIDs, medicines for pain and inflammation, like ibuprofen or naproxen  octreotide  pasireotide  pentamidine  phenytoin  probenecid  quinolone antibiotics such as ciprofloxacin, levofloxacin, ofloxacin  some herbal dietary  supplements  steroid medicines such as prednisone or cortisone  sulfamethoxazole; trimethoprim  thyroid hormones Some medications can hide the warning symptoms of low blood sugar (hypoglycemia). You may need to monitor your blood sugar more closely if you are taking one of these medications. These include:  beta-blockers, often used for high blood pressure or heart problems (examples include atenolol, metoprolol, propranolol)  clonidine  guanethidine  reserpine This list may not describe all possible interactions. Give your health care provider a list of all the medicines, herbs, non-prescription drugs, or dietary supplements you use. Also tell them if you smoke, drink alcohol, or use illegal drugs. Some items may interact with your medicine. What should I watch for while using this medicine? Visit your doctor or health care professional for regular checks on your progress. A test called the HbA1C (A1C) will be monitored. This is a simple blood test. It measures your blood sugar control over the last 2 to 3 months. You will receive this test every 3 to 6 months. Learn how to check your blood sugar. Learn the symptoms of low and high blood sugar and how to manage them. Always carry a quick-source of sugar with you in case you have symptoms of low blood sugar. Examples include hard sugar candy or glucose tablets. Make sure others know that you can choke if you eat or drink when you develop serious symptoms of low blood sugar, such as seizures or unconsciousness. They must get medical help at once. Tell your doctor or health care professional if you have high blood sugar. You might need to change the dose of your medicine. If you are sick or exercising more than usual, you might need to change the dose of your medicine. Do not skip meals. Ask your doctor or health care professional if you should avoid alcohol. Many nonprescription cough and cold products contain sugar or alcohol. These can affect  blood sugar. Autoinjectors should never be shared. Sharing may result in passing of viruses like hepatitis or HIV. Wear a medical ID bracelet or chain, and carry a card that describes your disease and details of your medicine and dosage times. What side effects may I notice from receiving this medicine? Side effects that you should report to your doctor or health care professional as soon as possible:  allergic reactions like skin rash, itching or hives, swelling of the face, lips, or tongue  breathing problems  diarrhea that continues or is severe  lump or swelling on the neck  severe nausea  signs and symptoms of low blood sugar such as feeling anxious, confusion, dizziness, increased hunger, unusually weak or tired, sweating, shakiness, cold, irritable, headache, blurred vision, fast heartbeat, loss of consciousness  signs and symptoms of kidney injury like trouble passing urine or change in the amount of urine  trouble swallowing  unusual bleeding or bruising  unusual stomach upset or pain  vomiting Side effects that usually do not require medical attention (report these to your doctor or health care professional if they continue or are bothersome):  constipation  diarrhea  dizziness  headache  nausea  pain, redness, or irritation at site where injected  stomach upset This list may not describe all possible side effects. Call your doctor for medical advice about side effects. You may report side effects to FDA at 1-800-FDA-1088. Where should I keep my medicine? Keep out of the reach of children. Store this medicine flat in a refrigerator between 2 and 8 degrees C (36 and 46 degrees F). Do not freeze. Do not use if the medicine has been frozen. Protect from light and excessive heat. Remove from the refrigerator 15 minutes before use. The autoinjector can be kept at room temperature not to exceed 30 degrees C (86 degrees F) for no more than a total of 4 weeks, if  needed. Throw away any unused medicine after the expiration date on the label. NOTE: This sheet is a summary. It may not cover all possible information. If you have questions about this medicine, talk to your doctor, pharmacist, or health care provider.  2020 [employer]vier/Gold Standard (2018-07-05 12:59:27)

## 2019-01-06 LAB — CYTOLOGY - PAP: Diagnosis: NEGATIVE

## 2019-01-07 ENCOUNTER — Ambulatory Visit (INDEPENDENT_AMBULATORY_CARE_PROVIDER_SITE_OTHER): Payer: Medicare Other | Admitting: *Deleted

## 2019-01-07 ENCOUNTER — Other Ambulatory Visit: Payer: Self-pay

## 2019-01-07 DIAGNOSIS — Z23 Encounter for immunization: Secondary | ICD-10-CM | POA: Diagnosis not present

## 2019-01-12 ENCOUNTER — Encounter: Payer: Self-pay | Admitting: Family Medicine

## 2019-01-20 DIAGNOSIS — G894 Chronic pain syndrome: Secondary | ICD-10-CM | POA: Diagnosis not present

## 2019-01-20 DIAGNOSIS — G5603 Carpal tunnel syndrome, bilateral upper limbs: Secondary | ICD-10-CM | POA: Diagnosis not present

## 2019-01-20 DIAGNOSIS — G90522 Complex regional pain syndrome I of left lower limb: Secondary | ICD-10-CM | POA: Diagnosis not present

## 2019-01-20 DIAGNOSIS — M4726 Other spondylosis with radiculopathy, lumbar region: Secondary | ICD-10-CM | POA: Diagnosis not present

## 2019-01-31 ENCOUNTER — Other Ambulatory Visit: Payer: Self-pay | Admitting: Family Medicine

## 2019-02-23 ENCOUNTER — Telehealth: Payer: Self-pay | Admitting: Family Medicine

## 2019-02-23 NOTE — Telephone Encounter (Signed)
I called the patient to schedule AWV for both her and her husband, but she declined. VDM (Dee-Dee)

## 2019-03-18 DIAGNOSIS — G90522 Complex regional pain syndrome I of left lower limb: Secondary | ICD-10-CM | POA: Diagnosis not present

## 2019-03-18 DIAGNOSIS — G894 Chronic pain syndrome: Secondary | ICD-10-CM | POA: Diagnosis not present

## 2019-03-18 DIAGNOSIS — G5603 Carpal tunnel syndrome, bilateral upper limbs: Secondary | ICD-10-CM | POA: Diagnosis not present

## 2019-03-18 DIAGNOSIS — M4726 Other spondylosis with radiculopathy, lumbar region: Secondary | ICD-10-CM | POA: Diagnosis not present

## 2019-03-25 DIAGNOSIS — Z79891 Long term (current) use of opiate analgesic: Secondary | ICD-10-CM | POA: Diagnosis not present

## 2019-04-13 DIAGNOSIS — L720 Epidermal cyst: Secondary | ICD-10-CM | POA: Diagnosis not present

## 2019-04-13 DIAGNOSIS — L570 Actinic keratosis: Secondary | ICD-10-CM | POA: Diagnosis not present

## 2019-04-13 DIAGNOSIS — Z85828 Personal history of other malignant neoplasm of skin: Secondary | ICD-10-CM | POA: Diagnosis not present

## 2019-04-20 ENCOUNTER — Other Ambulatory Visit: Payer: Self-pay

## 2019-04-20 ENCOUNTER — Encounter: Payer: Self-pay | Admitting: Family Medicine

## 2019-04-20 ENCOUNTER — Ambulatory Visit (INDEPENDENT_AMBULATORY_CARE_PROVIDER_SITE_OTHER): Payer: Medicare Other | Admitting: Family Medicine

## 2019-04-20 VITALS — BP 152/100 | HR 89 | Temp 97.0°F | Ht 66.0 in | Wt 232.2 lb

## 2019-04-20 DIAGNOSIS — R1012 Left upper quadrant pain: Secondary | ICD-10-CM

## 2019-04-20 DIAGNOSIS — E119 Type 2 diabetes mellitus without complications: Secondary | ICD-10-CM | POA: Diagnosis not present

## 2019-04-20 DIAGNOSIS — G9050 Complex regional pain syndrome I, unspecified: Secondary | ICD-10-CM

## 2019-04-20 DIAGNOSIS — K219 Gastro-esophageal reflux disease without esophagitis: Secondary | ICD-10-CM

## 2019-04-20 DIAGNOSIS — K581 Irritable bowel syndrome with constipation: Secondary | ICD-10-CM

## 2019-04-20 DIAGNOSIS — I10 Essential (primary) hypertension: Secondary | ICD-10-CM

## 2019-04-20 LAB — CBC WITH DIFFERENTIAL/PLATELET
Basophils Absolute: 0 10*3/uL (ref 0.0–0.1)
Basophils Relative: 0.6 % (ref 0.0–3.0)
Eosinophils Absolute: 0.1 10*3/uL (ref 0.0–0.7)
Eosinophils Relative: 2.1 % (ref 0.0–5.0)
HCT: 42.1 % (ref 36.0–46.0)
Hemoglobin: 14.5 g/dL (ref 12.0–15.0)
Lymphocytes Relative: 20.9 % (ref 12.0–46.0)
Lymphs Abs: 1.5 10*3/uL (ref 0.7–4.0)
MCHC: 34.3 g/dL (ref 30.0–36.0)
MCV: 92 fl (ref 78.0–100.0)
Monocytes Absolute: 0.5 10*3/uL (ref 0.1–1.0)
Monocytes Relative: 6.6 % (ref 3.0–12.0)
Neutro Abs: 5 10*3/uL (ref 1.4–7.7)
Neutrophils Relative %: 69.8 % (ref 43.0–77.0)
Platelets: 252 10*3/uL (ref 150.0–400.0)
RBC: 4.58 Mil/uL (ref 3.87–5.11)
RDW: 13.5 % (ref 11.5–15.5)
WBC: 7.1 10*3/uL (ref 4.0–10.5)

## 2019-04-20 LAB — POCT GLYCOSYLATED HEMOGLOBIN (HGB A1C): Hemoglobin A1C: 5.5 % (ref 4.0–5.6)

## 2019-04-20 LAB — COMPREHENSIVE METABOLIC PANEL
ALT: 10 U/L (ref 0–35)
AST: 12 U/L (ref 0–37)
Albumin: 4.4 g/dL (ref 3.5–5.2)
Alkaline Phosphatase: 65 U/L (ref 39–117)
BUN: 12 mg/dL (ref 6–23)
CO2: 33 mEq/L — ABNORMAL HIGH (ref 19–32)
Calcium: 9.8 mg/dL (ref 8.4–10.5)
Chloride: 101 mEq/L (ref 96–112)
Creatinine, Ser: 1 mg/dL (ref 0.40–1.20)
GFR: 55.28 mL/min — ABNORMAL LOW (ref >60.00)
Glucose, Bld: 125 mg/dL — ABNORMAL HIGH (ref 70–99)
Potassium: 4.1 mEq/L (ref 3.5–5.1)
Sodium: 140 mEq/L (ref 135–145)
Total Bilirubin: 0.7 mg/dL (ref 0.2–1.2)
Total Protein: 6.5 g/dL (ref 6.0–8.3)

## 2019-04-20 LAB — LIPASE: Lipase: 5 U/L — ABNORMAL LOW (ref 11.0–59.0)

## 2019-04-20 NOTE — Progress Notes (Signed)
Subjective  CC:  Chief Complaint  Patient presents with  . Abdomen Pain    Upper left stomach for 2 months    HPI: Haley Jenkins is a 68 y.o. female who presents to the office today for follow up of diabetes and problems listed above in the chief complaint.   Diabetes follow up: Her diabetic control is reported as Improved. Started Bcise in September, had mild nausea at first but this has improved. Has noted drop in appetite and weight loss as well. Feels better with better energy.  She denies exertional CP or SOB or symptomatic hypoglycemia. She denies foot sores or paresthesias.   Has chronic left leg pain due to RSD on chronic opiates, chronic constipation and IBS: all unchanged. However, has had upper abdominal pain x 2 months, noted mostly when lies on right side at night to sleep. If rolls to back, she feels better. Last weekend, the pain awoke her from sleep, and then persisted throughout the day. It resolved spontaneously. She hasn't been on nsaids. She is on chronic proton pump inhibitor. She denies n/v or change in bms. Has chronic consitpation with IBS and chronic opioid use. No melena. Weight loss and decreased appetite correlate with the addition of Bcise. No change in pain with meals. No urinary sxs. No back pain.   HTN: had been well controlled. Home readings: as recent as last week as 132/78 by pt report. No cp, palpitations or sob. She admits she is stressed due to need for her elderly mother to have to have pacer heart procedure on 07-14-2022 because it "died". Due to covid surge, she is upset about this.   Wt Readings from Last 3 Encounters:  04/20/19 232 lb 3.2 oz (105.3 kg)  01/05/19 241 lb (109.3 kg)  01/04/19 242 lb (109.8 kg)    BP Readings from Last 3 Encounters:  04/20/19 (!) 152/100  01/04/19 108/60  09/02/18 (!) 143/82    Assessment  1. LUQ abdominal pain   2. Controlled type 2 diabetes mellitus without complication, without long-term current use of insulin  ([place])   3. Reflex sympathetic dystrophy   4. Gastroesophageal reflux disease, unspecified whether esophagitis present   5. Irritable bowel syndrome with constipation   6. Essential hypertension      Plan   Diabetes is currently very well controlled. The addition of glp-1 has helped, however concerned about pancreatitis due to new med. Will hold for now and check labs/ultrasound. Will adjust diabetes medications once her dx for abd pain is more clear. Reassured pt  Upper abdominal pain: midepigastric and LUQ with benign abdomen: check labs and ultrasound. Double up on PPI< protonix. 2 week f/u, sooner if worsens.   HTN: to start checking home readings daily and send me numbers. Suspect htn response due to pain and stress; if remains elevated, will adjust bp meds. She didn't take her meds this am either.    Follow up: Return in about 3 months (around 07/19/2019) for follow up of diabetes and hypertension.. Orders Placed This Encounter  Procedures  . US Abdomen Complete  . CBC w/Diff  . CMP  . Lipase  . A1C POCT   No orders of the defined types were placed in this encounter.     Immunization History  Administered Date(s) Administered  . Fluad Quad(high Dose 65+) 01/07/2019  . Influenza Split 02/03/2012  . Influenza,inj,Quad PF,6+ Mos 03/18/2018  . Pneumococcal Conjugate-13 08/25/2017  . Pneumococcal Polysaccharide-23 01/07/2019  . Tdap 06/19/2010  Diabetes Related Lab Review: Lab Results  Component Value Date   HGBA1C 5.5 04/20/2019   HGBA1C 7.5 (H) 01/04/2019   HGBA1C 7.4 (H) 09/09/2018    Lab Results  Component Value Date   MICROALBUR 6.1 (H) 09/09/2018   Lab Results  Component Value Date   CREATININE 0.85 09/09/2018   BUN 15 09/09/2018   NA 141 09/09/2018   K 4.3 09/09/2018   CL 103 09/09/2018   CO2 31 09/09/2018   Lab Results  Component Value Date   CHOL 127 09/09/2018   CHOL 130 03/18/2018   CHOL 220 (H) 08/25/2017   Lab Results  Component Value  Date   HDL 32.90 (L) 09/09/2018   HDL 33.90 (L) 03/18/2018   HDL 37.60 (L) 08/25/2017   Lab Results  Component Value Date   LDLCALC 69 09/09/2018   LDLCALC 62 03/18/2018   LDLCALC 145 (H) 08/25/2017   Lab Results  Component Value Date   TRIG 125.0 09/09/2018   TRIG 171.0 (H) 03/18/2018   TRIG 187.0 (H) 08/25/2017   Lab Results  Component Value Date   CHOLHDL 4 09/09/2018   CHOLHDL 4 03/18/2018   CHOLHDL 6 08/25/2017   Lab Results  Component Value Date   LDLDIRECT 153.5 05/31/2012   LDLDIRECT 149.0 02/15/2010   LDLDIRECT 167.4 02/05/2009   The ASCVD Risk score (Goff DC Jr., et al., 2013) failed to calculate for the following reasons:   The valid total cholesterol range is 130 to 320 mg/dL I have reviewed the PMH, Fam and Soc history. Patient Active Problem List   Diagnosis Date Noted  . Mixed hyperlipidemia 03/18/2018    Priority: High  . Chronic narcotic use 03/18/2018    Priority: High    For RSD left lower leg managed by pain mgt.   . Essential hypertension 10/06/2017    Priority: High  . Acquired hypothyroidism 08/25/2017    Priority: High  . Controlled type 2 diabetes mellitus without complication, without long-term current use of insulin ([place]) 12/15/2016    Priority: High    Hemoglobin A1c 6.7, August 2018   . Family history of breast cancer     Priority: High  . Family history of colon cancer     Priority: High  . Exogenous obesity 07/22/2012    Priority: High  . Reflex sympathetic dystrophy 01/18/2008    Priority: High    Left lower leg s/p complex achilles and calf rupture.   . Gastric polyp 03/18/2018    Priority: Medium  . Family history of ovarian cancer     Priority: Medium  . Nephrolithiasis 05/05/2013    Priority: Medium  . B12 deficiency 09/12/2009    Priority: Medium  . GERD 01/18/2008    Priority: Medium    Qualifier: Diagnosis of  By: Nils Pyle CMA ([place]), Mearl Latin     . LACTOSE INTOLERANCE 12/23/2007    Priority: Medium  .  Irritable bowel syndrome 12/23/2007    Priority: Medium  . Fibroid uterus 03/18/2018    Priority: Low  . Genetic testing 08/01/2014    Negative genetic testing on the Comprehensive Cancer Panel.  The Comprehensive Cancer Panel offered by [facility] includes sequencing and/or deletion duplication testing of the following 32 genes: APC, ATM, AXIN2, BARD1, BMPR1A, BRCA1, BRCA2, BRIP1, CDH1, CDK4, CDKN2A, CHEK2, EPCAM, FANCC, MLH1, MSH2, MSH6, MUTYH, NBN, PALB2, PMS2, POLD1, POLE, PTEN, RAD51C, RAD51D, SCG5/GREM1, SMAD4, STK11, TP53, VHL, and XRCC2.   The report date is July 31, 2014.     Social  History: Patient  reports that she has never smoked. She has never used smokeless tobacco. She reports that she does not drink alcohol or use drugs.  Review of Systems: Ophthalmic: negative for eye pain, loss of vision or double vision Cardiovascular: negative for chest pain Respiratory: negative for SOB or persistent cough Gastrointestinal: +for abdominal pain Genitourinary: negative for dysuria or gross hematuria MSK: negative for foot lesions Neurologic: negative for weakness or gait disturbance  Objective  Vitals: BP (!) 152/100   Pulse 89   Temp (!) 97 F (36.1 C) (Temporal)   Ht _0  (1.676 m)   Wt 232 lb 3.2 oz (105.3 kg)   LMP 05/05/1996   SpO2 93%   BMI 37.48 kg/m  General: well appearing, no acute distress , appears comfortable Psych:  Alert and oriented, normal mood and affect HEENT:  Normocephalic, atraumatic, moist mucous membranes, supple neck  Cardiovascular:  Nl S1 and S2, RRR without murmur, gallop or rub. no edema Respiratory:  Good breath sounds bilaterally, CTAB with normal effort, no rales Gastrointestinal: normal BS, soft, mild midepigastric and LUQ ttp w/o rebound or guarding. No masses palpated. No HSM Skin:  Warm, no rashes Neurologic:   Mental status is normal. normal gait    Diabetic education: ongoing education regarding chronic disease management for diabetes  was given today. We continue to reinforce the ABC's of diabetic management: A1c (<7 or 8 dependent upon patient), tight blood pressure control, and cholesterol management with goal LDL < 100 minimally. We discuss diet strategies, exercise recommendations, medication options and possible side effects. At each visit, we review recommended immunizations and preventive care recommendations for diabetics and stress that good diabetic control can prevent other problems. See below for this patient's data.    Commons side effects, risks, benefits, and alternatives for medications and treatment plan prescribed today were discussed, and the patient expressed understanding of the given instructions. Patient is instructed to call or message via MyChart if he/she has any questions or concerns regarding our treatment plan. No barriers to understanding were identified. We discussed Red Flag symptoms and signs in detail. Patient expressed understanding regarding what to do in case of urgent or emergency type symptoms.   Medication list was reconciled, printed and provided to the patient in AVS. Patient instructions and summary information was reviewed with the patient as documented in the AVS. This note was prepared with assistance of Dragon voice recognition software. Occasional wrong-word or sound-a-like substitutions may have occurred due to the inherent limitations of voice recognition software  This visit occurred during the SARS-CoV-2 public health emergency.  Safety protocols were in place, including screening questions prior to the visit, additional usage of staff PPE, and extensive cleaning of exam room while observing appropriate contact time as indicated for disinfecting solutions.

## 2019-04-20 NOTE — Patient Instructions (Signed)
Please return in 3 months for diabetes follow up [place] if abdominal symptoms worsen.  Increase your protonix to twice daily for the next 2 weeks to see if this helps the burning pain.  I will release your lab results to you on your MyChart account with further instructions. Please reply with any questions.   We will call you to get the ultrasound scheduled. We will proceed to a CT scan if we don't find anything OR if your symptoms persist.   Hold your Bcise injections for now to see if this helps improve the pain.   Please send me some blood pressure readings over the next few days; your blood pressure is very high today.   If you have any questions or concerns, please don't hesitate to send me a message via MyChart or call the office at [phone]. Thank you for visiting with [place] today! It's our pleasure caring for you.

## 2019-05-05 ENCOUNTER — Ambulatory Visit
Admission: RE | Admit: 2019-05-05 | Discharge: 2019-05-05 | Disposition: A | Discharge status: Home / Self Care | Payer: Medicare Other | Source: Ambulatory Visit | Attending: Family Medicine | Admitting: Family Medicine

## 2019-05-05 DIAGNOSIS — R1012 Left upper quadrant pain: Secondary | ICD-10-CM

## 2019-05-09 ENCOUNTER — Encounter: Payer: Self-pay | Admitting: Family Medicine

## 2019-05-09 DIAGNOSIS — K760 Fatty (change of) liver, not elsewhere classified: Secondary | ICD-10-CM

## 2019-05-09 HISTORY — DX: Fatty (change of) liver, not elsewhere classified: K76.0

## 2019-05-23 IMAGING — MG 2D DIGITAL SCREENING BILATERAL MAMMOGRAM WITH 3D TOMO WITH CAD
8 of 12 series · 8 of 28 positions shown · non-contrast
Comparison: Previous exam(s).

CLINICAL DATA: Screening.

EXAM:
2D DIGITAL SCREENING BILATERAL MAMMOGRAM WITH 3D TOMO WITH CAD

[L CC]
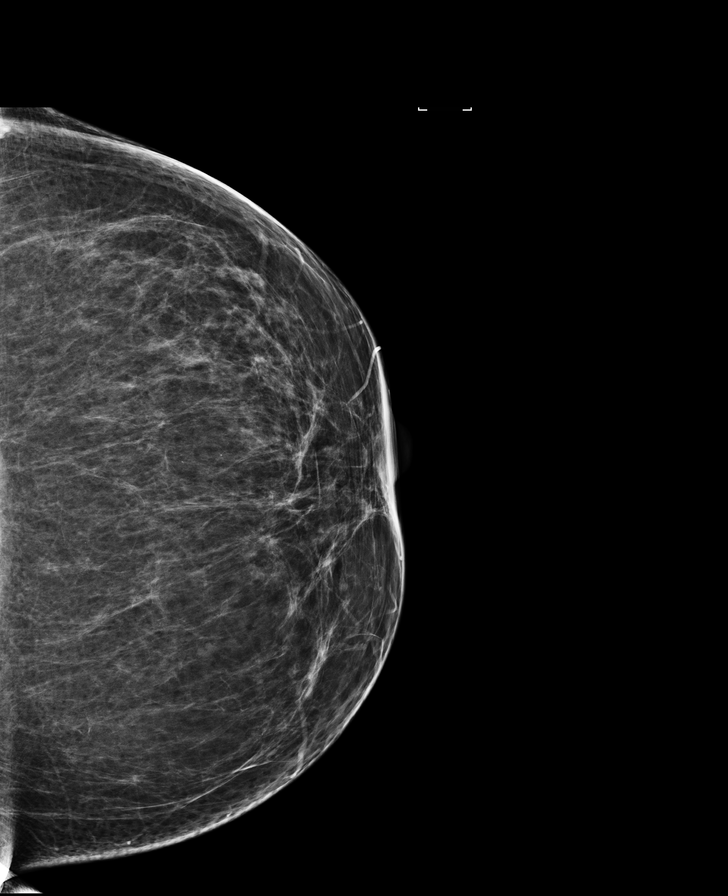

[L MLO synth-2D]
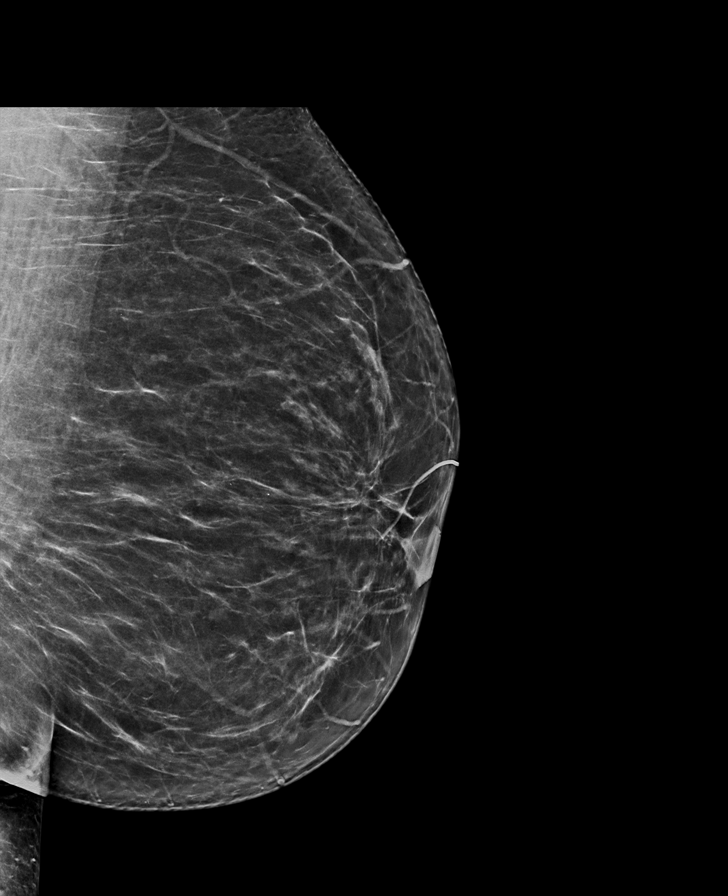

[L CC synth-2D]
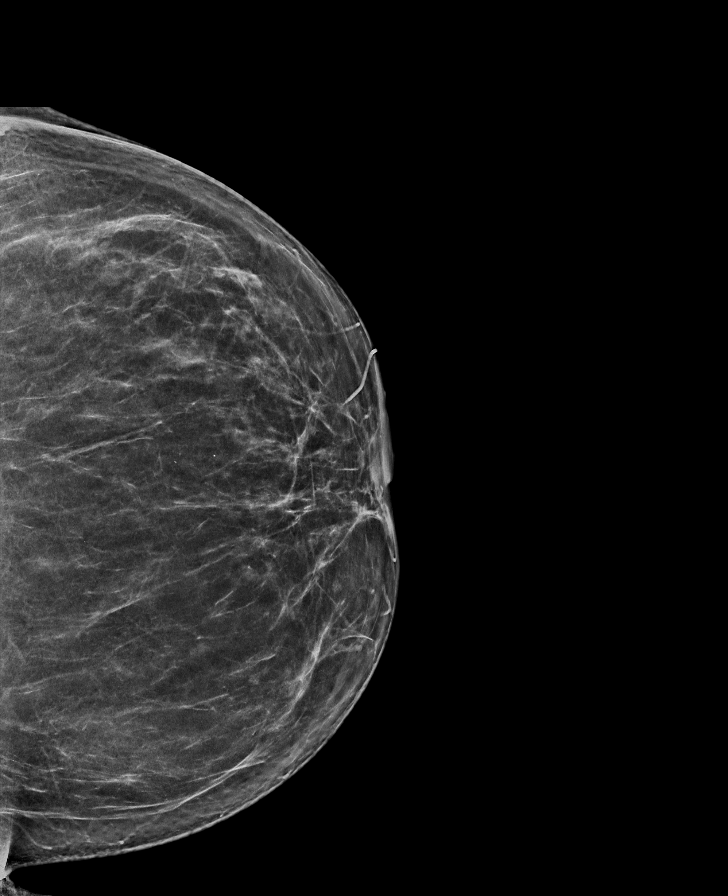

[R CC synth-2D]
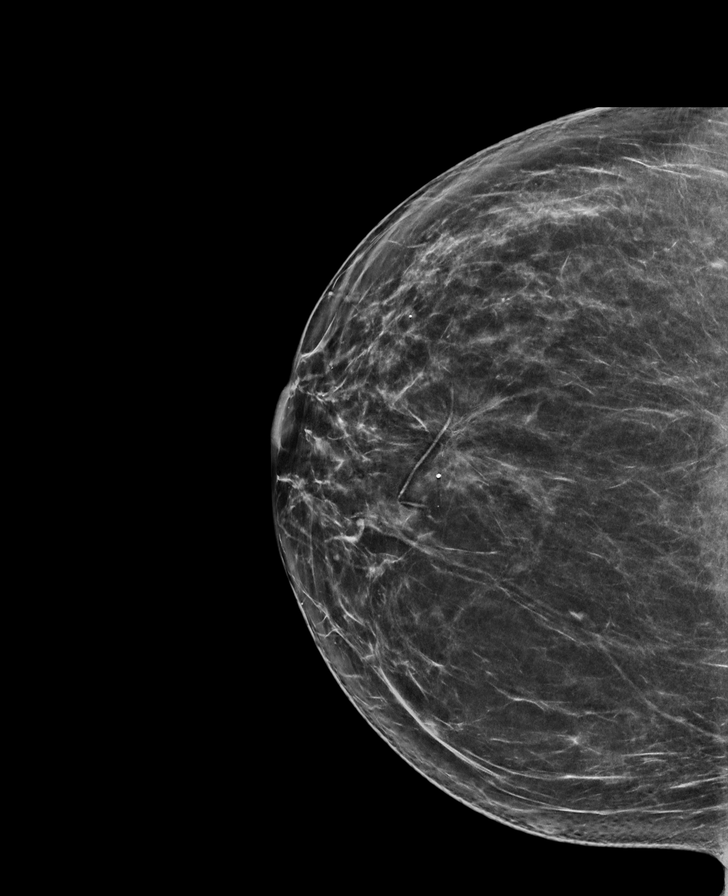

[R CC]
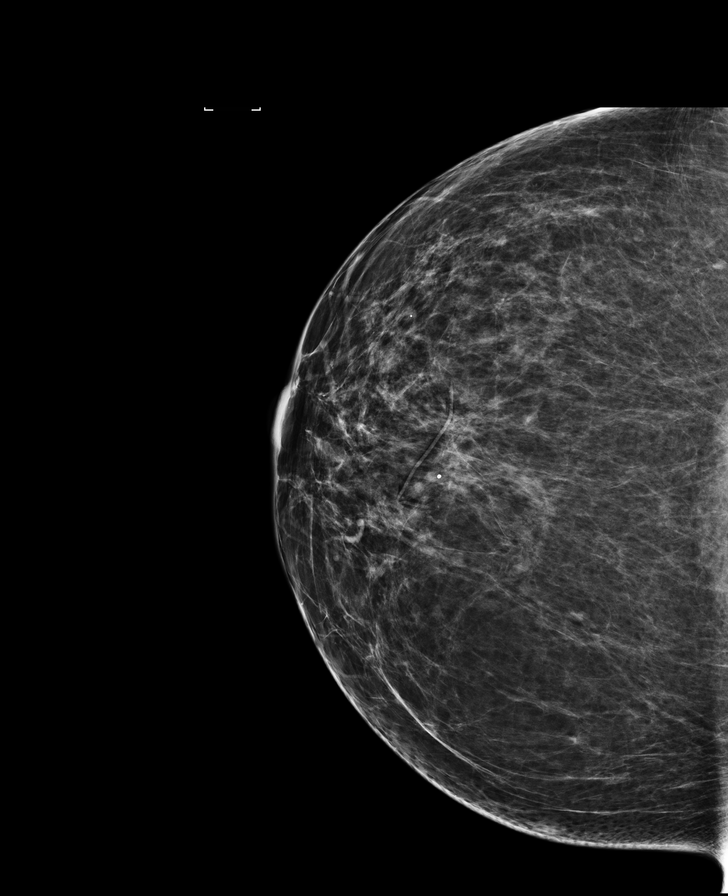

[L MLO]
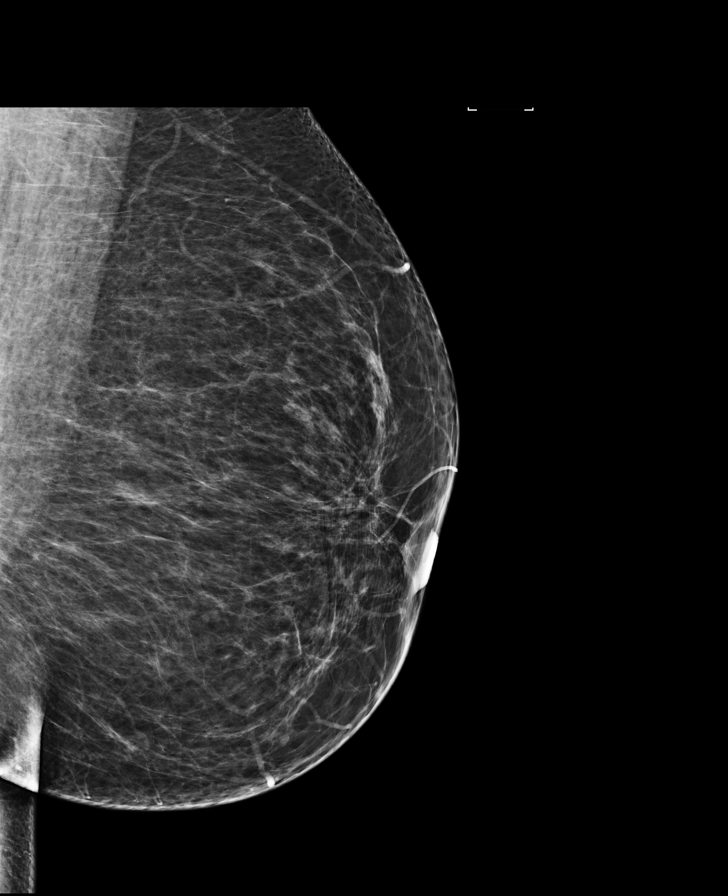

[R MLO]
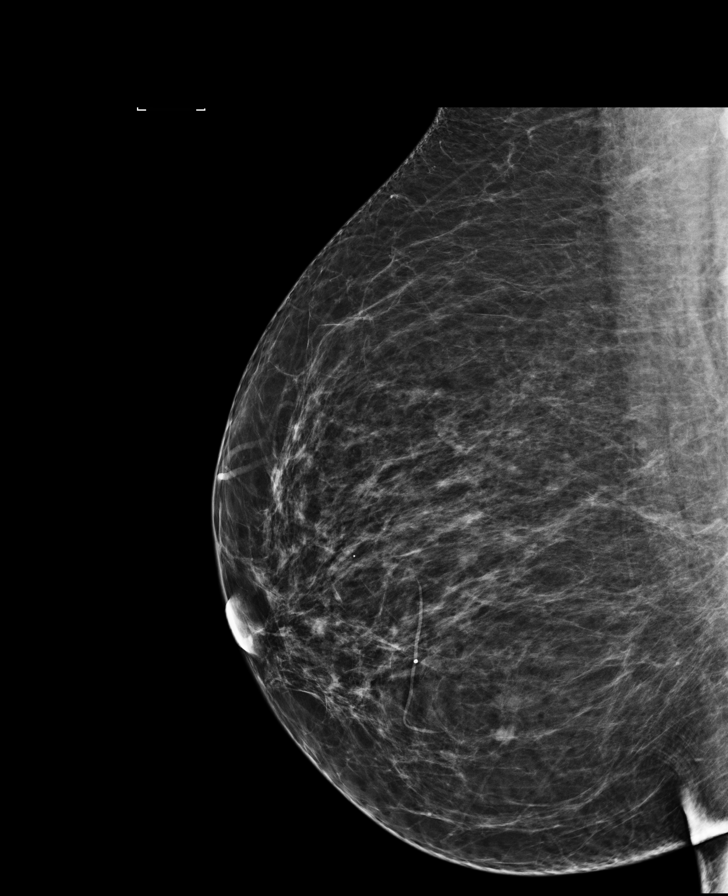

[R MLO synth-2D]
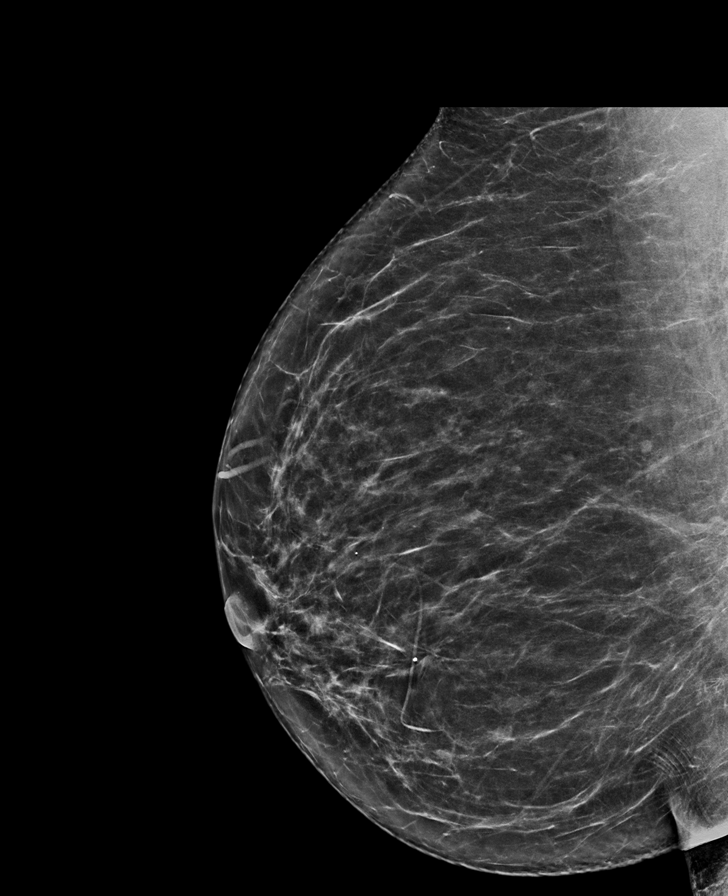

[8 of 28 positions shown; findings below may reference images not displayed]

ACR Breast Density Category b: There are scattered areas of
fibroglandular density.
FINDINGS: There are no findings suspicious for malignancy. Images were
processed with CAD.
IMPRESSION: No mammographic evidence of malignancy. A result letter of this
screening mammogram will be mailed directly to the patient.

RECOMMENDATION:
Screening mammogram in one year. (Code:[id number])

BI-RADS CATEGORY  1: Negative.

## 2019-06-03 ENCOUNTER — Other Ambulatory Visit: Payer: Self-pay | Admitting: Obstetrics & Gynecology

## 2019-06-03 DIAGNOSIS — Z1231 Encounter for screening mammogram for malignant neoplasm of breast: Secondary | ICD-10-CM

## 2019-06-19 ENCOUNTER — Other Ambulatory Visit: Payer: Self-pay

## 2019-06-19 ENCOUNTER — Ambulatory Visit: Payer: Medicare Other | Attending: Internal Medicine

## 2019-06-19 DIAGNOSIS — Z23 Encounter for immunization: Secondary | ICD-10-CM | POA: Insufficient documentation

## 2019-06-19 NOTE — Progress Notes (Signed)
   Covid-19 Vaccination Clinic  Name:  Haley Jenkins    MRN: [id number] DOB: 11-22-1950  06/19/2019  [name] was observed post Covid-19 immunization for 15 minutes without incidence. She was provided with Vaccine Information Sheet and instruction to access the V-Safe system.   [name] was instructed to call 911 with any severe reactions post vaccine: Marland Kitchen Difficulty breathing  . Swelling of your face and throat  . A fast heartbeat  . A bad rash all over your body  . Dizziness and weakness    Immunizations Administered    Name Date Dose VIS Date Route   Moderna COVID-19 Vaccine 06/19/2019  2:00 PM 0.5 mL 04/05/2019 Intramuscular   Manufacturer: Moderna   Lot: [id number]   [place]:[id number]

## 2019-07-14 ENCOUNTER — Other Ambulatory Visit: Payer: Self-pay

## 2019-07-14 ENCOUNTER — Ambulatory Visit
Admission: RE | Admit: 2019-07-14 | Discharge: 2019-07-14 | Disposition: A | Discharge status: Home / Self Care | Payer: Medicare Other | Source: Ambulatory Visit | Attending: Obstetrics & Gynecology | Admitting: Obstetrics & Gynecology

## 2019-07-14 DIAGNOSIS — Z1231 Encounter for screening mammogram for malignant neoplasm of breast: Secondary | ICD-10-CM | POA: Diagnosis not present

## 2019-07-17 ENCOUNTER — Ambulatory Visit: Payer: Medicare Other | Attending: Internal Medicine

## 2019-07-17 DIAGNOSIS — Z23 Encounter for immunization: Secondary | ICD-10-CM

## 2019-07-17 NOTE — Progress Notes (Signed)
   Covid-19 Vaccination Clinic  Name:  Haley Jenkins    MRN: [id number] DOB: [birth year]  07/17/2019  Haley Jenkins was observed post Covid-19 immunization for 15 minutes without incident. She was provided with Vaccine Information Sheet and instruction to access the V-Safe system.   Haley Jenkins was instructed to call 911 with any severe reactions post vaccine: Marland Kitchen Difficulty breathing  . Swelling of face and throat  . A fast heartbeat  . A bad rash all over body  . Dizziness and weakness   Immunizations Administered    Name Date Dose VIS Date Route   Moderna COVID-19 Vaccine 07/17/2019  1:04 PM 0.5 mL 04/05/2019 Intramuscular   Manufacturer: Moderna   Lot: [id number]   [place]:[id number]

## 2019-08-01 ENCOUNTER — Other Ambulatory Visit: Payer: Self-pay | Admitting: Family Medicine

## 2019-08-19 DIAGNOSIS — G5603 Carpal tunnel syndrome, bilateral upper limbs: Secondary | ICD-10-CM | POA: Diagnosis not present

## 2019-08-19 DIAGNOSIS — M4726 Other spondylosis with radiculopathy, lumbar region: Secondary | ICD-10-CM | POA: Diagnosis not present

## 2019-08-19 DIAGNOSIS — G90522 Complex regional pain syndrome I of left lower limb: Secondary | ICD-10-CM | POA: Diagnosis not present

## 2019-08-19 DIAGNOSIS — G894 Chronic pain syndrome: Secondary | ICD-10-CM | POA: Diagnosis not present

## 2019-09-07 ENCOUNTER — Other Ambulatory Visit: Payer: Self-pay | Admitting: Family Medicine

## 2019-09-20 ENCOUNTER — Telehealth: Payer: Self-pay | Admitting: *Deleted

## 2019-09-20 DIAGNOSIS — Z800 Family history of malignant neoplasm of digestive organs: Secondary | ICD-10-CM

## 2019-09-20 DIAGNOSIS — Z803 Family history of malignant neoplasm of breast: Secondary | ICD-10-CM

## 2019-09-20 DIAGNOSIS — Z9189 Other specified personal risk factors, not elsewhere classified: Secondary | ICD-10-CM

## 2019-09-20 DIAGNOSIS — Z8041 Family history of malignant neoplasm of ovary: Secondary | ICD-10-CM

## 2019-09-20 NOTE — Telephone Encounter (Signed)
-----   Message from Megan Salon, MD sent at 09/19/2019  4:36 AM EDT ----- Regarding: breast MRI Pt has increased lifetime risk for breast cancer.  She did last breast MRI in 2019.  Could you call and see if she wants this done this year.  She just did [facility] 07/2019 so I would suggest doing the breast MRI 01/2020 to have six months between imaging but ok to proceed now if she wants.  Thanks.  Vinnie Level

## 2019-09-20 NOTE — Telephone Encounter (Signed)
Spoke with patient, advised as seen below per Dr. Sabra Heck. Patient agreeable to proceed with MRI breast w/wo contrast in 01/2020, order placed for [facility]. Advised patient [facility] will contact her directly to schedule, our office will precert once scheduled. Patient verbalizes understanding and is agreeable.   Placed in [place] hold.   Routing to provider for final review. Patient is agreeable to disposition. Will close encounter.  Cc: Hayley Carder

## 2019-09-27 ENCOUNTER — Other Ambulatory Visit: Payer: Self-pay | Admitting: Family Medicine

## 2019-09-27 NOTE — Telephone Encounter (Signed)
Pt is due for her CPE visit and f/u HTN / high cholesterol. Please call to schedule. Will refill her meds once for 90 days but needs OV and lab work prior to next refill. Last cpe and labs was 09/2018. Thanks.

## 2019-09-28 NOTE — Telephone Encounter (Signed)
Called pt. Pt states she will look at her schedule and make an appointment online.

## 2019-10-25 ENCOUNTER — Other Ambulatory Visit: Payer: Self-pay | Admitting: Family Medicine

## 2019-10-25 DIAGNOSIS — I10 Essential (primary) hypertension: Secondary | ICD-10-CM

## 2019-10-26 ENCOUNTER — Encounter: Payer: Self-pay | Admitting: Gastroenterology

## 2019-12-06 ENCOUNTER — Other Ambulatory Visit: Payer: Self-pay | Admitting: Family Medicine

## 2019-12-21 ENCOUNTER — Ambulatory Visit: Payer: Medicare Other | Admitting: Gastroenterology

## 2019-12-22 ENCOUNTER — Ambulatory Visit (INDEPENDENT_AMBULATORY_CARE_PROVIDER_SITE_OTHER): Payer: Medicare Other | Admitting: Gastroenterology

## 2019-12-22 ENCOUNTER — Encounter: Payer: Self-pay | Admitting: Gastroenterology

## 2019-12-22 VITALS — BP 108/68 | HR 84 | Ht 66.0 in | Wt 227.2 lb

## 2019-12-22 DIAGNOSIS — K5903 Drug induced constipation: Secondary | ICD-10-CM

## 2019-12-22 DIAGNOSIS — Z1211 Encounter for screening for malignant neoplasm of colon: Secondary | ICD-10-CM

## 2019-12-22 DIAGNOSIS — T402X5A Adverse effect of other opioids, initial encounter: Secondary | ICD-10-CM

## 2019-12-22 DIAGNOSIS — R112 Nausea with vomiting, unspecified: Secondary | ICD-10-CM

## 2019-12-22 DIAGNOSIS — R1013 Epigastric pain: Secondary | ICD-10-CM

## 2019-12-22 MED ORDER — PLENVU 140 G PO SOLR: 140.0000 g | ORAL | 0 refills | Status: DC

## 2019-12-22 NOTE — Patient Instructions (Signed)
If you are age 68 or older, your body mass index should be between 23-30. Your Body mass index is 36.68 kg/m. If this is out of the aforementioned range listed, please consider follow up with your Primary Care Provider.  If you are age 36 or younger, your body mass index should be between 19-25. Your Body mass index is 36.68 kg/m. If this is out of the aformentioned range listed, please consider follow up with your Primary Care Provider.   You have been scheduled for an endoscopy and colonoscopy. Please follow the written instructions given to you at your visit today. Please pick up your prep supplies at the pharmacy within the next 1-3 days. If you use inhalers (even only as needed), please bring them with you on the day of your procedure.   It was a pleasure to see you today!  Dr. Loletha Carrow

## 2019-12-22 NOTE — Progress Notes (Signed)
[facility] Consult Note:  History: Haley Jenkins 12/22/2019  Referring provider: Leamon Arnt, MD  Reason for consult/chief complaint: discuss colonoscopy (last colon in 2009), Nausea (with vomitting), and Abdominal Pain (epigastric pain)   Subjective  HPI:  This is a 69 year old woman referred to see me for abdominal pain with nausea and vomiting.  She recalls that this problem was occurring as far back as 2009 when she first saw Dr. Sharlett Iles, and there have been long periods of time where he was better only to flareup again.  She recalls an extensive work-up with Dr. Sharlett Iles, because of symptoms remained unclear.  She was on chronic opioids at that point and has remained on them since then. She will intermittently have morning epigastric discomfort that is burning or dull and then nausea with bilious vomiting.  She does not vomit up undigested food.  Symptoms do not seem to correlate with meals.  Denies dysphagia or odynophagia.Harlym also has many years of constipation from her opioids, will take a stool softener daily but still might go 2 or 3 days between BMs.  She had not taken any prescription medicines, not sure if she had taken any MiraLAX before.   ROS:  Review of Systems  Constitutional: Negative for appetite change and unexpected weight change.  HENT: Negative for mouth sores and voice change.   Eyes: Negative for pain and redness.  Respiratory: Negative for cough and shortness of breath.   Cardiovascular: Negative for chest pain and palpitations.  Genitourinary: Negative for dysuria and hematuria.  Musculoskeletal: Negative for arthralgias and myalgias.       Chronic musculoskeletal pain  Skin: Negative for pallor and rash.  Neurological: Negative for weakness and headaches.  Hematological: Negative for adenopathy.     Past Medical History: Past Medical History:  Diagnosis Date  . Arthritis   . BRCA negative 2/13   1, 2  negative-BART negative 09/10/11  . Carpal tunnel syndrome   . Cleft palate    repaired at birth  . Family history of breast cancer   . Family history of colon cancer   . Family history of ovarian cancer   . Fracture of knee region 10/2014   Bilateral   . Gastric polyp   . GERD (gastroesophageal reflux disease)   . Hepatic steatosis 05/09/2019   By ultrasound 04/2019  . Hiatal hernia   . Hypothyroidism   . Irritable bowel syndrome   . Kidney stone   . Lactose intolerance   . Nephrolithiasis 2015  . RSD (reflex sympathetic dystrophy)      Past Surgical History: Past Surgical History:  Procedure Laterality Date  . BREAST BIOPSY Left   . BREAST EXCISIONAL BIOPSY Right   . breast surgery cluster cyst removed     x 2  . CARPAL TUNNEL RELEASE Left 2019  . cleft palate repaired  birth, 24   [facility]  . COLONOSCOPY    . GANGLION CYST EXCISION Right 11/04/2016  . [place], 2003  . KNEE ARTHROSCOPY Bilateral 11/2014   [facility]  . KNEE SURGERY Left 1976  . TUBAL LIGATION  1987  . URETHRAL SLING  11/13   mid-urethral sling     Family History: Family History  Problem Relation Age of Onset  . Kidney disease Mother   . Osteoporosis Mother   . Heart attack Mother        pacemaker  . Other Mother 22  meningioma-removed on 05/26/13  . Ovarian cancer Mother 91  . Brain cancer Mother 55       meningioma  . COPD Father   . Lung cancer Father 76  . Breast cancer Sister 23       40 at diagnosis.  Recurrence 2017 at 68 yo.  . Irritable bowel syndrome Daughter   . Heart disease Maternal Grandfather   . Breast cancer Other        MGM's two sisters  . Leukemia Maternal Grandmother   . Breast cancer Maternal Aunt 60  . Brain cancer Maternal Uncle   . Colon cancer Paternal Aunt   . Stroke Paternal Grandfather   . Colon cancer Sister 50  . Breast cancer Maternal Aunt   . Colon cancer Maternal Aunt   . Lymphoma Maternal Aunt   . Breast  cancer Maternal Aunt   . Brain cancer Maternal Aunt   . Lung cancer Maternal Uncle        smoker  . Colon cancer Maternal Uncle   . Breast cancer Other        MGM's mother  . Breast cancer Cousin 68       maternal first cousin    Social History: Social History   Socioeconomic History  . Marital status: Married    Spouse name: Not on file  . Number of children: 2  . Years of education: Not on file  . Highest education level: Not on file  Occupational History  . Not on file  Tobacco Use  . Smoking status: Never Smoker  . Smokeless tobacco: Never Used  Vaping Use  . Vaping Use: Never used  Substance and Sexual Activity  . Alcohol use: No  . Drug use: No  . Sexual activity: Yes    Partners: Male    Birth control/protection: Surgical, Post-menopausal    Comment: BTSP  Other Topics Concern  . Not on file  Social History Narrative  . Not on file   Social Determinants of Health   Financial Resource Strain:   . Difficulty of Paying Living Expenses: Not on file  Food Insecurity:   . Worried About Charity fundraiser in the Last Year: Not on file  . Ran Out of Food in the Last Year: Not on file  Transportation Needs:   . Lack of Transportation (Medical): Not on file  . Lack of Transportation (Non-Medical): Not on file  Physical Activity:   . Days of Exercise per Week: Not on file  . Minutes of Exercise per Session: Not on file  Stress:   . Feeling of Stress : Not on file  Social Connections:   . Frequency of Communication with Friends and Family: Not on file  . Frequency of Social Gatherings with Friends and Family: Not on file  . Attends Religious Services: Not on file  . Active Member of Clubs or Organizations: Not on file  . Attends Archivist Meetings: Not on file  . Marital Status: Not on file    Allergies: Allergies  Allergen Reactions  . Morphine And Related Rash and Other (See Comments)    HallucinationS  . Sulfa Antibiotics Nausea And  Vomiting and Nausea Only    Outpatient Meds: Current Outpatient Medications  Medication Sig Dispense Refill  . aspirin 81 MG tablet Take 81 mg by mouth daily.     Marland Kitchen atorvastatin (LIPITOR) 20 MG tablet TAKE 1 TABLET DAILY 90 tablet 0  . Calcium-Magnesium-Vitamin D (CALCIUM MAGNESIUM PO) Take  1,500 mg by mouth daily.     . Cholecalciferol (VITAMIN D-3) 5000 units TABS Take 5,000 Units by mouth daily.    . cyanocobalamin (,VITAMIN B-12,) 1000 MCG/ML injection INJECT 1 ML INTRAMUSCULARLY ONCE EVERY MONTH 3 mL 0  . Eszopiclone (ESZOPICLONE) 3 MG TABS Take 3 mg by mouth at bedtime. Take immediately before bedtime     . Exenatide ER (BYDUREON BCISE) 2 MG/0.85ML AUIJ Inject 2 mg into the skin once a week. 4 pen 11  . fluticasone (FLONASE) 50 MCG/ACT nasal spray PLACE TWO SPRAYS INTO THE NOSE DAILY 16 g 5  . levothyroxine (SYNTHROID) 50 MCG tablet Take 1 tablet (50 mcg total) by mouth daily. 90 tablet 4  . lisinopril-hydrochlorothiazide (ZESTORETIC) 20-25 MG tablet TAKE 1 TABLET DAILY 90 tablet 0  . oxymorphone (OPANA) 10 MG tablet Take 10 mg by mouth 4 (four) times daily as needed for pain.     . pantoprazole (PROTONIX) 40 MG tablet TAKE 1 TABLET DAILY 90 tablet 3  . SYRINGE-NEEDLE, DISP, 3 ML (B-D 3CC LUER-LOK SYR 25GX1") 25G X 1" 3 ML MISC USE TO INJECT VITAMIN B12 EVERY 30 DAYS 12 each 2  . triamcinolone cream (KENALOG) 0.5 % Apply 1 application topically 3 (three) times daily.    Marland Kitchen PEG-KCl-NaCl-NaSulf-Na Asc-C (PLENVU) 140 g SOLR Take 140 g by mouth as directed. 1 each 0   No current facility-administered medications for this visit.      ___________________________________________________________________ Objective   Exam:  BP 108/68 (BP Location: Left Arm, Patient Position: Sitting, Cuff Size: Normal)   Pulse 84   Ht 5' 6" (1.676 m)   Wt 227 lb 4 oz (103.1 kg)   LMP 05/05/1996   SpO2 97%   BMI 36.68 kg/m    General: Well-appearing  Eyes: sclera anicteric, no redness  ENT:  oral mucosa moist without lesions, no cervical or supraclavicular lymphadenopathy  CV: RRR without murmur, S1/S2, no JVD, no peripheral edema  Resp: clear to auscultation bilaterally, normal RR and effort noted  GI: soft, mild epigastric tenderness, with active bowel sounds. No guarding or palpable organomegaly noted.  Skin; warm and dry, no rash or jaundice noted  Neuro: awake, alert and oriented x 3. Normal gross motor function and fluent speech  Labs:  CBC Latest Ref Rng & Units 04/20/2019 09/09/2018 08/25/2017  WBC 4.0 - 10.5 K/uL 7.1 6.8 5.3  Hemoglobin 12.0 - 15.0 g/dL 14.5 14.6 15.4(H)  Hematocrit 36 - 46 % 42.1 43.3 44.9  Platelets 150 - 400 K/uL 252.0 218.0 225.0   CMP Latest Ref Rng & Units 04/20/2019 09/09/2018 03/18/2018  Glucose 70 - 99 mg/dL 125(H) 127(H) 192(H)  BUN 6 - 23 mg/dL _0 Creatinine 0.40 - 1.20 mg/dL 1.00 0.85 0.90  Sodium 135 - 145 mEq/L 140 141 140  Potassium 3.5 - 5.1 mEq/L 4.1 4.3 3.9  Chloride 96 - 112 mEq/L 101 103 103  CO2 19 - 32 mEq/L 33(H) 31 30  Calcium 8.4 - 10.5 mg/dL 9.8 9.2 9.4  Total Protein 6.0 - 8.3 g/dL 6.5 6.3 6.8  Total Bilirubin 0.2 - 1.2 mg/dL 0.7 0.6 0.6  Alkaline Phos 39 - 117 U/L 65 80 71  AST 0 - 37 U/L _1 ALT 0 - 35 U/L _2 Radiologic Studies:  CLINICAL DATA:  Left upper quadrant pain x3 months   EXAM: ABDOMEN ULTRASOUND COMPLETE   COMPARISON:  CT abdomen/pelvis dated 05/09/2014   FINDINGS: Gallbladder:  No gallstones, gallbladder wall thickening, or pericholecystic fluid. Negative sonographic Murphy's sign.   Common bile duct: Diameter: 3 mm   Liver: Hyperechoic hepatic parenchyma. No focal hepatic lesion is seen. Portal vein is patent on color Doppler imaging with normal direction of blood flow towards the liver.   IVC: No abnormality visualized.   Pancreas: Incompletely visualized but grossly unremarkable.   Spleen: Size and appearance within normal limits.   Right Kidney: Length:  10.7 cm. Echogenicity within normal limits. No mass or hydronephrosis visualized.   Left Kidney: Length: 11.2 cm. Echogenicity within normal limits. 14 x 15 x 15 mm left renal sinus cyst, better evaluated on prior CT. No hydronephrosis.   Abdominal aorta: No aneurysm visualized.   Other findings: None.   IMPRESSION: Hyperechoic hepatic parenchyma, suggesting hepatic steatosis.   Left renal sinus cysts.     Electronically Signed   By: Julian Hy M.D.   On: 05/05/2019 16:48   Assessment: Encounter Diagnoses  Name Primary?  . Epigastric pain Yes  . Nausea and vomiting in adult   . Therapeutic opioid-induced constipation (OIC)   . Special screening for malignant neoplasms, colon     Years of intermittent epigastric pain with nausea and vomiting.  Curious that there are long periods when it will not bother her.  For about the last year it seems to be happening more frequently though.  Perhaps a motility issue related to opioids, though even then strange that occurs intermittently.  Does not vomit up undigested food to suggest gastroparesis.  Does not sound like biliary colic or dyskinesia, no stone seen on ultrasound last year with similar symptoms. Opioid-induced constipation  Plan:  EGD and colonoscopy.  She was agreeable after discussion of procedure and risks.  The benefits and risks of the planned procedure were described in detail with the patient or (when appropriate) their health care proxy.  Risks were outlined as including, but not limited to, bleeding, infection, perforation, adverse medication reaction leading to cardiac or pulmonary decompensation, pancreatitis (if ERCP).  The limitation of incomplete mucosal visualization was also discussed.  No guarantees or warranties were given.  2-day prep due to constipation  After procedures, discuss possible trial of Movantik.  Thank you for the courtesy of this consult.  Please call me with any questions or  concerns.  Nelida Meuse III  CC: Referring provider noted above

## 2020-01-02 ENCOUNTER — Other Ambulatory Visit: Payer: Self-pay | Admitting: Family Medicine

## 2020-01-12 ENCOUNTER — Other Ambulatory Visit: Payer: Self-pay

## 2020-01-12 ENCOUNTER — Ambulatory Visit
Admission: RE | Admit: 2020-01-12 | Discharge: 2020-01-12 | Disposition: A | Discharge status: Home / Self Care | Payer: Medicare Other | Source: Ambulatory Visit | Attending: Obstetrics & Gynecology | Admitting: Obstetrics & Gynecology

## 2020-01-12 DIAGNOSIS — Z803 Family history of malignant neoplasm of breast: Secondary | ICD-10-CM

## 2020-01-12 DIAGNOSIS — Z9189 Other specified personal risk factors, not elsewhere classified: Secondary | ICD-10-CM

## 2020-01-12 DIAGNOSIS — Z800 Family history of malignant neoplasm of digestive organs: Secondary | ICD-10-CM

## 2020-01-12 DIAGNOSIS — Z8041 Family history of malignant neoplasm of ovary: Secondary | ICD-10-CM

## 2020-01-12 DIAGNOSIS — R922 Inconclusive mammogram: Secondary | ICD-10-CM | POA: Diagnosis not present

## 2020-01-12 MED ORDER — GADOBUTROL 1 MMOL/ML IV SOLN
10.0000 mL | Freq: Once | INTRAVENOUS | Status: AC | PRN
  Administered 2020-01-12: 10 mL via INTRAVENOUS

## 2020-01-20 DIAGNOSIS — G90522 Complex regional pain syndrome I of left lower limb: Secondary | ICD-10-CM | POA: Diagnosis not present

## 2020-01-20 DIAGNOSIS — M4726 Other spondylosis with radiculopathy, lumbar region: Secondary | ICD-10-CM | POA: Diagnosis not present

## 2020-01-20 DIAGNOSIS — Z79891 Long term (current) use of opiate analgesic: Secondary | ICD-10-CM | POA: Diagnosis not present

## 2020-01-20 DIAGNOSIS — G894 Chronic pain syndrome: Secondary | ICD-10-CM | POA: Diagnosis not present

## 2020-01-30 ENCOUNTER — Other Ambulatory Visit: Payer: Self-pay | Admitting: Family Medicine

## 2020-01-30 ENCOUNTER — Other Ambulatory Visit: Payer: Self-pay

## 2020-01-30 DIAGNOSIS — I10 Essential (primary) hypertension: Secondary | ICD-10-CM

## 2020-02-08 ENCOUNTER — Telehealth: Payer: Self-pay | Admitting: Family Medicine

## 2020-02-08 NOTE — Progress Notes (Signed)
  Chronic Care Management   Note  02/08/2020 Name: Haley Jenkins MRN: [id number] DOB: [birth year]  [name] is a 69 y.o. year old female who is a primary care patient of Leamon Arnt, MD. I reached out to [employer] by phone today in response to a referral sent by [name]'s PCP, Leamon Arnt, MD.   [name] was given information about Chronic Care Management services today including:  1. CCM service includes personalized support from designated clinical staff supervised by her physician, including individualized plan of care and coordination with other care providers 2. 24/7 contact phone numbers for assistance for urgent and routine care needs. 3. Service will only be billed when office clinical staff spend 20 minutes or more in a month to coordinate care. 4. Only one practitioner may furnish and bill the service in a calendar month. 5. The patient may stop CCM services at any time (effective at the end of the month) by phone call to the office staff.   Patient did not agree to enrollment in care management services and does not wish to consider at this time.  Follow up plan:   Lauretta Grill [employer] Scheduler

## 2020-02-15 ENCOUNTER — Ambulatory Visit: Payer: Medicare Other | Admitting: Family Medicine

## 2020-02-16 ENCOUNTER — Ambulatory Visit (AMBULATORY_SURGERY_CENTER): Payer: Medicare Other | Admitting: Gastroenterology

## 2020-02-16 ENCOUNTER — Other Ambulatory Visit: Payer: Self-pay

## 2020-02-16 ENCOUNTER — Telehealth: Payer: Self-pay | Admitting: Gastroenterology

## 2020-02-16 ENCOUNTER — Encounter: Payer: Self-pay | Admitting: Gastroenterology

## 2020-02-16 VITALS — BP 117/76 | HR 76 | Temp 98.6°F | Resp 24 | Ht 66.0 in | Wt 227.0 lb

## 2020-02-16 DIAGNOSIS — R112 Nausea with vomiting, unspecified: Secondary | ICD-10-CM

## 2020-02-16 DIAGNOSIS — R1013 Epigastric pain: Secondary | ICD-10-CM | POA: Diagnosis not present

## 2020-02-16 DIAGNOSIS — Z1211 Encounter for screening for malignant neoplasm of colon: Secondary | ICD-10-CM | POA: Diagnosis not present

## 2020-02-16 DIAGNOSIS — K317 Polyp of stomach and duodenum: Secondary | ICD-10-CM | POA: Diagnosis not present

## 2020-02-16 DIAGNOSIS — K5903 Drug induced constipation: Secondary | ICD-10-CM

## 2020-02-16 MED ORDER — SODIUM CHLORIDE 0.9 % IV SOLN: 500.0000 mL | Freq: Once | INTRAVENOUS | Status: DC

## 2020-02-16 NOTE — Patient Instructions (Signed)
Discharge instructions given. No path. Resume previous medications. YOU HAD AN ENDOSCOPIC PROCEDURE TODAY AT [facility]:   Refer to the procedure report that was given to you for any specific questions about what was found during the examination.  If the procedure report does not answer your questions, please call your gastroenterologist to clarify.  If you requested that your care partner not be given the details of your procedure findings, then the procedure report has been included in a sealed envelope for you to review at your convenience later.  YOU SHOULD EXPECT: Some feelings of bloating in the abdomen. Passage of more gas than usual.  Walking can help get rid of the air that was put into your GI tract during the procedure and reduce the bloating. If you had a lower endoscopy (such as a colonoscopy or flexible sigmoidoscopy) you may notice spotting of blood in your stool or on the toilet paper. If you underwent a bowel prep for your procedure, you may not have a normal bowel movement for a few days.  Please Note:  You might notice some irritation and congestion in your nose or some drainage.  This is from the oxygen used during your procedure.  There is no need for concern and it should clear up in a day or so.  SYMPTOMS TO REPORT IMMEDIATELY:   Following lower endoscopy (colonoscopy or flexible sigmoidoscopy):  Excessive amounts of blood in the stool  Significant tenderness or worsening of abdominal pains  Swelling of the abdomen that is new, acute  Fever of 100F or higher   Following upper endoscopy (EGD)  Vomiting of blood or coffee ground material  New chest pain or pain under the shoulder blades  Painful or persistently difficult swallowing  New shortness of breath  Fever of 100F or higher  Black, tarry-looking stools  For urgent or emergent issues, a gastroenterologist can be reached at any hour by calling [phone]. Do not use MyChart messaging for  urgent concerns.    DIET:  We do recommend a small meal at first, but then you may proceed to your regular diet.  Drink plenty of fluids but you should avoid alcoholic beverages for 24 hours.  ACTIVITY:  You should plan to take it easy for the rest of today and you should NOT DRIVE or use heavy machinery until tomorrow (because of the sedation medicines used during the test).    FOLLOW UP: Our staff will call the number listed on your records 48-72 hours following your procedure to check on you and address any questions or concerns that you may have regarding the information given to you following your procedure. If we do not reach you, we will leave a message.  We will attempt to reach you two times.  During this call, we will ask if you have developed any symptoms of COVID 19. If you develop any symptoms (ie: fever, flu-like symptoms, shortness of breath, cough etc.) before then, please call [phone].  If you test positive for Covid 19 in the 2 weeks post procedure, please call and report this information to [place].    If any biopsies were taken you will be contacted by phone or by letter within the next 1-3 weeks.  Please call us at [phone] if you have not heard about the biopsies in 3 weeks.    SIGNATURES/CONFIDENTIALITY: You and/or your care partner have signed paperwork which will be entered into your electronic medical record.  These signatures attest to  the fact that that the information above on your After Visit Summary has been reviewed and is understood.  Full responsibility of the confidentiality of this discharge information lies with you and/or your care-partner.

## 2020-02-16 NOTE — Progress Notes (Signed)
1351 Robinul 0.1 mg IV given due large amount of secretions upon assessment.  MD made aware, vss 

## 2020-02-16 NOTE — Telephone Encounter (Signed)
I saw this patient for upper endoscopy and colonoscopy today.  After the procedure, I had a discussion with her and her husband about a trial of Movantik for her opioid-induced constipation.  She was agreeable, so I would like to send a prescription for a 14-day course to see if it agrees with her.  It may cause diarrhea in the first couple of days but that should settle out.  If she has any concerns while taking it, we would certainly want to hear from her immediately.  I do not know which pharmacy she would prefer, so please check with her and then send a prescription for  Movantik 25 mg, 1 tablet every morning.  Dispense 14 tablets, 0 refill  Then I would like her to call us at the end of that 14-day period (or sooner if needed) with an update on how that is working so we can decide how to proceed.

## 2020-02-16 NOTE — Op Note (Signed)
[facility] Patient Name: Haley Jenkins Procedure Date: 02/16/2020 1:51 PM MRN: [id number] Endoscopist: Mallie Mussel L. Loletha Carrow , MD Age: 68 Referring MD:  Date of Birth: [birth year] Gender: Female Account #: [id number] Procedure:                Colonoscopy Indications:              Screening for colorectal malignant neoplasm Medicines:                Monitored Anesthesia Care Procedure:                Pre-Anesthesia Assessment:                           - Prior to the procedure, a History and Physical                            was performed, and patient medications and                            allergies were reviewed. The patient's tolerance of                            previous anesthesia was also reviewed. The risks                            and benefits of the procedure and the sedation                            options and risks were discussed with the patient.                            All questions were answered, and informed consent                            was obtained. Prior Anticoagulants: The patient has                            taken no previous anticoagulant or antiplatelet                            agents. ASA Grade Assessment: II - A patient with                            mild systemic disease. After reviewing the risks                            and benefits, the patient was deemed in                            satisfactory condition to undergo the procedure.                           After obtaining informed consent, the colonoscope  was passed under direct vision. Throughout the                            procedure, the patient's blood pressure, pulse, and                            oxygen saturations were monitored continuously. The                            Colonoscope was introduced through the anus and                            advanced to the the cecum, identified by                            appendiceal orifice and  ileocecal valve. The                            colonoscopy was performed with difficulty due to a                            redundant colon and significant looping. Successful                            completion of the procedure was aided by using                            manual pressure. The patient tolerated the                            procedure well. The quality of the bowel                            preparation was good. The ileocecal valve,                            appendiceal orifice, and rectum were photographed.                            The bowel preparation used was 2 day Suprep/Miralax. Scope In: 1:56:10 PM Scope Out: 2:16:13 PM Scope Withdrawal Time: 0 hours 11 minutes 23 seconds  Total Procedure Duration: 0 hours 20 minutes 3 seconds  Findings:                 The perianal and digital rectal examinations were                            normal.                           The colon (entire examined portion) was redundant.                           Multiple small-mouthed diverticula were found in  the sigmoid colon.                           The exam was otherwise without abnormality on                            direct and retroflexion views. Complications:            No immediate complications. Estimated Blood Loss:     Estimated blood loss: none. Impression:               - Redundant colon.                           - Diverticulosis in the sigmoid colon.                           - The examination was otherwise normal on direct                            and retroflexion views.                           - No specimens collected. Recommendation:           - Patient has a contact number available for                            emergencies. The signs and symptoms of potential                            delayed complications were discussed with the                            patient. Return to normal activities tomorrow.                             Written discharge instructions were provided to the                            patient.                           - Resume previous diet.                           - Continue present medications.                           - Repeat colonoscopy in 10 years for screening                            purposes (assess patient's health at that time and                            decide if colonoscopy appropriate).                           -  See the other procedure note for documentation of                            additional recommendations. Teren Zurcher L. Loletha Carrow, MD 02/16/2020 2:34:05 PM This report has been signed electronically.

## 2020-02-16 NOTE — Progress Notes (Signed)
Report given to PACU, vss 

## 2020-02-16 NOTE — Op Note (Signed)
[facility] Patient Name: Haley Jenkins Procedure Date: 02/16/2020 1:51 PM MRN: [id number] Endoscopist: Mallie Mussel L. Loletha Carrow , MD Age: 69 Referring MD:  Date of Birth: 1951-03-20 Gender: Female Account #: [id number] Procedure:                Upper GI endoscopy Indications:              Esophageal reflux symptoms that persist despite                            appropriate therapy, , Nausea with vomiting                            (episodic for years) Medicines:                Monitored Anesthesia Care Procedure:                Pre-Anesthesia Assessment:                           - Prior to the procedure, a History and Physical                            was performed, and patient medications and                            allergies were reviewed. The patient's tolerance of                            previous anesthesia was also reviewed. The risks                            and benefits of the procedure and the sedation                            options and risks were discussed with the patient.                            All questions were answered, and informed consent                            was obtained. Prior Anticoagulants: The patient has                            taken no previous anticoagulant or antiplatelet                            agents. ASA Grade Assessment: II - A patient with                            mild systemic disease. After reviewing the risks                            and benefits, the patient was deemed in  satisfactory condition to undergo the procedure.                           After obtaining informed consent, the endoscope was                            passed under direct vision. Throughout the                            procedure, the patient's blood pressure, pulse, and                            oxygen saturations were monitored continuously. The                            Endoscope was introduced through the  mouth, and                            advanced to the second part of duodenum. The                            Endoscope was introduced through the and advanced                            to the. The upper GI endoscopy was accomplished                            without difficulty. The patient tolerated the                            procedure well. Scope In: Scope Out: Findings:                 The esophagus was normal.                           Retained fluid was found in the gastric fundus and                            in the gastric body. It was completely aspirated.                            No gastric motility was seen during the exam.                           Multiple large pedunculated and sessile fundic                            gland polyps were found in the gastric fundus and                            in the gastric body.                           The exam of the stomach  was otherwise normal. The                            pylorus was widely patent with a health appearance.                           The examined duodenum was normal. Complications:            No immediate complications. Estimated Blood Loss:     Estimated blood loss: none. Impression:               - Normal esophagus.                           - Retained gastric fluid.                           - Multiple fundic gland polyps.                           - Normal examined duodenum.                           - No specimens collected. Recommendation:           - Patient has a contact number available for                            emergencies. The signs and symptoms of potential                            delayed complications were discussed with the                            patient. Return to normal activities tomorrow.                            Written discharge instructions were provided to the                            patient.                           - Resume previous diet.                            - Continue present medications.                           - See the other procedure note for documentation of                            additional recommendations.                           - Symptoms and EGD findings suggest gastroparesis,  which appears most likely opioid-induced since the                            patient's diabetes has been under relatively good                            control for many years.                           Will discuss with patient a trial of Movantik                            because she has opioid-induced constipation as well. Neyda Durango L. Loletha Carrow, MD 02/16/2020 2:41:37 PM This report has been signed electronically.

## 2020-02-17 ENCOUNTER — Encounter: Payer: Self-pay | Admitting: Family Medicine

## 2020-02-17 DIAGNOSIS — E039 Hypothyroidism, unspecified: Secondary | ICD-10-CM | POA: Diagnosis not present

## 2020-02-17 DIAGNOSIS — K219 Gastro-esophageal reflux disease without esophagitis: Secondary | ICD-10-CM | POA: Diagnosis not present

## 2020-02-17 DIAGNOSIS — R1110 Vomiting, unspecified: Secondary | ICD-10-CM | POA: Diagnosis not present

## 2020-02-17 DIAGNOSIS — R1013 Epigastric pain: Secondary | ICD-10-CM | POA: Diagnosis not present

## 2020-02-17 DIAGNOSIS — Z1211 Encounter for screening for malignant neoplasm of colon: Secondary | ICD-10-CM | POA: Diagnosis not present

## 2020-02-20 ENCOUNTER — Telehealth: Payer: Self-pay

## 2020-02-20 MED ORDER — NALOXEGOL OXALATE 25 MG PO TABS: 25.0000 mg | ORAL_TABLET | Freq: Every morning | ORAL | 0 refills | Status: AC

## 2020-02-20 NOTE — Telephone Encounter (Signed)
  Follow up Call-  Call back number 02/16/2020  Post procedure Call Back phone  # [phone]  Permission to leave phone message Yes  Some recent data might be hidden     Patient questions:  Do you have a fever, pain , or abdominal swelling? No. Pain Score  0 *  Have you tolerated food without any problems? Yes.    Have you been able to return to your normal activities? Yes.    Do you have any questions about your discharge instructions: Diet   No. Medications  No. Follow up visit  No.  Do you have questions or concerns about your Care? No.  Actions: * If pain score is 4 or above: No action needed, pain <4.  1. Have you developed a fever since your procedure? no  2.   Have you had an respiratory symptoms (SOB or cough) since your procedure? no  3.   Have you tested positive for COVID 19 since your procedure no  4.   Have you had any family members/close contacts diagnosed with the COVID 19 since your procedure?  no   If yes to any of these questions please route to Joylene John, RN and Joella Prince, RN

## 2020-02-20 NOTE — Telephone Encounter (Signed)
Spoke with patient regarding below. Pt states that she would like prescription sent to [facility]. Advised patient that she may have diarrhea within the first couple of days but should settle out on its own. Advised her to give [place] a call at the end of the 14-day period with an update or to give [place] a call sooner if she had any concerns prior to that. Pt requested a follow up with Dr. Loletha Carrow to discuss EGD/colon results and next steps. Patient is scheduled for a follow up on 03/15/20 at 9:20 AM. Pt verbalized understanding and has no other concerns at this time.  Prescription sent to pharmacy.

## 2020-02-27 DIAGNOSIS — Z23 Encounter for immunization: Secondary | ICD-10-CM | POA: Diagnosis not present

## 2020-03-06 ENCOUNTER — Ambulatory Visit: Payer: Medicare Other | Admitting: Obstetrics & Gynecology

## 2020-03-13 ENCOUNTER — Ambulatory Visit: Payer: Medicare Other | Admitting: Obstetrics & Gynecology

## 2020-03-15 ENCOUNTER — Ambulatory Visit (INDEPENDENT_AMBULATORY_CARE_PROVIDER_SITE_OTHER): Payer: Medicare Other | Admitting: Gastroenterology

## 2020-03-15 ENCOUNTER — Other Ambulatory Visit (INDEPENDENT_AMBULATORY_CARE_PROVIDER_SITE_OTHER): Payer: Medicare Other

## 2020-03-15 ENCOUNTER — Encounter: Payer: Self-pay | Admitting: Gastroenterology

## 2020-03-15 VITALS — BP 118/60 | HR 60 | Ht 66.0 in | Wt 227.0 lb

## 2020-03-15 DIAGNOSIS — K3184 Gastroparesis: Secondary | ICD-10-CM

## 2020-03-15 DIAGNOSIS — R140 Abdominal distension (gaseous): Secondary | ICD-10-CM | POA: Diagnosis not present

## 2020-03-15 DIAGNOSIS — E119 Type 2 diabetes mellitus without complications: Secondary | ICD-10-CM | POA: Diagnosis not present

## 2020-03-15 DIAGNOSIS — K5903 Drug induced constipation: Secondary | ICD-10-CM

## 2020-03-15 DIAGNOSIS — R112 Nausea with vomiting, unspecified: Secondary | ICD-10-CM

## 2020-03-15 DIAGNOSIS — T402X5A Adverse effect of other opioids, initial encounter: Secondary | ICD-10-CM

## 2020-03-15 LAB — HEMOGLOBIN A1C: Hgb A1c MFr Bld: 6.9 % — ABNORMAL HIGH (ref 4.6–6.5)

## 2020-03-15 MED ORDER — PROMETHAZINE HCL 25 MG RE SUPP: 25.0000 mg | Freq: Four times a day (QID) | RECTAL | 2 refills | Status: AC | PRN

## 2020-03-15 NOTE — Progress Notes (Signed)
[facility] Progress Note  Chief Complaint: Nausea and vomiting  Subjective  History: Seen in office consult 12/22/2019 with many years of episodic nausea and vomiting, chronic abdominal pain and constipation on opioids. Diabetes under good chronic control with last hemoglobin A1c 5.09 April 2019.  EGD and colonoscopy 02/16/2020.  Screening colonoscopy with 2-day preparation (due to opioid-induced constipation) complete exam to cecum, redundant colon with scope looping.  Good quality exam, no polyps seen. EGD had a large amount of retained fluid but no food in the stomach.  No gastric motility was witnessed during the exam. I recommended contacting primary care regarding management of the diabetes and check of hemoglobin A1c, which she did. Movantik was also started.  Zerah followed up for her symptoms today, she had a number of questions and concerns along with an update on how she is feeling.  Movantik did not help bowels move more frequently, and it was expensive so she decided to stop taking it. She has made significant dietary changes after doing some research on gastroparesis.  Having smaller portions and try not to eat in the evening.  About 2 weeks ago she had some peanuts and something to drink less than 30 minutes before going to bed, then woke up vomiting and had protracted symptoms into the next day.  She recalled that many years ago Dr. Sharlett Iles had prescribed Phenergan suppository for these occurrences, and she had found them helpful and wished she had had 1 with the recent episode.   ROS: Cardiovascular:  no chest pain Respiratory: no dyspnea Chronic musculoskeletal pain.  She recently tried to decrease her Opana dose after these endoscopy findings and will follow up with her pain clinic about that. She has been on the med many years and knows that she cannot completely stop it.  The patient's Past Medical, Family and Social History were reviewed and are on  file in the EMR.  Objective:  Med list reviewed  Current Outpatient Medications:  .  aspirin 81 MG tablet, Take 81 mg by mouth daily. , Disp: , Rfl:  .  atorvastatin (LIPITOR) 20 MG tablet, TAKE 1 TABLET DAILY, Disp: 60 tablet, Rfl: 0 .  Calcium-Magnesium-Vitamin D (CALCIUM MAGNESIUM PO), Take 1,500 mg by mouth daily. , Disp: , Rfl:  .  Cholecalciferol (VITAMIN D-3) 5000 units TABS, Take 5,000 Units by mouth daily., Disp: , Rfl:  .  Eszopiclone (ESZOPICLONE) 3 MG TABS, Take 3 mg by mouth at bedtime. Take immediately before bedtime , Disp: , Rfl:  .  levothyroxine (SYNTHROID) 50 MCG tablet, Take 1 tablet (50 mcg total) by mouth daily., Disp: 90 tablet, Rfl: 4 .  lisinopril-hydrochlorothiazide (ZESTORETIC) 20-25 MG tablet, TAKE 1 TABLET DAILY, Disp: 30 tablet, Rfl: 0 .  oxymorphone (OPANA) 10 MG tablet, Take 10 mg by mouth 4 (four) times daily as needed for pain. , Disp: , Rfl:  .  pantoprazole (PROTONIX) 40 MG tablet, TAKE 1 TABLET DAILY, Disp: 90 tablet, Rfl: 3 .  SYRINGE-NEEDLE, DISP, 3 ML (B-D 3CC LUER-LOK SYR 25GX1") 25G X 1" 3 ML MISC, USE TO INJECT VITAMIN B12 EVERY 30 DAYS, Disp: 12 each, Rfl: 2 .  triamcinolone cream (KENALOG) 0.5 %, Apply 1 application topically 3 (three) times daily. , Disp: , Rfl:  .  cyanocobalamin (,VITAMIN B-12,) 1000 MCG/ML injection, INJECT 1 ML INTRAMUSCULARLY ONCE EVERY MONTH (Patient not taking: Reported on 02/16/2020), Disp: 3 mL, Rfl: 0 .  Exenatide ER (BYDUREON BCISE) 2 MG/0.85ML AUIJ, Inject 2 mg  into the skin once a week. (Patient not taking: Reported on 03/15/2020), Disp: 4 pen, Rfl: 11 .  fluticasone (FLONASE) 50 MCG/ACT nasal spray, PLACE TWO SPRAYS INTO THE NOSE DAILY (Patient not taking: Reported on 02/16/2020), Disp: 16 g, Rfl: 5 .  promethazine (PHENERGAN) 25 MG suppository, Place 1 suppository (25 mg total) rectally every 6 (six) hours as needed for nausea or vomiting., Disp: 12 each, Rfl: 2   Vital signs in last 24 hrs: Vitals:   03/15/20  0925  BP: 118/60  Pulse: 60  SpO2: 97%   No exam.  Entire visit spent in discussion.    Labs:   ___________________________________________ Radiologic studies:   ____________________________________________ Other:   _____________________________________________ Assessment & Plan  Assessment: Encounter Diagnoses  Name Primary?  . Gastroparesis Yes  . Nausea and vomiting in adult   . Therapeutic opioid-induced constipation (OIC)   . Controlled type 2 diabetes mellitus without complication, without long-term current use of insulin ([place])   . Abdominal bloating    Gastroparesis that I believe is opioid related.  She is much improved after some dietary changes she made from her own research.  I reviewed need for low-fat diet and to cook vegetables to breakdown fiber prior to eating. I would not advocate metoclopramide at this point given the improvement in symptoms and the risks of that medication. She asked about the prognosis, and I suspect the problem is not likely to become more severe over time since her dose of opioids has been stable, but it is difficult to know that for certain. She was very concerned about checking up on the diabetes and wanted to do a hemoglobin A1c prior to her primary care visit.  Since that was supposed to be soon but got pushed back to February, I sent her to the lab for that today, and will copy results to primary care.  She takes a stool softener and as needed use of MiraLAX, which I expect she will need to continue since the [place] did not help relieve the constipation.  I agree with not continuing it given the lack of results and its expense. Difficult to say whether it reduced the nausea and vomiting, since that was episodic in the first place and lately she has made some dietary changes.   Plan: All concerns and questions were addressed to her satisfaction. Prescription sent for Phenergan suppository 25 mg, 1 every 6 hours as needed for  episodes of nausea and vomiting.  I would be glad to see her as needed.  22 minutes were spent on this encounter (including chart review, history/exam, counseling/coordination of care, and documentation)  Nelida Meuse III

## 2020-03-15 NOTE — Patient Instructions (Addendum)
If you are age 67 or older, your body mass index should be between 23-30. Your Body mass index is 36.64 kg/m. If this is out of the aforementioned range listed, please consider follow up with your Primary Care Provider.  If you are age 20 or younger, your body mass index should be between 19-25. Your Body mass index is 36.64 kg/m. If this is out of the aformentioned range listed, please consider follow up with your Primary Care Provider.   Your provider has requested that you go to the basement level for lab work before leaving today. Press "B" on the elevator. The lab is located at the first door on the left as you exit the elevator.  Due to recent changes in healthcare laws, you may see the results of your imaging and laboratory studies on MyChart before your provider has had a chance to review them.  We understand that in some cases there may be results that are confusing or concerning to you. Not all laboratory results come back in the same time frame and the provider may be waiting for multiple results in order to interpret others.  Please give [place] 48 hours in order for your provider to thoroughly review all the results before contacting the office for clarification of your results.    It was a pleasure to see you today!  Dr. Loletha Carrow

## 2020-03-19 ENCOUNTER — Ambulatory Visit: Payer: Medicare Other | Admitting: Family Medicine

## 2020-03-22 DIAGNOSIS — M180 Bilateral primary osteoarthritis of first carpometacarpal joints: Secondary | ICD-10-CM | POA: Diagnosis not present

## 2020-03-22 DIAGNOSIS — M65312 Trigger thumb, left thumb: Secondary | ICD-10-CM | POA: Diagnosis not present

## 2020-03-22 DIAGNOSIS — M25522 Pain in left elbow: Secondary | ICD-10-CM | POA: Diagnosis not present

## 2020-04-04 ENCOUNTER — Other Ambulatory Visit: Payer: Self-pay | Admitting: Obstetrics & Gynecology

## 2020-04-04 ENCOUNTER — Other Ambulatory Visit: Payer: Self-pay | Admitting: Family Medicine

## 2020-04-05 DIAGNOSIS — M79642 Pain in left hand: Secondary | ICD-10-CM | POA: Diagnosis not present

## 2020-04-05 DIAGNOSIS — M79641 Pain in right hand: Secondary | ICD-10-CM | POA: Diagnosis not present

## 2020-04-05 DIAGNOSIS — M65311 Trigger thumb, right thumb: Secondary | ICD-10-CM | POA: Diagnosis not present

## 2020-04-12 ENCOUNTER — Telehealth: Payer: Self-pay | Admitting: Obstetrics & Gynecology

## 2020-04-12 ENCOUNTER — Other Ambulatory Visit: Payer: Self-pay | Admitting: Obstetrics & Gynecology

## 2020-04-12 MED ORDER — LEVOTHYROXINE SODIUM 50 MCG PO TABS
50.0000 ug | ORAL_TABLET | Freq: Every day | ORAL | 0 refills | Status: DC
Start: 2020-04-12 — End: 2020-07-09

## 2020-04-12 NOTE — Telephone Encounter (Signed)
Pt called office requesting rx refill for LEOTHYROXINE tablets be sent to [facility], [place]

## 2020-04-20 DIAGNOSIS — G894 Chronic pain syndrome: Secondary | ICD-10-CM | POA: Diagnosis not present

## 2020-04-20 DIAGNOSIS — M4726 Other spondylosis with radiculopathy, lumbar region: Secondary | ICD-10-CM | POA: Diagnosis not present

## 2020-04-20 DIAGNOSIS — G90522 Complex regional pain syndrome I of left lower limb: Secondary | ICD-10-CM | POA: Diagnosis not present

## 2020-04-20 DIAGNOSIS — Z79891 Long term (current) use of opiate analgesic: Secondary | ICD-10-CM | POA: Diagnosis not present

## 2020-04-24 DIAGNOSIS — L570 Actinic keratosis: Secondary | ICD-10-CM | POA: Diagnosis not present

## 2020-04-24 DIAGNOSIS — Z85828 Personal history of other malignant neoplasm of skin: Secondary | ICD-10-CM | POA: Diagnosis not present

## 2020-04-24 DIAGNOSIS — L723 Sebaceous cyst: Secondary | ICD-10-CM | POA: Diagnosis not present

## 2020-05-02 ENCOUNTER — Ambulatory Visit (INDEPENDENT_AMBULATORY_CARE_PROVIDER_SITE_OTHER): Payer: Medicare Other | Admitting: Obstetrics & Gynecology

## 2020-05-02 ENCOUNTER — Encounter: Payer: Self-pay | Admitting: Obstetrics & Gynecology

## 2020-05-02 ENCOUNTER — Other Ambulatory Visit: Payer: Self-pay

## 2020-05-02 VITALS — BP 114/62 | HR 75 | Resp 16 | Ht 66.0 in | Wt 220.0 lb

## 2020-05-02 DIAGNOSIS — Z9189 Other specified personal risk factors, not elsewhere classified: Secondary | ICD-10-CM | POA: Diagnosis not present

## 2020-05-02 DIAGNOSIS — I10 Essential (primary) hypertension: Secondary | ICD-10-CM | POA: Diagnosis not present

## 2020-05-02 DIAGNOSIS — Z01419 Encounter for gynecological examination (general) (routine) without abnormal findings: Secondary | ICD-10-CM

## 2020-05-02 DIAGNOSIS — Z803 Family history of malignant neoplasm of breast: Secondary | ICD-10-CM | POA: Diagnosis not present

## 2020-05-02 DIAGNOSIS — E039 Hypothyroidism, unspecified: Secondary | ICD-10-CM | POA: Diagnosis not present

## 2020-05-02 DIAGNOSIS — Z8041 Family history of malignant neoplasm of ovary: Secondary | ICD-10-CM | POA: Diagnosis not present

## 2020-05-02 DIAGNOSIS — E119 Type 2 diabetes mellitus without complications: Secondary | ICD-10-CM

## 2020-05-02 NOTE — Patient Instructions (Signed)
[facility] [address] [place], [place] [address]  [place]: [phone] Behavioral Medicine: [phone] Fax: [phone] Hours: 7am - 5pm (M, Th, F); 7am - 6pm (Tue & Wed) Nature conservation officer at [employer]

## 2020-05-02 NOTE — Progress Notes (Signed)
69 y.o. [id number] Married White or Caucasian female here for breast and pelvic exam due to increased lifetime risk of breast cancer.  MMG was 07/2019 and breast MRI 01/2020.  High risk breast clinic discussed for possible tamoxifen risk reduction use.  Had significant issues with emesis and reflux.  Saw Dr. Loletha Carrow.  Endoscopy was done.  Diagnosed with slow motility.  Dietary changes have helped.  No longer having emesis.  Denies vaginal bleeding.  Mother passed in October.  She was 87.    PCP:  Dr. Jonni Sanger  Health Maintenance: Pap:  01/2019 History of abnormal Pap:  no MMG:  07/2019 Colonoscopy:  02/2020 BMD:   2019   reports that she has never smoked. She has never used smokeless tobacco. She reports that she does not drink alcohol and does not use drugs.  Past Medical History:  Diagnosis Date  . Allergy   . Arthritis   . BRCA negative 2/13   1, 2 negative-BART negative 09/10/11  . Carpal tunnel syndrome   . Cleft palate    repaired at birth  . Family history of breast cancer   . Family history of colon cancer   . Family history of ovarian cancer   . Fracture of knee region 10/2014   Bilateral   . Gastric polyp   . GERD (gastroesophageal reflux disease)   . Hepatic steatosis 05/09/2019   By ultrasound 04/2019  . Hiatal hernia   . Hypothyroidism   . Irritable bowel syndrome   . Kidney stone   . Lactose intolerance   . Nephrolithiasis 2015  . RSD (reflex sympathetic dystrophy)     Past Surgical History:  Procedure Laterality Date  . BREAST BIOPSY Left   . BREAST EXCISIONAL BIOPSY Right   . breast surgery cluster cyst removed     x 2  . CARPAL TUNNEL RELEASE Left 2019  . cleft palate repaired  birth, 16   at [facility]  . COLONOSCOPY    . GANGLION CYST EXCISION Right 11/04/2016  . [place], 2003  . KNEE ARTHROSCOPY Bilateral 11/2014   [facility]  . KNEE SURGERY Left 1976  . TUBAL LIGATION  1987  . URETHRAL SLING  11/13   mid-urethral sling     Current Outpatient Medications  Medication Sig Dispense Refill  . aspirin 81 MG tablet Take 81 mg by mouth daily.     Marland Kitchen atorvastatin (LIPITOR) 20 MG tablet TAKE 1 TABLET DAILY 90 tablet 0  . Calcium-Magnesium-Vitamin D (CALCIUM MAGNESIUM PO) Take 1,500 mg by mouth daily.     . Cholecalciferol (VITAMIN D-3) 5000 units TABS Take 5,000 Units by mouth daily.    . cyanocobalamin (,VITAMIN B-12,) 1000 MCG/ML injection INJECT 1 ML INTRAMUSCULARLY ONCE EVERY MONTH 3 mL 0  . diclofenac Sodium (VOLTAREN) 1 % GEL Voltaren Arthritis Pain 1 % topical gel  APPLY 2 GRAMS TO THE AFFECTED AREA(S) BY TOPICAL ROUTE 4 TIMES PER DAY    . Eszopiclone 3 MG TABS Take 3 mg by mouth at bedtime. Take immediately before bedtime    . levothyroxine (SYNTHROID) 50 MCG tablet Take 1 tablet (50 mcg total) by mouth daily. 90 tablet 0  . lisinopril-hydrochlorothiazide (ZESTORETIC) 20-25 MG tablet TAKE 1 TABLET DAILY 30 tablet 0  . oxymorphone (OPANA) 10 MG tablet Take 10 mg by mouth 4 (four) times daily as needed for pain.     . pantoprazole (PROTONIX) 40 MG tablet TAKE 1 TABLET DAILY 90 tablet 3  .  promethazine (PHENERGAN) 25 MG suppository Place 1 suppository (25 mg total) rectally every 6 (six) hours as needed for nausea or vomiting. 12 each 2  . SYRINGE-NEEDLE, DISP, 3 ML (B-D 3CC LUER-LOK SYR 25GX1") 25G X 1" 3 ML MISC USE TO INJECT VITAMIN B12 EVERY 30 DAYS 12 each 2  . triamcinolone cream (KENALOG) 0.5 % Apply 1 application topically 3 (three) times daily.     . fluticasone (FLONASE) 50 MCG/ACT nasal spray PLACE TWO SPRAYS INTO THE NOSE DAILY (Patient not taking: No sig reported) 16 g 5   No current facility-administered medications for this visit.    Family History  Problem Relation Age of Onset  . Kidney disease Mother   . Osteoporosis Mother   . Heart attack Mother        pacemaker  . Other Mother 78       meningioma-removed on 05/26/13  . Ovarian cancer Mother 45  . Brain cancer Mother 68        meningioma  . COPD Father   . Lung cancer Father 49  . Breast cancer Sister 22       40 at diagnosis.  Recurrence 2017 at 68 yo.  . Irritable bowel syndrome Daughter   . Heart disease Maternal Grandfather   . Breast cancer Other        MGM's two sisters  . Leukemia Maternal Grandmother   . Breast cancer Maternal Aunt 60  . Brain cancer Maternal Uncle   . Colon cancer Paternal Aunt   . Stroke Paternal Grandfather   . Colon cancer Sister 36  . Breast cancer Maternal Aunt   . Colon cancer Maternal Aunt   . Lymphoma Maternal Aunt   . Breast cancer Maternal Aunt   . Brain cancer Maternal Aunt   . Lung cancer Maternal Uncle        smoker  . Colon cancer Maternal Uncle   . Breast cancer Other        MGM's mother  . Breast cancer Cousin 18       maternal first cousin  . Esophageal cancer Neg Hx   . Rectal cancer Neg Hx   . Stomach cancer Neg Hx     Review of Systems  All other systems reviewed and are negative.   Exam:   BP 114/62   Pulse 75   Resp 16   Ht 5' 6" (1.676 m)   Wt 220 lb (99.8 kg)   LMP 05/05/1996   BMI 35.51 kg/m   Height: 5' 6" (167.6 cm)  General appearance: alert, cooperative and appears stated age Head: Normocephalic, without obvious abnormality, atraumatic Breasts: normal appearance, no masses or tenderness Abdomen: soft, non-tender; bowel sounds normal; no masses,  no organomegaly Extremities: extremities normal, atraumatic, no cyanosis or edema Skin: Skin color, texture, turgor normal. No rashes or lesions Lymph nodes: Cervical, supraclavicular, and axillary nodes normal. No abnormal inguinal nodes palpated Neurologic: Grossly normal   Pelvic: External genitalia:  no lesions              Urethra:  normal appearing urethra with no masses, tenderness or lesions              Bartholins and Skenes: normal                 Vagina: normal appearing vagina with normal color and discharge, no lesions              Cervix: no lesions  Pap  taken: No. Bimanual Exam:  Uterus:  normal size, contour, position, consistency, mobility, non-tender              Adnexa: normal adnexa and no mass, fullness, tenderness               Rectovaginal: Confirms               Anus:  normal sphincter tone, no lesions  Chaperone, Sherryle Lis, RN, CMA, was present for exam.  Assessment/Plan: 1. Encounter for gynecological examination without abnormal finding -pap neg 2020.  Shared decision making occurred today about pap smear testing in the future.  Guidelines reviewed.  She desires to continue pap smears until age 44.  2. Increased risk of breast cancer -Doing yearly MMG and MRI, alternating every 6 months.  3. Family history of ovarian cancer  4. Family history of breast cancer  5. Controlled type 2 diabetes mellitus without complication, without long-term current use of insulin ([place]) - followed by Dr. Jonni Sanger  6. Acquired hypothyroidism - followed by Dr. Jonni Sanger  7. Essential hypertension - followed by Dr. Jonni Sanger  32 minutes of total time was spent for this patient encounter, including preparation, face-to-face counseling with the patient and coordination of care, and documentation of the encounter.

## 2020-05-09 ENCOUNTER — Other Ambulatory Visit: Payer: Self-pay | Admitting: Family Medicine

## 2020-05-09 DIAGNOSIS — I10 Essential (primary) hypertension: Secondary | ICD-10-CM

## 2020-05-17 DIAGNOSIS — D485 Neoplasm of uncertain behavior of skin: Secondary | ICD-10-CM | POA: Diagnosis not present

## 2020-05-17 DIAGNOSIS — L720 Epidermal cyst: Secondary | ICD-10-CM | POA: Diagnosis not present

## 2020-06-06 ENCOUNTER — Encounter: Payer: Medicare Other | Admitting: Family Medicine

## 2020-06-06 DIAGNOSIS — Z0289 Encounter for other administrative examinations: Secondary | ICD-10-CM

## 2020-06-15 ENCOUNTER — Encounter: Payer: Self-pay | Admitting: Family Medicine

## 2020-07-04 ENCOUNTER — Other Ambulatory Visit: Payer: Self-pay | Admitting: Family Medicine

## 2020-07-09 ENCOUNTER — Other Ambulatory Visit: Payer: Self-pay | Admitting: Obstetrics & Gynecology

## 2020-07-09 ENCOUNTER — Other Ambulatory Visit: Payer: Self-pay | Admitting: Family Medicine

## 2020-07-10 MED ORDER — ATORVASTATIN CALCIUM 20 MG PO TABS
20.0000 mg | ORAL_TABLET | Freq: Every day | ORAL | 0 refills | Status: DC
Start: 2020-07-10 — End: 2021-05-27

## 2020-07-10 NOTE — Addendum Note (Signed)
Addended by: Billey Chang on: 07/10/2020 10:19 AM   Modules accepted: Orders

## 2020-07-13 DIAGNOSIS — M4726 Other spondylosis with radiculopathy, lumbar region: Secondary | ICD-10-CM | POA: Diagnosis not present

## 2020-07-13 DIAGNOSIS — Z79891 Long term (current) use of opiate analgesic: Secondary | ICD-10-CM | POA: Diagnosis not present

## 2020-07-13 DIAGNOSIS — G894 Chronic pain syndrome: Secondary | ICD-10-CM | POA: Diagnosis not present

## 2020-07-13 DIAGNOSIS — G90522 Complex regional pain syndrome I of left lower limb: Secondary | ICD-10-CM | POA: Diagnosis not present

## 2020-07-20 DIAGNOSIS — Z13828 Encounter for screening for other musculoskeletal disorder: Secondary | ICD-10-CM | POA: Diagnosis not present

## 2020-07-20 DIAGNOSIS — I10 Essential (primary) hypertension: Secondary | ICD-10-CM | POA: Diagnosis not present

## 2020-07-20 DIAGNOSIS — E1169 Type 2 diabetes mellitus with other specified complication: Secondary | ICD-10-CM | POA: Diagnosis not present

## 2020-07-20 DIAGNOSIS — E538 Deficiency of other specified B group vitamins: Secondary | ICD-10-CM | POA: Diagnosis not present

## 2020-07-20 DIAGNOSIS — E039 Hypothyroidism, unspecified: Secondary | ICD-10-CM | POA: Diagnosis not present

## 2020-07-20 DIAGNOSIS — N200 Calculus of kidney: Secondary | ICD-10-CM | POA: Diagnosis not present

## 2020-07-20 DIAGNOSIS — Z0000 Encounter for general adult medical examination without abnormal findings: Secondary | ICD-10-CM | POA: Diagnosis not present

## 2020-07-20 DIAGNOSIS — K5900 Constipation, unspecified: Secondary | ICD-10-CM | POA: Diagnosis not present

## 2020-07-20 DIAGNOSIS — K589 Irritable bowel syndrome without diarrhea: Secondary | ICD-10-CM | POA: Diagnosis not present

## 2020-07-20 DIAGNOSIS — Z1389 Encounter for screening for other disorder: Secondary | ICD-10-CM | POA: Diagnosis not present

## 2020-07-20 DIAGNOSIS — Z741 Need for assistance with personal care: Secondary | ICD-10-CM | POA: Diagnosis not present

## 2020-09-07 DIAGNOSIS — M4726 Other spondylosis with radiculopathy, lumbar region: Secondary | ICD-10-CM | POA: Diagnosis not present

## 2020-09-07 DIAGNOSIS — G90522 Complex regional pain syndrome I of left lower limb: Secondary | ICD-10-CM | POA: Diagnosis not present

## 2020-09-07 DIAGNOSIS — G894 Chronic pain syndrome: Secondary | ICD-10-CM | POA: Diagnosis not present

## 2020-09-07 DIAGNOSIS — Z79891 Long term (current) use of opiate analgesic: Secondary | ICD-10-CM | POA: Diagnosis not present

## 2020-09-14 ENCOUNTER — Other Ambulatory Visit: Payer: Self-pay | Admitting: Physical Medicine and Rehabilitation

## 2020-09-14 DIAGNOSIS — Z1231 Encounter for screening mammogram for malignant neoplasm of breast: Secondary | ICD-10-CM

## 2020-10-08 ENCOUNTER — Other Ambulatory Visit (HOSPITAL_BASED_OUTPATIENT_CLINIC_OR_DEPARTMENT_OTHER): Payer: Self-pay | Admitting: Obstetrics & Gynecology

## 2020-10-08 MED ORDER — LEVOTHYROXINE SODIUM 50 MCG PO TABS
50.0000 ug | ORAL_TABLET | Freq: Every day | ORAL | 3 refills | Status: DC
Start: 2020-10-08 — End: 2021-09-18

## 2020-10-16 DIAGNOSIS — Z1389 Encounter for screening for other disorder: Secondary | ICD-10-CM | POA: Diagnosis not present

## 2020-10-16 DIAGNOSIS — Z23 Encounter for immunization: Secondary | ICD-10-CM | POA: Diagnosis not present

## 2020-10-16 DIAGNOSIS — K219 Gastro-esophageal reflux disease without esophagitis: Secondary | ICD-10-CM | POA: Diagnosis not present

## 2020-10-16 DIAGNOSIS — E739 Lactose intolerance, unspecified: Secondary | ICD-10-CM | POA: Diagnosis not present

## 2020-10-16 DIAGNOSIS — E1169 Type 2 diabetes mellitus with other specified complication: Secondary | ICD-10-CM | POA: Diagnosis not present

## 2020-10-16 DIAGNOSIS — K760 Fatty (change of) liver, not elsewhere classified: Secondary | ICD-10-CM | POA: Diagnosis not present

## 2020-10-16 DIAGNOSIS — E039 Hypothyroidism, unspecified: Secondary | ICD-10-CM | POA: Diagnosis not present

## 2020-10-16 DIAGNOSIS — Z0000 Encounter for general adult medical examination without abnormal findings: Secondary | ICD-10-CM | POA: Diagnosis not present

## 2020-10-16 DIAGNOSIS — I10 Essential (primary) hypertension: Secondary | ICD-10-CM | POA: Diagnosis not present

## 2020-10-16 DIAGNOSIS — Z13828 Encounter for screening for other musculoskeletal disorder: Secondary | ICD-10-CM | POA: Diagnosis not present

## 2020-10-16 DIAGNOSIS — L97511 Non-pressure chronic ulcer of other part of right foot limited to breakdown of skin: Secondary | ICD-10-CM | POA: Diagnosis not present

## 2020-10-16 DIAGNOSIS — K589 Irritable bowel syndrome without diarrhea: Secondary | ICD-10-CM | POA: Diagnosis not present

## 2020-10-30 DIAGNOSIS — L570 Actinic keratosis: Secondary | ICD-10-CM | POA: Diagnosis not present

## 2020-10-30 DIAGNOSIS — Z85828 Personal history of other malignant neoplasm of skin: Secondary | ICD-10-CM | POA: Diagnosis not present

## 2020-11-12 ENCOUNTER — Other Ambulatory Visit: Payer: Self-pay

## 2020-11-12 ENCOUNTER — Ambulatory Visit
Admission: RE | Admit: 2020-11-12 | Discharge: 2020-11-12 | Disposition: A | Discharge status: Home / Self Care | Payer: Medicare Other | Source: Ambulatory Visit | Attending: Physical Medicine and Rehabilitation | Admitting: Physical Medicine and Rehabilitation

## 2020-11-12 DIAGNOSIS — Z1231 Encounter for screening mammogram for malignant neoplasm of breast: Secondary | ICD-10-CM

## 2020-12-27 ENCOUNTER — Encounter (HOSPITAL_COMMUNITY): Payer: Self-pay

## 2020-12-27 NOTE — Patient Instructions (Addendum)
DUE TO COVID-19 ONLY ONE VISITOR IS ALLOWED TO COME WITH YOU AND STAY IN THE WAITING ROOM ONLY DURING PRE OP AND PROCEDURE DAY OF SURGERY.   TWO VISITOR  MAY VISIT WITH YOU AFTER SURGERY IN YOUR PRIVATE ROOM DURING VISITING HOURS ONLY!  YOU NEED TO HAVE A COVID 19 TEST ON__9-7-22_____Between 8am-3pm______, THIS TEST MUST BE DONE BEFORE SURGERY,     Please bring completed form with you to the COVID testing site   COVID TESTING SITE [place] TEST IS COMPLETED,  PLEASE Wear a mask when in public           Your procedure is scheduled on: 01-11-21   Report to [facility] Main  Entrance   Report to admitting at       1205  PM     Call this number if you have problems the morning of surgery [phone]   Remember: Do not eat food  :After Midnight.     You may have clear liquids until 1250 pm  the day of your surgery then nothing by mouth   CLEAR LIQUID DIET                                                                    water Black Coffee and tea, regular and decaf                             Plain Jell-O any favor except red or purple                                  Fruit ices (not with fruit pulp)                                      Iced Popsicles                                     Carbonated beverages, regular and diet                                    Cranberry, grape and apple juices Sports drinks like Gatorade Lightly seasoned clear broth or consume(fat free) Sugar, honey syrup   _____________________________________________________________________     BRUSH YOUR TEETH MORNING OF SURGERY AND RINSE YOUR MOUTH OUT, NO CHEWING GUM CANDY OR MINTS.     Take these medicines the morning of surgery with A SIP OF WATER: pantoprazole, oxycodone if needed, levothyroxine, eye drops as usual  DO NOT TAKE ANY DIABETIC MEDICATIONS DAY OF YOUR SURGERY                               You may not have any  metal on your body including hair pins and  piercings  Do not wear jewelry, make-up, lotions, powders,perfumes,      deodorant             Do not wear nail polish on your fingernails or toenails .  Do not shave  48 hours prior to surgery.              Do not bring valuables to the hospital. [place].  Contacts, dentures or bridgework may not be worn into surgery.       Patients discharged the day of surgery will not be allowed to drive home. IF YOU ARE HAVING SURGERY AND GOING HOME THE SAME DAY, YOU MUST HAVE AN ADULT TO DRIVE YOU HOME AND BE WITH YOU FOR 24 HOURS. YOU MAY GO HOME BY TAXI OR UBER OR ORTHERWISE, BUT AN ADULT MUST ACCOMPANY YOU HOME AND STAY WITH YOU FOR 24 HOURS.  Name and phone number of your driver:  Special Instructions: N/A              Please read over the following fact sheets you were given: _____________________________________________________________________             [facility] - Preparing for Surgery Before surgery, you can play an important role.  Because skin is not sterile, your skin needs to be as free of germs as possible.  You can reduce the number of germs on your skin by washing with CHG (chlorahexidine gluconate) soap before surgery.  CHG is an antiseptic cleaner which kills germs and bonds with the skin to continue killing germs even after washing. Please DO NOT use if you have an allergy to CHG or antibacterial soaps.  If your skin becomes reddened/irritated stop using the CHG and inform your nurse when you arrive at Short Stay. Do not shave (including legs and underarms) for at least 48 hours prior to the first CHG shower.  You may shave your face/neck. Please follow these instructions carefully:  1.  Shower with CHG Soap the night before surgery and the  morning of Surgery.  2.  If you choose to wash your hair, wash your hair first as usual with your  normal  shampoo.  3.  After you  shampoo, rinse your hair and body thoroughly to remove the  shampoo.                           4.  Use CHG as you would any other liquid soap.  You can apply chg directly  to the skin and wash                       Gently with a scrungie or clean washcloth.  5.  Apply the CHG Soap to your body ONLY FROM THE NECK DOWN.   Do not use on face/ open                           Wound or open sores. Avoid contact with eyes, ears mouth and genitals (private parts).                       Wash face,  Genitals (private parts) with your normal soap.             6.  Wash thoroughly, paying  special attention to the area where your surgery  will be performed.  7.  Thoroughly rinse your body with warm water from the neck down.  8.  DO NOT shower/wash with your normal soap after using and rinsing off  the CHG Soap.                9.  Pat yourself dry with a clean towel.            10.  Wear clean pajamas.            11.  Place clean sheets on your bed the night of your first shower and do not  sleep with pets. Day of Surgery : Do not apply any lotions/deodorants the morning of surgery.  Please wear clean clothes to the hospital/surgery center.  FAILURE TO FOLLOW THESE INSTRUCTIONS MAY RESULT IN THE CANCELLATION OF YOUR SURGERY PATIENT SIGNATURE_________________________________  NURSE SIGNATURE__________________________________  ____  Incentive Spirometer  An incentive spirometer is a tool that can help keep your lungs clear and active. This tool measures how well you are filling your lungs with each breath. Taking long deep breaths may help reverse or decrease the chance of developing breathing (pulmonary) problems (especially infection) following: A long period of time when you are unable to move or be active. BEFORE THE PROCEDURE  If the spirometer includes an indicator to show your best effort, your nurse or respiratory therapist will set it to a desired goal. If possible, sit up straight or lean slightly  forward. Try not to slouch. Hold the incentive spirometer in an upright position. INSTRUCTIONS FOR USE  Sit on the edge of your bed if possible, or sit up as far as you can in bed or on a chair. Hold the incentive spirometer in an upright position. Breathe out normally. Place the mouthpiece in your mouth and seal your lips tightly around it. Breathe in slowly and as deeply as possible, raising the piston or the ball toward the top of the column. Hold your breath for 3-5 seconds or for as long as possible. Allow the piston or ball to fall to the bottom of the column. Remove the mouthpiece from your mouth and breathe out normally. Rest for a few seconds and repeat Steps 1 through 7 at least 10 times every 1-2 hours when you are awake. Take your time and take a few normal breaths between deep breaths. The spirometer may include an indicator to show your best effort. Use the indicator as a goal to work toward during each repetition. After each set of 10 deep breaths, practice coughing to be sure your lungs are clear. If you have an incision (the cut made at the time of surgery), support your incision when coughing by placing a pillow or rolled up towels firmly against it. Once you are able to get out of bed, walk around indoors and cough well. You may stop using the incentive spirometer when instructed by your caregiver.  RISKS AND COMPLICATIONS Take your time so you do not get dizzy or light-headed. If you are in pain, you may need to take or ask for pain medication before doing incentive spirometry. It is harder to take a deep breath if you are having pain. AFTER USE Rest and breathe slowly and easily. It can be helpful to keep track of a log of your progress. Your caregiver can provide you with a simple table to help with this. If you are using the spirometer at home, follow these  instructions: SEEK MEDICAL CARE IF:  You are having difficultly using the spirometer. You have trouble using the  spirometer as often as instructed. Your pain medication is not giving enough relief while using the spirometer. You develop fever of 100.5 F (38.1 C) or higher. SEEK IMMEDIATE MEDICAL CARE IF:  You cough up bloody sputum that had not been present before. You develop fever of 102 F (38.9 C) or greater. You develop worsening pain at or near the incision site. MAKE SURE YOU:  Understand these instructions. Will watch your condition. Will get help right away if you are not doing well or get worse. Document Released: 09/01/2006 Document Revised: 07/14/2011 Document Reviewed: 11/02/2006 [facility] Patient Information 2014 [employer], [place].   ________________________________________________________________________ ____________________________________________________________________

## 2020-12-27 NOTE — Progress Notes (Addendum)
PCP - Clemon Chambers MD [place]  Cardiologist -   PPM/ICD -  Device Orders -  Rep Notified -   Chest x-ray -  EKG -  Stress Test -  ECHO -  Cardiac Cath -   Sleep Study -  CPAP -   Fasting Blood Sugar -  Checks Blood Sugar _____ times a day  Blood Thinner Instructions: Aspirin Instructions:  ERAS Protcol - PRE-SURGERY Ensure or G2-   COVID TEST- 01-09-21 COVID vaccine -  Activity-- Anesthesia review: DMII, HTN  Patient denies shortness of breath, fever, cough and chest pain at PAT appointment   All instructions explained to the patient, with a verbal understanding of the material. Patient agrees to go over the instructions while at home for a better understanding. Patient also instructed to self quarantine after being tested for COVID-19. The opportunity to ask questions was provided.

## 2020-12-27 NOTE — Progress Notes (Signed)
Please place orders in epic pr. Is scheduled for preop 01-28-21

## 2020-12-28 ENCOUNTER — Encounter (HOSPITAL_COMMUNITY): Payer: Self-pay

## 2020-12-28 ENCOUNTER — Encounter (HOSPITAL_COMMUNITY)
Admission: RE | Admit: 2020-12-28 | Discharge: 2020-12-28 | Disposition: A | Discharge status: Home / Self Care | Payer: Medicare Other | Source: Ambulatory Visit | Attending: Orthopedic Surgery | Admitting: Orthopedic Surgery

## 2020-12-28 ENCOUNTER — Other Ambulatory Visit: Payer: Self-pay

## 2020-12-28 DIAGNOSIS — Z01818 Encounter for other preprocedural examination: Secondary | ICD-10-CM | POA: Diagnosis present

## 2020-12-28 HISTORY — DX: Gastroparesis: K31.84

## 2020-12-28 HISTORY — DX: Essential (primary) hypertension: I10

## 2020-12-28 LAB — CBC
HCT: 42.1 % (ref 36.0–46.0)
Hemoglobin: 14.2 g/dL (ref 12.0–15.0)
MCH: 31.1 pg (ref 26.0–34.0)
MCHC: 33.7 g/dL (ref 30.0–36.0)
MCV: 92.3 fL (ref 80.0–100.0)
Platelets: 214 10*3/uL (ref 150–400)
RBC: 4.56 MIL/uL (ref 3.87–5.11)
RDW: 13 % (ref 11.5–15.5)
WBC: 6.7 10*3/uL (ref 4.0–10.5)
nRBC: 0 % (ref 0.0–0.2)

## 2020-12-28 LAB — GLUCOSE, CAPILLARY: Glucose-Capillary: 142 mg/dL — ABNORMAL HIGH (ref 70–99)

## 2020-12-28 LAB — SURGICAL PCR SCREEN
MRSA, PCR: NEGATIVE
Staphylococcus aureus: NEGATIVE

## 2020-12-28 NOTE — Progress Notes (Addendum)
PCP - Sueanne Margarita, MD  LOV note and clearance on chart Cardiologist - no  PPM/ICD -  Device Orders -  Rep Notified -   Chest x-ray -  EKG - 12-27-20 on chart Stress Test -  ECHO -  Cardiac Cath -  CMP,PT, HgbA1c  12-27-20  on chart  Sleep Study -  CPAP -   Fasting Blood Sugar - 180 Checks Blood Sugar _3 times a week___ _  Blood Thinner Instructions: Aspirin Instructions:81 mg  ERAS Protcol - PRE-SURGERY Ensure or G2-   COVID TEST- 01-09-21  COVID vaccine -fully vac + 1 booster moderna  Activity--Able to walk a flight of stairs without SOB Anesthesia review: HTN DM  no meds  Patient denies shortness of breath, fever, cough and chest pain at PAT appointment   All instructions explained to the patient, with a verbal understanding of the material. Patient agrees to go over the instructions while at home for a better understanding. Patient also instructed to self quarantine after being tested for COVID-19. The opportunity to ask questions was provided.

## 2021-01-02 NOTE — H&P (Signed)
Patient's anticipated LOS is less than 2 midnights, meeting these requirements: - Younger than 41 - Lives within 1 hour of care - Has a competent adult at home to recover with post-op recover - NO history of  - Chronic pain requiring opiods  - Diabetes  - Coronary Artery Disease  - Heart failure  - Heart attack  - Stroke  - DVT/VTE  - Cardiac arrhythmia  - Respiratory Failure/COPD  - Renal failure  - Anemia  - Advanced Liver disease     Haley Jenkins is an 70 y.o. female.    Chief Complaint:right knee pain  HPI: Pt is a 70 y.o. female complaining of right knee pain for multiple years. Pain had continually increased since the beginning. X-rays in the clinic show end-stage arthritic changes of the right knee. Pt has tried various conservative treatments which have failed to alleviate their symptoms, including injections and therapy. Various options are discussed with the patient. Risks, benefits and expectations were discussed with the patient. Patient understand the risks, benefits and expectations and wishes to proceed with surgery.   PCP:  Sueanne Margarita, DO  D/C Plans: Home  PMH: 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   Family history of breast cancer    Family history of colon cancer    Family history of ovarian cancer    Fracture of knee region 10/2014   Bilateral    Gastric polyp    Gastroparesis    GERD (gastroesophageal reflux disease)    Hepatic steatosis 05/09/2019   By ultrasound 04/2019   Hiatal hernia    History of kidney stones    Hypertension    Hypothyroidism    Irritable bowel syndrome    Lactose intolerance    RSD (reflex sympathetic dystrophy)     PSH: Past Surgical History:  Procedure Laterality Date   BREAST BIOPSY Left    BREAST EXCISIONAL BIOPSY Right    breast surgery cluster cyst  removed     x 2   CARPAL TUNNEL RELEASE Left 2019   cleft palate repaired  birth, 1983   at [place] Right 11/04/2016   [place], 2003   KNEE ARTHROSCOPY Bilateral 11/2014   [place] [facility]   KNEE SURGERY Left 1976   TUBAL LIGATION  1987   URETHRAL SLING  03/2012   mid-urethral sling   Bladder    Social History:  reports that she has never smoked. She has never used smokeless tobacco. She reports that she does not drink alcohol and does not use drugs.  Allergies:  Allergies  Allergen Reactions   Morphine And Related Rash and Other (See Comments)    HallucinationS   Sulfa Antibiotics Nausea And Vomiting and Nausea Only    Medications: No current facility-administered medications for this encounter.   Current Outpatient Medications  Medication Sig Dispense Refill   aspirin 81 MG tablet Take 81 mg by mouth daily.      Calcium-Magnesium-Vitamin D (CALCIUM MAGNESIUM PO) Take 1,500 mg by mouth daily.      Cholecalciferol (VITAMIN D-3) 5000 units TABS Take 5,000 Units by mouth daily.     cyanocobalamin (,VITAMIN B-12,) 1000 MCG/ML injection INJECT 1 ML INTRAMUSCULARLY ONCE EVERY MONTH 3 mL  0   diclofenac Sodium (VOLTAREN) 1 % GEL Apply 2 g topically 4 (four) times daily as needed (pain).     Eszopiclone 3 MG TABS Take 3 mg by mouth at bedtime. Take immediately before bedtime     FLAREX 0.1 % ophthalmic suspension Place 1 drop into both eyes 4 (four) times daily.     levothyroxine (SYNTHROID) 50 MCG tablet Take 1 tablet (50 mcg total) by mouth daily. 90 tablet 3   lisinopril-hydrochlorothiazide (ZESTORETIC) 20-25 MG tablet TAKE 1 TABLET DAILY (Patient taking differently: Take 0.5 tablets by mouth daily.) 90 tablet 3   oxyCODONE (ROXICODONE) 15 MG immediate release tablet Take 15 mg by mouth 4 (four) times daily as needed for pain.     pantoprazole (PROTONIX) 40 MG tablet TAKE 1 TABLET DAILY 90 tablet 3   promethazine  (PHENERGAN) 25 MG suppository Place 1 suppository (25 mg total) rectally every 6 (six) hours as needed for nausea or vomiting. 12 each 2   triamcinolone cream (KENALOG) 0.5 % Apply 1 application topically 3 (three) times daily as needed (eczema).     atorvastatin (LIPITOR) 20 MG tablet Take 1 tablet (20 mg total) by mouth daily. (Patient not taking: No sig reported) 30 tablet 0   fluticasone (FLONASE) 50 MCG/ACT nasal spray PLACE TWO SPRAYS INTO THE NOSE DAILY (Patient not taking: No sig reported) 16 g 5   SYRINGE-NEEDLE, DISP, 3 ML (B-D 3CC LUER-LOK SYR 25GX1") 25G X 1" 3 ML MISC USE TO INJECT VITAMIN B12 EVERY 30 DAYS 12 each 2    No results found for this or any previous visit (from the past 48 hour(s)). No results found.  ROS: Pain with rom of the right lower extremity  Physical Exam: Alert and oriented 70 y.o. female in no acute distress Cranial nerves 2-12 intact Cervical spine: full rom with no tenderness, nv intact distally Chest: active breath sounds bilaterally, no wheeze rhonchi or rales Heart: regular rate and rhythm, no murmur Abd: non tender non distended with active bowel sounds Hip is stable with rom  Right knee painful rom with crepitus Nv intact distally No rashes or edema distally Antalgic gait  Assessment/Plan Assessment: right knee end stage osteoarthritis  Plan:  Patient will undergo a right total knee by Dr. Veverly Fells at [place] Risks benefits and expectations were discussed with the patient. Patient understand risks, benefits and expectations and wishes to proceed. Preoperative templating of the joint replacement has been completed, documented, and submitted to the Operating Room personnel in order to optimize intra-operative equipment management.   Merla Riches PA-C, MPAS [facility] is now [employer] [address] [place], [place], [place] [address] Phone: [phone] [email or web address] Facebook  [employer]

## 2021-01-09 ENCOUNTER — Other Ambulatory Visit: Payer: Self-pay | Admitting: Orthopedic Surgery

## 2021-01-09 LAB — SARS CORONAVIRUS 2 (TAT 6-24 HRS): SARS Coronavirus 2: NEGATIVE

## 2021-01-11 ENCOUNTER — Encounter (HOSPITAL_COMMUNITY): Payer: Self-pay | Admitting: Orthopedic Surgery

## 2021-01-11 ENCOUNTER — Ambulatory Visit (HOSPITAL_COMMUNITY): Payer: Medicare Other | Admitting: Certified Registered Nurse Anesthetist

## 2021-01-11 ENCOUNTER — Observation Stay (HOSPITAL_COMMUNITY)
Admission: RE | Admit: 2021-01-11 | Discharge: 2021-01-13 | Disposition: A | Discharge status: Home / Self Care | Payer: Medicare Other | Source: Ambulatory Visit | Attending: Orthopedic Surgery | Admitting: Orthopedic Surgery

## 2021-01-11 ENCOUNTER — Other Ambulatory Visit: Payer: Self-pay

## 2021-01-11 ENCOUNTER — Encounter (HOSPITAL_COMMUNITY)
Admission: RE | Disposition: A | Discharge status: Home / Self Care | Payer: Self-pay | Source: Ambulatory Visit | Attending: Orthopedic Surgery

## 2021-01-11 DIAGNOSIS — Z79899 Other long term (current) drug therapy: Secondary | ICD-10-CM | POA: Insufficient documentation

## 2021-01-11 DIAGNOSIS — E119 Type 2 diabetes mellitus without complications: Secondary | ICD-10-CM | POA: Insufficient documentation

## 2021-01-11 DIAGNOSIS — I10 Essential (primary) hypertension: Secondary | ICD-10-CM | POA: Diagnosis not present

## 2021-01-11 DIAGNOSIS — M1711 Unilateral primary osteoarthritis, right knee: Secondary | ICD-10-CM | POA: Diagnosis not present

## 2021-01-11 DIAGNOSIS — Z7982 Long term (current) use of aspirin: Secondary | ICD-10-CM | POA: Diagnosis not present

## 2021-01-11 DIAGNOSIS — Z96651 Presence of right artificial knee joint: Secondary | ICD-10-CM

## 2021-01-11 DIAGNOSIS — E039 Hypothyroidism, unspecified: Secondary | ICD-10-CM | POA: Diagnosis not present

## 2021-01-11 HISTORY — DX: Type 2 diabetes mellitus without complications: E11.9

## 2021-01-11 HISTORY — PX: TOTAL KNEE ARTHROPLASTY: SHX125

## 2021-01-11 LAB — GLUCOSE, CAPILLARY
Glucose-Capillary: 127 mg/dL — ABNORMAL HIGH (ref 70–99)
Glucose-Capillary: 115 mg/dL — ABNORMAL HIGH (ref 70–99)

## 2021-01-11 SURGERY — ARTHROPLASTY, KNEE, TOTAL
Anesthesia: Spinal | Site: Knee | Laterality: Right

## 2021-01-11 MED ORDER — ONDANSETRON HCL 4 MG/2ML IJ SOLN: 4.0000 mg | Freq: Once | INTRAMUSCULAR | Status: DC | PRN

## 2021-01-11 MED ORDER — MENTHOL 3 MG MT LOZG: 1.0000 | LOZENGE | OROMUCOSAL | Status: DC | PRN

## 2021-01-11 MED ORDER — POLYETHYLENE GLYCOL 3350 17 G PO PACK
17.0000 g | PACK | Freq: Every day | ORAL | Status: DC | PRN
  Filled 2021-01-11: qty 1

## 2021-01-11 MED ORDER — SODIUM CHLORIDE (PF) 0.9 % IJ SOLN
INTRAMUSCULAR | Status: DC | PRN
  Administered 2021-01-11: 30 mL

## 2021-01-11 MED ORDER — METHOCARBAMOL 500 MG PO TABS: 500.0000 mg | ORAL_TABLET | Freq: Four times a day (QID) | ORAL | 1 refills | Status: DC | PRN

## 2021-01-11 MED ORDER — PHENYLEPHRINE 40 MCG/ML (10ML) SYRINGE FOR IV PUSH (FOR BLOOD PRESSURE SUPPORT)
PREFILLED_SYRINGE | INTRAVENOUS | Status: AC
  Filled 2021-01-11: qty 10

## 2021-01-11 MED ORDER — METOCLOPRAMIDE HCL 5 MG PO TABS: 5.0000 mg | ORAL_TABLET | Freq: Three times a day (TID) | ORAL | Status: DC | PRN

## 2021-01-11 MED ORDER — LISINOPRIL-HYDROCHLOROTHIAZIDE 20-25 MG PO TABS: 1.0000 | ORAL_TABLET | Freq: Every day | ORAL | Status: DC

## 2021-01-11 MED ORDER — CHLORHEXIDINE GLUCONATE 0.12 % MT SOLN
15.0000 mL | Freq: Once | OROMUCOSAL | Status: AC
  Administered 2021-01-11: 15 mL via OROMUCOSAL

## 2021-01-11 MED ORDER — LEVOTHYROXINE SODIUM 50 MCG PO TABS
50.0000 ug | ORAL_TABLET | Freq: Every day | ORAL | Status: DC
  Administered 2021-01-12 – 2021-01-13 (×2): 50 ug via ORAL
  Filled 2021-01-11 (×2): qty 1

## 2021-01-11 MED ORDER — DICLOFENAC SODIUM 1 % EX GEL
2.0000 g | Freq: Four times a day (QID) | CUTANEOUS | Status: DC | PRN
  Filled 2021-01-11: qty 100

## 2021-01-11 MED ORDER — FENTANYL CITRATE (PF) 100 MCG/2ML IJ SOLN
INTRAMUSCULAR | Status: DC | PRN
  Administered 2021-01-11: 50 ug via INTRAVENOUS

## 2021-01-11 MED ORDER — BUPIVACAINE LIPOSOME 1.3 % IJ SUSP
INTRAMUSCULAR | Status: AC
  Filled 2021-01-11: qty 20

## 2021-01-11 MED ORDER — PHENOL 1.4 % MT LIQD: 1.0000 | OROMUCOSAL | Status: DC | PRN

## 2021-01-11 MED ORDER — HYDROMORPHONE HCL 1 MG/ML IJ SOLN: 0.5000 mg | INTRAMUSCULAR | Status: DC | PRN

## 2021-01-11 MED ORDER — PROPOFOL 10 MG/ML IV BOLUS
INTRAVENOUS | Status: DC | PRN
  Administered 2021-01-11: 10 mg via INTRAVENOUS
  Administered 2021-01-11 (×3): 20 mg via INTRAVENOUS

## 2021-01-11 MED ORDER — TRANEXAMIC ACID-NACL 1000-0.7 MG/100ML-% IV SOLN
1000.0000 mg | INTRAVENOUS | Status: AC
  Administered 2021-01-11: 1000 mg via INTRAVENOUS
  Filled 2021-01-11: qty 100

## 2021-01-11 MED ORDER — BUPIVACAINE IN DEXTROSE 0.75-8.25 % IT SOLN
INTRATHECAL | Status: DC | PRN
Start: 2021-01-11 — End: 2021-01-11
  Administered 2021-01-11: 1.6 mL via INTRATHECAL

## 2021-01-11 MED ORDER — ONDANSETRON HCL 4 MG/2ML IJ SOLN
INTRAMUSCULAR | Status: DC | PRN
  Administered 2021-01-11: 4 mg via INTRAVENOUS

## 2021-01-11 MED ORDER — PROPOFOL 500 MG/50ML IV EMUL
INTRAVENOUS | Status: DC | PRN
  Administered 2021-01-11: 75 ug/kg/min via INTRAVENOUS

## 2021-01-11 MED ORDER — SODIUM CHLORIDE 0.9 % IV SOLN: INTRAVENOUS | Status: DC

## 2021-01-11 MED ORDER — FERROUS SULFATE 325 (65 FE) MG PO TABS
325.0000 mg | ORAL_TABLET | Freq: Three times a day (TID) | ORAL | Status: DC
  Filled 2021-01-11 (×2): qty 1

## 2021-01-11 MED ORDER — VITAMIN D3 25 MCG (1000 UNIT) PO TABS
5000.0000 [IU] | ORAL_TABLET | Freq: Every day | ORAL | Status: DC
  Administered 2021-01-12 – 2021-01-13 (×2): 5000 [IU] via ORAL
  Filled 2021-01-11 (×5): qty 5

## 2021-01-11 MED ORDER — ONDANSETRON HCL 4 MG/2ML IJ SOLN
INTRAMUSCULAR | Status: AC
  Filled 2021-01-11: qty 2

## 2021-01-11 MED ORDER — DEXAMETHASONE SODIUM PHOSPHATE 10 MG/ML IJ SOLN
INTRAMUSCULAR | Status: AC
  Filled 2021-01-11: qty 1

## 2021-01-11 MED ORDER — BUPIVACAINE-EPINEPHRINE 0.25% -1:200000 IJ SOLN
INTRAMUSCULAR | Status: DC | PRN
  Administered 2021-01-11: 30 mL

## 2021-01-11 MED ORDER — PROPOFOL 500 MG/50ML IV EMUL
INTRAVENOUS | Status: AC
  Filled 2021-01-11: qty 50

## 2021-01-11 MED ORDER — ONDANSETRON HCL 4 MG PO TABS
4.0000 mg | ORAL_TABLET | Freq: Four times a day (QID) | ORAL | Status: DC | PRN
  Administered 2021-01-12: 4 mg via ORAL
  Filled 2021-01-11: qty 1

## 2021-01-11 MED ORDER — TRIAMCINOLONE ACETONIDE 0.5 % EX CREA
1.0000 "application " | TOPICAL_CREAM | Freq: Three times a day (TID) | CUTANEOUS | Status: DC | PRN
  Filled 2021-01-11: qty 15

## 2021-01-11 MED ORDER — CEFAZOLIN SODIUM-DEXTROSE 2-4 GM/100ML-% IV SOLN
2.0000 g | INTRAVENOUS | Status: AC
  Administered 2021-01-11: 2 g via INTRAVENOUS
  Filled 2021-01-11: qty 100

## 2021-01-11 MED ORDER — FENTANYL CITRATE PF 50 MCG/ML IJ SOSY: 25.0000 ug | PREFILLED_SYRINGE | INTRAMUSCULAR | Status: DC | PRN

## 2021-01-11 MED ORDER — FLUTICASONE PROPIONATE 50 MCG/ACT NA SUSP
2.0000 | Freq: Every day | NASAL | Status: DC | PRN
  Filled 2021-01-11: qty 16

## 2021-01-11 MED ORDER — HYDROCHLOROTHIAZIDE 25 MG PO TABS
25.0000 mg | ORAL_TABLET | Freq: Every day | ORAL | Status: DC
  Filled 2021-01-11 (×2): qty 1

## 2021-01-11 MED ORDER — WATER FOR IRRIGATION, STERILE IR SOLN
Status: DC | PRN
  Administered 2021-01-11: 2000 mL

## 2021-01-11 MED ORDER — BUPIVACAINE LIPOSOME 1.3 % IJ SUSP: 20.0000 mL | Freq: Once | INTRAMUSCULAR | Status: DC

## 2021-01-11 MED ORDER — METHOCARBAMOL 500 MG PO TABS
500.0000 mg | ORAL_TABLET | Freq: Four times a day (QID) | ORAL | Status: DC | PRN
  Administered 2021-01-11 – 2021-01-12 (×3): 500 mg via ORAL
  Filled 2021-01-11 (×3): qty 1

## 2021-01-11 MED ORDER — PROMETHAZINE HCL 25 MG RE SUPP
25.0000 mg | Freq: Four times a day (QID) | RECTAL | Status: DC | PRN
  Filled 2021-01-11: qty 1

## 2021-01-11 MED ORDER — BUPIVACAINE-EPINEPHRINE (PF) 0.25% -1:200000 IJ SOLN
INTRAMUSCULAR | Status: AC
  Filled 2021-01-11: qty 30

## 2021-01-11 MED ORDER — PANTOPRAZOLE SODIUM 40 MG PO TBEC
40.0000 mg | DELAYED_RELEASE_TABLET | Freq: Every day | ORAL | Status: DC
  Administered 2021-01-12 – 2021-01-13 (×2): 40 mg via ORAL
  Filled 2021-01-11 (×2): qty 1

## 2021-01-11 MED ORDER — OXYCODONE HCL 5 MG PO TABS
15.0000 mg | ORAL_TABLET | ORAL | Status: DC | PRN
  Administered 2021-01-11 – 2021-01-12 (×3): 15 mg via ORAL
  Filled 2021-01-11 (×5): qty 3

## 2021-01-11 MED ORDER — DEXAMETHASONE SODIUM PHOSPHATE 10 MG/ML IJ SOLN
INTRAMUSCULAR | Status: DC | PRN
  Administered 2021-01-11: 10 mg via INTRAVENOUS

## 2021-01-11 MED ORDER — 0.9 % SODIUM CHLORIDE (POUR BTL) OPTIME
TOPICAL | Status: DC | PRN
  Administered 2021-01-11: 1000 mL

## 2021-01-11 MED ORDER — FLUOROMETHOLONE 0.1 % OP SUSP
1.0000 [drp] | Freq: Four times a day (QID) | OPHTHALMIC | Status: DC
  Administered 2021-01-11 – 2021-01-13 (×5): 1 [drp] via OPHTHALMIC
  Filled 2021-01-11: qty 5

## 2021-01-11 MED ORDER — MIDAZOLAM HCL 2 MG/2ML IJ SOLN
2.0000 mg | Freq: Once | INTRAMUSCULAR | Status: AC
  Administered 2021-01-11: 2 mg via INTRAVENOUS
  Filled 2021-01-11: qty 2

## 2021-01-11 MED ORDER — SODIUM CHLORIDE 0.9 % IR SOLN
Status: DC | PRN
  Administered 2021-01-11: 1000 mL

## 2021-01-11 MED ORDER — LISINOPRIL 20 MG PO TABS
20.0000 mg | ORAL_TABLET | Freq: Every day | ORAL | Status: DC
  Filled 2021-01-11 (×2): qty 1

## 2021-01-11 MED ORDER — POVIDONE-IODINE 10 % EX SWAB
2.0000 "application " | Freq: Once | CUTANEOUS | Status: AC
  Administered 2021-01-11: 2 via TOPICAL

## 2021-01-11 MED ORDER — ACETAMINOPHEN 325 MG PO TABS
325.0000 mg | ORAL_TABLET | Freq: Four times a day (QID) | ORAL | Status: DC | PRN
  Administered 2021-01-12 (×2): 650 mg via ORAL
  Filled 2021-01-11 (×2): qty 2

## 2021-01-11 MED ORDER — CEFAZOLIN SODIUM-DEXTROSE 2-4 GM/100ML-% IV SOLN
2.0000 g | Freq: Four times a day (QID) | INTRAVENOUS | Status: AC
  Administered 2021-01-11 – 2021-01-12 (×2): 2 g via INTRAVENOUS
  Filled 2021-01-11 (×2): qty 100

## 2021-01-11 MED ORDER — ATORVASTATIN CALCIUM 20 MG PO TABS
20.0000 mg | ORAL_TABLET | Freq: Every day | ORAL | Status: DC
  Filled 2021-01-11 (×2): qty 1

## 2021-01-11 MED ORDER — CLONIDINE HCL (ANALGESIA) 100 MCG/ML EP SOLN
EPIDURAL | Status: DC | PRN
  Administered 2021-01-11: 50 ug

## 2021-01-11 MED ORDER — FENTANYL CITRATE (PF) 100 MCG/2ML IJ SOLN
INTRAMUSCULAR | Status: AC
  Filled 2021-01-11: qty 2

## 2021-01-11 MED ORDER — DOCUSATE SODIUM 100 MG PO CAPS
100.0000 mg | ORAL_CAPSULE | Freq: Two times a day (BID) | ORAL | Status: DC
  Administered 2021-01-11 – 2021-01-13 (×4): 100 mg via ORAL
  Filled 2021-01-11 (×4): qty 1

## 2021-01-11 MED ORDER — BUPIVACAINE LIPOSOME 1.3 % IJ SUSP
INTRAMUSCULAR | Status: DC | PRN
  Administered 2021-01-11: 20 mL

## 2021-01-11 MED ORDER — ASPIRIN 81 MG PO CHEW
81.0000 mg | CHEWABLE_TABLET | Freq: Two times a day (BID) | ORAL | Status: DC
  Administered 2021-01-11 – 2021-01-13 (×4): 81 mg via ORAL
  Filled 2021-01-11 (×5): qty 1

## 2021-01-11 MED ORDER — METHOCARBAMOL 1000 MG/10ML IJ SOLN
500.0000 mg | Freq: Four times a day (QID) | INTRAVENOUS | Status: DC | PRN
  Filled 2021-01-11: qty 5

## 2021-01-11 MED ORDER — HYDROMORPHONE HCL 2 MG PO TABS
2.0000 mg | ORAL_TABLET | ORAL | Status: DC | PRN
  Administered 2021-01-12 – 2021-01-13 (×6): 2 mg via ORAL
  Filled 2021-01-11 (×7): qty 1

## 2021-01-11 MED ORDER — FENTANYL CITRATE PF 50 MCG/ML IJ SOSY
100.0000 ug | PREFILLED_SYRINGE | Freq: Once | INTRAMUSCULAR | Status: AC
  Administered 2021-01-11: 50 ug via INTRAVENOUS
  Filled 2021-01-11: qty 2

## 2021-01-11 MED ORDER — SODIUM CHLORIDE (PF) 0.9 % IJ SOLN
INTRAMUSCULAR | Status: AC
  Filled 2021-01-11: qty 30

## 2021-01-11 MED ORDER — ONDANSETRON HCL 4 MG/2ML IJ SOLN: 4.0000 mg | Freq: Four times a day (QID) | INTRAMUSCULAR | Status: DC | PRN

## 2021-01-11 MED ORDER — PHENYLEPHRINE HCL (PRESSORS) 10 MG/ML IV SOLN
INTRAVENOUS | Status: DC | PRN
  Administered 2021-01-11: 80 ug via INTRAVENOUS
  Administered 2021-01-11 (×4): 40 ug via INTRAVENOUS

## 2021-01-11 MED ORDER — LACTATED RINGERS IV SOLN: INTRAVENOUS | Status: DC

## 2021-01-11 MED ORDER — BISACODYL 10 MG RE SUPP: 10.0000 mg | Freq: Every day | RECTAL | Status: DC | PRN

## 2021-01-11 MED ORDER — HYDROMORPHONE HCL 2 MG PO TABS: 2.0000 mg | ORAL_TABLET | ORAL | 0 refills | Status: DC | PRN

## 2021-01-11 MED ORDER — ORAL CARE MOUTH RINSE: 15.0000 mL | Freq: Once | OROMUCOSAL | Status: AC

## 2021-01-11 MED ORDER — ASPIRIN 81 MG PO TABS: 81.0000 mg | ORAL_TABLET | Freq: Two times a day (BID) | ORAL | 0 refills | Status: AC

## 2021-01-11 MED ORDER — ROPIVACAINE HCL 5 MG/ML IJ SOLN
INTRAMUSCULAR | Status: DC | PRN
  Administered 2021-01-11: 30 mL via PERINEURAL

## 2021-01-11 MED ORDER — TRANEXAMIC ACID-NACL 1000-0.7 MG/100ML-% IV SOLN
1000.0000 mg | Freq: Once | INTRAVENOUS | Status: AC
  Administered 2021-01-11: 1000 mg via INTRAVENOUS
  Filled 2021-01-11: qty 100

## 2021-01-11 MED ORDER — METOCLOPRAMIDE HCL 5 MG/ML IJ SOLN: 5.0000 mg | Freq: Three times a day (TID) | INTRAMUSCULAR | Status: DC | PRN

## 2021-01-11 SURGICAL SUPPLY — 58 items
ATTUNE MED DOME PAT 38 KNEE (Knees) ×2 IMPLANT
ATTUNE MED DOME PAT 38MM KNEE (Knees) ×1 IMPLANT
ATTUNE PSFEM RTSZ6 NARCEM KNEE (Femur) ×3 IMPLANT
ATTUNE PSRP INSR SZ6 8 KNEE (Insert) ×2 IMPLANT
ATTUNE PSRP INSR SZ6 8MM KNEE (Insert) ×1 IMPLANT
BAG COUNTER SPONGE SURGICOUNT (BAG) IMPLANT
BAG SPEC THK2 15X12 ZIP CLS (MISCELLANEOUS)
BAG SPNG CNTER NS LX DISP (BAG)
BAG SURGICOUNT SPONGE COUNTING (BAG)
BAG ZIPLOCK 12X15 (MISCELLANEOUS) IMPLANT
BASE TIBIAL ROT PLAT SZ 5 KNEE (Knees) ×1 IMPLANT
BLADE SAG 18X100X1.27 (BLADE) ×3 IMPLANT
BLADE SAW SGTL 13X75X1.27 (BLADE) ×3 IMPLANT
BNDG CMPR MED 10X6 ELC LF (GAUZE/BANDAGES/DRESSINGS) ×1
BNDG ELASTIC 6X10 VLCR STRL LF (GAUZE/BANDAGES/DRESSINGS) ×3 IMPLANT
BNDG GAUZE ELAST 4 BULKY (GAUZE/BANDAGES/DRESSINGS) ×3 IMPLANT
BOWL SMART MIX CTS (DISPOSABLE) ×3 IMPLANT
BSPLAT TIB 5 CMNT ROT PLAT STR (Knees) ×1 IMPLANT
CEMENT HV SMART SET (Cement) ×6 IMPLANT
CLOSURE WOUND 1/2 X4 (GAUZE/BANDAGES/DRESSINGS) ×2
COVER SURGICAL LIGHT HANDLE (MISCELLANEOUS) ×3 IMPLANT
CUFF TOURN SGL QUICK 34 (TOURNIQUET CUFF) ×3
CUFF TRNQT CYL 34X4.125X (TOURNIQUET CUFF) ×1 IMPLANT
DRAPE INCISE IOBAN 66X45 STRL (DRAPES) ×9 IMPLANT
DRAPE SHEET LG 3/4 BI-LAMINATE (DRAPES) ×3 IMPLANT
DRAPE U-SHAPE 47X51 STRL (DRAPES) ×3 IMPLANT
DRSG ADAPTIC 3X8 NADH LF (GAUZE/BANDAGES/DRESSINGS) ×3 IMPLANT
DRSG PAD ABDOMINAL 8X10 ST (GAUZE/BANDAGES/DRESSINGS) ×3 IMPLANT
DURAPREP 26ML APPLICATOR (WOUND CARE) ×3 IMPLANT
ELECT REM PT RETURN 15FT ADLT (MISCELLANEOUS) ×3 IMPLANT
GAUZE SPONGE 4X4 12PLY STRL (GAUZE/BANDAGES/DRESSINGS) ×3 IMPLANT
GLOVE SURG ORTHO LTX SZ7.5 (GLOVE) ×3 IMPLANT
GLOVE SURG ORTHO LTX SZ8.5 (GLOVE) ×3 IMPLANT
GLOVE SURG UNDER POLY LF SZ7.5 (GLOVE) ×3 IMPLANT
GLOVE SURG UNDER POLY LF SZ8.5 (GLOVE) ×3 IMPLANT
GOWN STRL REUS W/TWL XL LVL3 (GOWN DISPOSABLE) ×6 IMPLANT
HANDPIECE INTERPULSE COAX TIP (DISPOSABLE) ×3
HOLDER FOLEY CATH W/STRAP (MISCELLANEOUS) IMPLANT
IMMOBILIZER KNEE 20 (SOFTGOODS) ×3
IMMOBILIZER KNEE 20 THIGH 36 (SOFTGOODS) ×1 IMPLANT
KIT TURNOVER KIT A (KITS) ×3 IMPLANT
MANIFOLD NEPTUNE II (INSTRUMENTS) ×3 IMPLANT
NS IRRIG 1000ML POUR BTL (IV SOLUTION) ×3 IMPLANT
PACK TOTAL KNEE CUSTOM (KITS) ×3 IMPLANT
PIN DRILL FIX HALF THREAD (BIT) ×3 IMPLANT
PIN STEINMAN FIXATION KNEE (PIN) ×3 IMPLANT
PROTECTOR NERVE ULNAR (MISCELLANEOUS) ×3 IMPLANT
SET HNDPC FAN SPRY TIP SCT (DISPOSABLE) ×1 IMPLANT
STAPLER VISISTAT 35W (STAPLE) IMPLANT
STRIP CLOSURE SKIN 1/2X4 (GAUZE/BANDAGES/DRESSINGS) ×4 IMPLANT
SUT MNCRL AB 3-0 PS2 18 (SUTURE) ×3 IMPLANT
SUT VIC AB 0 CT1 36 (SUTURE) ×3 IMPLANT
SUT VIC AB 1 CT1 36 (SUTURE) ×9 IMPLANT
SUT VIC AB 2-0 CT1 27 (SUTURE) ×3
SUT VIC AB 2-0 CT1 TAPERPNT 27 (SUTURE) ×1 IMPLANT
TIBIAL BASE ROT PLAT SZ 5 KNEE (Knees) ×3 IMPLANT
TRAY FOLEY MTR SLVR 16FR STAT (SET/KITS/TRAYS/PACK) ×3 IMPLANT
WATER STERILE IRR 1000ML POUR (IV SOLUTION) ×6 IMPLANT

## 2021-01-11 NOTE — Progress Notes (Signed)
Orthopedic Tech Progress Note Patient Details:  Haley Jenkins Haley Jenkins March 04, 1951 [id number]  CPM Right Knee CPM Right Knee: On Right Knee Flexion (Degrees): 90  Post Interventions Patient Tolerated: Well   CPM applied, pt tolerated well, can move leg, but continues to have no sensation.   Verdene Lennert, PT, DPT  Acute Rehabilitation Ortho Tech Supervisor [phone] pager #[phone] office    [name] 01/11/2021, 5:58 PM

## 2021-01-11 NOTE — Brief Op Note (Signed)
01/11/2021  3:54 PM  PATIENT:  Haley Jenkins  70 y.o. female  PRE-OPERATIVE DIAGNOSIS:  right knee end stage osteoarthritis  POST-OPERATIVE DIAGNOSIS:  right knee end stage osteoarthritis  PROCEDURE:  Procedure(s) with comments: TOTAL KNEE ARTHROPLASTY (Right) - with block DePuy Attune  SURGEON:  Surgeon(s) and Role:    Netta Cedars, MD - Primary  PHYSICIAN ASSISTANT:   ASSISTANTS: Ventura Bruns, PA-C   ANESTHESIA:   regional and general  EBL:  50 mL   BLOOD ADMINISTERED:none  DRAINS: none   LOCAL MEDICATIONS USED:  MARCAINE     SPECIMEN:  No Specimen  DISPOSITION OF SPECIMEN:  N/A  COUNTS:  YES  TOURNIQUET:  * Missing tourniquet times found for documented tourniquets in log: [id number] *  DICTATION: .Other Dictation: Dictation Number [id number]  PLAN OF CARE: Admit for overnight observation  PATIENT DISPOSITION:  PACU - hemodynamically stable.   Delay start of Pharmacological VTE agent (>24hrs) due to surgical blood loss or risk of bleeding: no

## 2021-01-11 NOTE — Anesthesia Procedure Notes (Signed)
Anesthesia Regional Block: Adductor canal block   Pre-Anesthetic Checklist: , timeout performed,  Correct Patient, Correct Site, Correct Laterality,  Correct Procedure, Correct Position, site marked,  Risks and benefits discussed,  Surgical consent,  Pre-op evaluation,  At surgeon's request and post-op pain management  Laterality: Right  Prep: chloraprep       Needles:  Injection technique: Single-shot  Needle Type: Echogenic Needle     Needle Length: 9cm  Needle Gauge: 21     Additional Needles:   Procedures:,,,, ultrasound used (permanent image in chart),,    Narrative:  Start time: 01/11/2021 11:48 AM End time: 01/11/2021 11:54 AM Injection made incrementally with aspirations every 5 mL.  Performed by: Personally  Anesthesiologist: Catalina Gravel, MD  Additional Notes: No pain on injection. No increased resistance to injection. Injection made in 5cc increments.  Good needle visualization.  Patient tolerated procedure well.

## 2021-01-11 NOTE — Anesthesia Preprocedure Evaluation (Addendum)
Anesthesia Evaluation  Patient identified by MRN, date of birth, ID band Patient awake    Reviewed: Allergy & Precautions, NPO status , Patient's Chart, lab work & pertinent test results  Airway Mallampati: II  TM Distance: >3 FB Neck ROM: Full    Dental  (+) Teeth Intact, Dental Advisory Given   Pulmonary neg pulmonary ROS,    Pulmonary exam normal breath sounds clear to auscultation       Cardiovascular hypertension, Pt. on medications (-) angina(-) Past MI Normal cardiovascular exam Rhythm:Regular Rate:Normal     Neuro/Psych  Neuromuscular disease negative psych ROS   GI/Hepatic Neg liver ROS, hiatal hernia, GERD  Medicated and Controlled,  Endo/Other  diabetes, Type 2Hypothyroidism Obesity   Renal/GU negative Renal ROS     Musculoskeletal  (+) Arthritis , Osteoarthritis,  right knee end stage osteoarthritis   Abdominal   Peds  Hematology negative hematology ROS (+) Plt 214k on 12/28/20   Anesthesia Other Findings Day of surgery medications reviewed with the patient.  Reproductive/Obstetrics                            Anesthesia Physical Anesthesia Plan  ASA: 2  Anesthesia Plan: Spinal   Post-op Pain Management:  Regional for Post-op pain   Induction: Intravenous  PONV Risk Score and Plan: 2 and Propofol infusion, Midazolam, Ondansetron and Dexamethasone  Airway Management Planned: Nasal Cannula and Natural Airway  Additional Equipment:   Intra-op Plan:   Post-operative Plan:   Informed Consent: I have reviewed the patients History and Physical, chart, labs and discussed the procedure including the risks, benefits and alternatives for the proposed anesthesia with the patient or authorized representative who has indicated his/her understanding and acceptance.     Dental advisory given  Plan Discussed with: CRNA, Anesthesiologist and Surgeon  Anesthesia Plan Comments:          Anesthesia Quick Evaluation

## 2021-01-11 NOTE — Transfer of Care (Signed)
Immediate Anesthesia Transfer of Care Note  Patient: Haley Jenkins  Procedure(s) Performed: TOTAL KNEE ARTHROPLASTY (Right: Knee)  Patient Location: PACU  Anesthesia Type:Spinal  Level of Consciousness: awake, alert  and oriented  Airway & Oxygen Therapy: Patient Spontanous Breathing and Patient connected to face mask oxygen  Post-op Assessment: Report given to RN  Post vital signs: Reviewed and stable  Last Vitals:  Vitals Value Taken Time  BP 116/59 01/11/21 1610  Temp    Pulse 94 01/11/21 1613  Resp 26 01/11/21 1613  SpO2 92 % 01/11/21 1613  Vitals shown include unvalidated device data.  Last Pain:  Vitals:   01/11/21 1200  TempSrc:   PainSc: 3       Patients Stated Pain Goal: 4 ([id number])  Complications: No notable events documented.

## 2021-01-11 NOTE — Discharge Instructions (Signed)
Ice to the knee constantly.  Keep the incision covered and clean and dry for one week, then ok to get it wet in the shower.  Do exercise as instructed every hour, please to prevent stiffness.    DO NOT prop anything under the knee, it will make your knee stiff.  Prop under the ankle to encourage your knee to go straight.   Use the walker while you are up and around for balance.  Wear your support stockings 24/7 to prevent blood clots and take baby aspirin twice daily for 30 days also to prevent blood clots  Follow up with Dr Maxxwell Edgett in two weeks in the office, call [phone] for appt   INSTRUCTIONS AFTER JOINT REPLACEMENT   Remove items at home which could result in a fall. This includes throw rugs or furniture in walking pathways ICE to the affected joint every three hours while awake for 30 minutes at a time, for at least the first 3-5 days, and then as needed for pain and swelling.  Continue to use ice for pain and swelling. You may notice swelling that will progress down to the foot and ankle.  This is normal after surgery.  Elevate your leg when you are not up walking on it.   Continue to use the breathing machine you got in the hospital (incentive spirometer) which will help keep your temperature down.  It is common for your temperature to cycle up and down following surgery, especially at night when you are not up moving around and exerting yourself.  The breathing machine keeps your lungs expanded and your temperature down.   DIET:  As you were doing prior to hospitalization, we recommend a well-balanced diet.  DRESSING / WOUND CARE / SHOWERING  You may change your dressing 3-5 days after surgery.  Then change the dressing every day with sterile gauze.  Please use good hand washing techniques before changing the dressing.  Do not use any lotions or creams on the incision until instructed by your surgeon.  ACTIVITY  Increase activity slowly as tolerated, but follow the weight  bearing instructions below.   No driving for 6 weeks or until further direction given by your physician.  You cannot drive while taking narcotics.  No lifting or carrying greater than 10 lbs. until further directed by your surgeon. Avoid periods of inactivity such as sitting longer than an hour when not asleep. This helps prevent blood clots.  You may return to work once you are authorized by your doctor.     WEIGHT BEARING   Weight bearing as tolerated with assist device (walker, cane, etc) as directed, use it as long as suggested by your surgeon or therapist, typically at least 4-6 weeks.   EXERCISES  Results after joint replacement surgery are often greatly improved when you follow the exercise, range of motion and muscle strengthening exercises prescribed by your doctor. Safety measures are also important to protect the joint from further injury. Any time any of these exercises cause you to have increased pain or swelling, decrease what you are doing until you are comfortable again and then slowly increase them. If you have problems or questions, call your caregiver or physical therapist for advice.   Rehabilitation is important following a joint replacement. After just a few days of immobilization, the muscles of the leg can become weakened and shrink (atrophy).  These exercises are designed to build up the tone and strength of the thigh and leg muscles and to   improve motion. Often times heat used for twenty to thirty minutes before working out will loosen up your tissues and help with improving the range of motion but do not use heat for the first two weeks following surgery (sometimes heat can increase post-operative swelling).   These exercises can be done on a training (exercise) mat, on the floor, on a table or on a bed. Use whatever works the best and is most comfortable for you.    Use music or television while you are exercising so that the exercises are a pleasant break in your day.  This will make your life better with the exercises acting as a break in your routine that you can look forward to.   Perform all exercises about fifteen times, three times per day or as directed.  You should exercise both the operative leg and the other leg as well.  Exercises include:   Quad Sets - Tighten up the muscle on the front of the thigh (Quad) and hold for 5-10 seconds.   Straight Leg Raises - With your knee straight (if you were given a brace, keep it on), lift the leg to 60 degrees, hold for 3 seconds, and slowly lower the leg.  Perform this exercise against resistance later as your leg gets stronger.  Leg Slides: Lying on your back, slowly slide your foot toward your buttocks, bending your knee up off the floor (only go as far as is comfortable). Then slowly slide your foot back down until your leg is flat on the floor again.  Angel Wings: Lying on your back spread your legs to the side as far apart as you can without causing discomfort.  Hamstring Strength:  Lying on your back, push your heel against the floor with your leg straight by tightening up the muscles of your buttocks.  Repeat, but this time bend your knee to a comfortable angle, and push your heel against the floor.  You may put a pillow under the heel to make it more comfortable if necessary.   A rehabilitation program following joint replacement surgery can speed recovery and prevent re-injury in the future due to weakened muscles. Contact your doctor or a physical therapist for more information on knee rehabilitation.    CONSTIPATION  Constipation is defined medically as fewer than three stools per week and severe constipation as less than one stool per week.  Even if you have a regular bowel pattern at home, your normal regimen is likely to be disrupted due to multiple reasons following surgery.  Combination of anesthesia, postoperative narcotics, change in appetite and fluid intake all can affect your bowels.   YOU MUST  use at least one of the following options; they are listed in order of increasing strength to get the job done.  They are all available over the counter, and you may need to use some, POSSIBLY even all of these options:    Drink plenty of fluids (prune juice may be helpful) and high fiber foods Colace 100 mg by mouth twice a day  Senokot for constipation as directed and as needed Dulcolax (bisacodyl), take with full glass of water  Miralax (polyethylene glycol) once or twice a day as needed.  If you have tried all these things and are unable to have a bowel movement in the first 3-4 days after surgery call either your surgeon or your primary doctor.    If you experience loose stools or diarrhea, hold the medications until you stool forms back   up.  If your symptoms do not get better within 1 week or if they get worse, check with your doctor.  If you experience "the worst abdominal pain ever" or develop nausea or vomiting, please contact the office immediately for further recommendations for treatment.   ITCHING:  If you experience itching with your medications, try taking only a single pain pill, or even half a pain pill at a time.  You can also use Benadryl over the counter for itching or also to help with sleep.   TED HOSE STOCKINGS:  Use stockings on both legs until for at least 2 weeks or as directed by physician office. They may be removed at night for sleeping.  MEDICATIONS:  See your medication summary on the "After Visit Summary" that nursing will review with you.  You may have some home medications which will be placed on hold until you complete the course of blood thinner medication.  It is important for you to complete the blood thinner medication as prescribed.  PRECAUTIONS:  If you experience chest pain or shortness of breath - call 911 immediately for transfer to the hospital emergency department.   If you develop a fever greater that 101 F, purulent drainage from wound, increased  redness or drainage from wound, foul odor from the wound/dressing, or calf pain - CONTACT YOUR SURGEON.                                                   FOLLOW-UP APPOINTMENTS:  If you do not already have a post-op appointment, please call the office for an appointment to be seen by your surgeon.  Guidelines for how soon to be seen are listed in your "After Visit Summary", but are typically between 1-4 weeks after surgery.  OTHER INSTRUCTIONS:   Knee Replacement:  Do not place pillow under knee, focus on keeping the knee straight while resting. CPM instructions: 0-90 degrees, 2 hours in the morning, 2 hours in the afternoon, and 2 hours in the evening. Place foam block, curve side up under heel at all times except when in CPM or when walking.  DO NOT modify, tear, cut, or change the foam block in any way.  POST-OPERATIVE OPIOID TAPER INSTRUCTIONS: It is important to wean off of your opioid medication as soon as possible. If you do not need pain medication after your surgery it is ok to stop day one. Opioids include: Codeine, Hydrocodone(Norco, Vicodin), Oxycodone(Percocet, oxycontin) and hydromorphone amongst others.  Long term and even short term use of opiods can cause: Increased pain response Dependence Constipation Depression Respiratory depression And more.  Withdrawal symptoms can include Flu like symptoms Nausea, vomiting And more Techniques to manage these symptoms Hydrate well Eat regular healthy meals Stay active Use relaxation techniques(deep breathing, meditating, yoga) Do Not substitute Alcohol to help with tapering If you have been on opioids for less than two weeks and do not have pain than it is ok to stop all together.  Plan to wean off of opioids This plan should start within one week post op of your joint replacement. Maintain the same interval or time between taking each dose and first decrease the dose.  Cut the total daily intake of opioids by one tablet each  day Next start to increase the time between doses. The last dose that should be eliminated is   the evening dose.   MAKE SURE YOU:  Understand these instructions.  Get help right away if you are not doing well or get worse.    Thank you for letting us be a part of your medical care team.  It is a privilege we respect greatly.  We hope these instructions will help you stay on track for a fast and full recovery!      

## 2021-01-11 NOTE — Progress Notes (Signed)
Assisted Dr. Turk with right, ultrasound guided, adductor canal block. Side rails up, monitors on throughout procedure. See vital signs in flow sheet. Tolerated Procedure well.  

## 2021-01-11 NOTE — Plan of Care (Signed)
  Problem: Health Behavior/Discharge Planning: Goal: Ability to manage health-related needs will improve Outcome: Progressing   Problem: Clinical Measurements: Goal: Ability to maintain clinical measurements within normal limits will improve Outcome: Progressing   Problem: Activity: Goal: Risk for activity intolerance will decrease Outcome: Progressing   Problem: Nutrition: Goal: Adequate nutrition will be maintained Outcome: Progressing   Problem: Elimination: Goal: Will not experience complications related to bowel motility Outcome: Progressing   Problem: Pain Managment: Goal: General experience of comfort will improve Outcome: Progressing   Problem: Safety: Goal: Ability to remain free from injury will improve Outcome: Progressing   

## 2021-01-11 NOTE — Interval H&P Note (Signed)
History and Physical Interval Note:  01/11/2021 1:05 PM  Haley Jenkins  has presented today for surgery, with the diagnosis of right knee end stage osteoarthritis.  The various methods of treatment have been discussed with the patient and family. After consideration of risks, benefits and other options for treatment, the patient has consented to  Procedure(s) with comments: TOTAL KNEE ARTHROPLASTY (Right) - with block as a surgical intervention.  The patient's history has been reviewed, patient examined, no change in status, stable for surgery.  I have reviewed the patient's chart and labs.  Questions were answered to the patient's satisfaction.     Augustin Schooling

## 2021-01-11 NOTE — Anesthesia Postprocedure Evaluation (Signed)
Anesthesia Post Note  Patient: Haley Jenkins  Procedure(s) Performed: TOTAL KNEE ARTHROPLASTY (Right: Knee)     Patient location during evaluation: PACU Anesthesia Type: Spinal Level of consciousness: oriented and awake and alert Pain management: pain level controlled Vital Signs Assessment: post-procedure vital signs reviewed and stable Respiratory status: spontaneous breathing, respiratory function stable and patient connected to nasal cannula oxygen Cardiovascular status: blood pressure returned to baseline and stable Postop Assessment: no headache, no backache, no apparent nausea or vomiting, spinal receding and patient able to bend at knees Anesthetic complications: no   No notable events documented.  Last Vitals:  Vitals:   01/11/21 1615 01/11/21 1731  BP:  135/81  Pulse:  82  Resp:    Temp: 36.5 C 36.4 C  SpO2:  94%    Last Pain:  Vitals:   01/11/21 1731  TempSrc: Oral  PainSc:                  Catalina Gravel

## 2021-01-11 NOTE — Anesthesia Procedure Notes (Signed)
Spinal  Start time: 01/11/2021 2:05 PM End time: 01/11/2021 2:11 PM Staffing Performed: anesthesiologist  Anesthesiologist: Catalina Gravel, MD Resident/CRNA: Gwyndolyn Saxon, CRNA Preanesthetic Checklist Completed: patient identified, IV checked, site marked, risks and benefits discussed, surgical consent, monitors and equipment checked, pre-op evaluation and timeout performed Spinal Block Patient position: sitting Prep: DuraPrep Patient monitoring: heart rate, continuous pulse ox and blood pressure Approach: midline Location: L3-4 Injection technique: single-shot Needle Needle type: Pencan  Needle gauge: 24 G Needle length: 10 cm Assessment Sensory level: T6 Events: CSF return

## 2021-01-11 NOTE — Anesthesia Procedure Notes (Signed)
Spinal  Patient location during procedure: OR Start time: 01/11/2021 2:00 PM End time: 01/11/2021 2:11 PM Reason for block: surgical anesthesia Staffing Performed: anesthesiologist  Anesthesiologist: Catalina Gravel, MD Preanesthetic Checklist Completed: patient identified, IV checked, risks and benefits discussed, surgical consent, monitors and equipment checked, pre-op evaluation and timeout performed Spinal Block Patient position: sitting Prep: DuraPrep and site prepped and draped Patient monitoring: continuous pulse ox and blood pressure Approach: midline Location: L3-4 Injection technique: single-shot Needle Needle type: Pencan  Needle gauge: 24 G Assessment Events: CSF return and second provider Additional Notes Functioning IV was confirmed and monitors were applied. Sterile prep and drape, including hand hygiene, mask and sterile gloves were used. The patient was positioned and the spine was prepped. The skin was anesthetized with lidocaine.  Free flow of clear CSF was obtained prior to injecting local anesthetic into the CSF.  The spinal needle aspirated freely following injection.  The needle was carefully withdrawn.  The patient tolerated the procedure well. Consent was obtained prior to procedure with all questions answered and concerns addressed. Risks including but not limited to bleeding, infection, nerve damage, paralysis, failed block, inadequate analgesia, allergic reaction, high spinal, itching and headache were discussed and the patient wished to proceed.   Hoy Morn, MD

## 2021-01-12 DIAGNOSIS — M1711 Unilateral primary osteoarthritis, right knee: Secondary | ICD-10-CM | POA: Diagnosis not present

## 2021-01-12 LAB — BASIC METABOLIC PANEL
Anion gap: 8 (ref 5–15)
BUN: 17 mg/dL (ref 8–23)
CO2: 26 mmol/L (ref 22–32)
Calcium: 9.3 mg/dL (ref 8.9–10.3)
Chloride: 105 mmol/L (ref 98–111)
Creatinine, Ser: 1.07 mg/dL — ABNORMAL HIGH (ref 0.44–1.00)
GFR, Estimated: 57 mL/min — ABNORMAL LOW (ref >60)
Glucose, Bld: 213 mg/dL — ABNORMAL HIGH (ref 70–99)
Potassium: 3.8 mmol/L (ref 3.5–5.1)
Sodium: 139 mmol/L (ref 135–145)

## 2021-01-12 LAB — CBC
HCT: 38.2 % (ref 36.0–46.0)
Hemoglobin: 13.1 g/dL (ref 12.0–15.0)
MCH: 31.2 pg (ref 26.0–34.0)
MCHC: 34.3 g/dL (ref 30.0–36.0)
MCV: 91 fL (ref 80.0–100.0)
Platelets: 215 10*3/uL (ref 150–400)
RBC: 4.2 MIL/uL (ref 3.87–5.11)
RDW: 12.9 % (ref 11.5–15.5)
WBC: 12 10*3/uL — ABNORMAL HIGH (ref 4.0–10.5)
nRBC: 0 % (ref 0.0–0.2)

## 2021-01-12 NOTE — Evaluation (Signed)
Physical Therapy Evaluation Patient Details Name: Haley Jenkins MRN: [id number] DOB: [birth year] Today's Date: 01/12/2021   History of Present Illness  Pt is a 70 y.o. female s/p Rt TKA on 9/922. PMH significant for DM, GERD, HTN, hypothyroidism, and L knee surgery (1976).  Clinical Impression  Pt is POD #1 s/p RtTKA resulting in the deficits listed below (see PT Problem List). Pt performed sit to stand transfers with MIN guard for safety and cues for safe hand placement. Pt ambulated total of ~60f with MIN guard progressing to supervision for safety. Cues provided for step to gait patten with no LOB observed. Pt's husband demonstrated safe guarding position for mobility following cues from therapist. PT encouraged continued performance of LE interventions for promotion of DVT prevention and improved strength/ROM, pt demonstrated understanding. Pt will have assist from her husband upon d/c. Pt will benefit from skilled PT to maximize functional mobility to increase independence and for stair training to ensure safety upon d/c.      Follow Up Recommendations Follow surgeon's recommendation for DC plan and follow-up therapies;Supervision for mobility/OOB    Equipment Recommendations  None recommended by PT (pt owns RW)    Recommendations for Other Services       Precautions / Restrictions Precautions Precautions: Fall Restrictions Weight Bearing Restrictions: No RLE Weight Bearing: Weight bearing as tolerated      Mobility  Bed Mobility               General bed mobility comments: Pt OOB in recliner upon entry    Transfers Overall transfer level: Needs assistance Equipment used: Rolling walker (2 wheeled) Transfers: Sit to/from Stand Sit to Stand: Min guard         General transfer comment: Review of WBAT status prior to mobility. Min guard for safety with cues for safe hand placement  Ambulation/Gait Ambulation/Gait assistance: Min guard;Supervision Gait  Distance (Feet): 60 Feet Assistive device: Rolling walker (2 wheeled) Gait Pattern/deviations: Step-to pattern;Decreased stride length;Decreased weight shift to right Gait velocity: decr   General Gait Details: MIN guard progressing to supervision for safety with cues for step to gait pattern, no LOB observed. Pt's husband demonstrated safe guarding position following cues from therapist. Intermittent spells of dizziness reported with onset of nausea following ambulation, vitals 130/643mg, 109bpm, 95% O2 on RA. Resolution of symtoms with seated rest.  Stairs            Wheelchair Mobility    Modified Rankin (Stroke Patients Only)       Balance Overall balance assessment: Needs assistance Sitting-balance support: Feet supported Sitting balance-Leahy Scale: Good     Standing balance support: Bilateral upper extremity supported;During functional activity Standing balance-Leahy Scale: Fair Standing balance comment: pt able to maintain static standing balance without use of RW, required use of for maintenance of balance with ambulation                             Pertinent Vitals/Pain Pain Assessment: 0-10 Pain Score: 4  Pain Location: Rt knee Pain Descriptors / Indicators: Burning Pain Intervention(s): Limited activity within patient's tolerance;Monitored during session;Repositioned    Home Living Family/patient expects to be discharged to:: Private residence Living Arrangements: Spouse/significant other Available Help at Discharge: Family Type of Home: House Home Access: Stairs to enter Entrance Stairs-Rails: None Entrance Stairs-Number of Steps: 1 curb step Home Layout: Multi-level;Able to live on main level with bedroom/bathroom;Full bath on main level Home Equipment: Cane -  single point;Walker - 2 wheels Additional Comments: normally stays on 2nd floor.plan to stay on 1st floor upon d/c. lives with usband and daughter can help intermittently if needed.     Prior Function Level of Independence: Independent with assistive device(s)         Comments: use of SPC when having increased pain     Hand Dominance   Dominant Hand: Right    Extremity/Trunk Assessment   Upper Extremity Assessment Upper Extremity Assessment: Overall WFL for tasks assessed    Lower Extremity Assessment Lower Extremity Assessment: Generalized weakness    Cervical / Trunk Assessment Cervical / Trunk Assessment: Normal  Communication   Communication: No difficulties  Cognition Arousal/Alertness: Awake/alert Behavior During Therapy: WFL for tasks assessed/performed Overall Cognitive Status: Within Functional Limits for tasks assessed                                        General Comments      Exercises     Assessment/Plan    PT Assessment Patient needs continued PT services  PT Problem List Decreased strength;Decreased range of motion;Decreased activity tolerance;Decreased balance;Decreased mobility;Decreased knowledge of use of DME;Pain       PT Treatment Interventions DME instruction;Gait training;Stair training;Functional mobility training;Therapeutic activities;Therapeutic exercise;Balance training;Patient/family education    PT Goals (Current goals can be found in the Care Plan section)  Acute Rehab PT Goals Patient Stated Goal: Go home and recover quickly PT Goal Formulation: With patient/family Time For Goal Achievement: 01/26/21 Potential to Achieve Goals: Good    Frequency 7X/week   Barriers to discharge        Co-evaluation               AM-PAC PT "6 Clicks" Mobility  Outcome Measure Help needed turning from your back to your side while in a flat bed without using bedrails?: A Little Help needed moving from lying on your back to sitting on the side of a flat bed without using bedrails?: A Little Help needed moving to and from a bed to a chair (including a wheelchair)?: A Little Help needed standing up  from a chair using your arms (e.g., wheelchair or bedside chair)?: A Little Help needed to walk in hospital room?: A Little Help needed climbing 3-5 steps with a railing? : A Lot 6 Click Score: 17    End of Session Equipment Utilized During Treatment: Gait belt Activity Tolerance: Patient tolerated treatment well Patient left: in chair;with call bell/phone within reach;with chair alarm set;with family/visitor present Nurse Communication: Mobility status PT Visit Diagnosis: Unsteadiness on feet (R26.81);Muscle weakness (generalized) (M62.81);Pain Pain - Right/Left: Right Pain - part of body: Knee    Time: 1229-1259 PT Time Calculation (min) (ACUTE ONLY): 30 min   Charges:   PT Evaluation $PT Eval Low Complexity: 1 Low PT Treatments $Therapeutic Activity: 8-22 mins        Festus Barren PT, DPT  Acute Rehabilitation Services  Office [phone]  01/12/2021, 1:31 PM

## 2021-01-12 NOTE — Op Note (Signed)
NAMEBREINDY, Haley Jenkins MEDICAL RECORD NO: [id number] ACCOUNT NO: [id number] DATE OF BIRTH: [birth year] FACILITY: [facility] LOCATION: [facility] PHYSICIAN: Doran Heater. Veverly Fells, MD  Operative Report   DATE OF PROCEDURE: 01/11/2021  PREOPERATIVE DIAGNOSIS:  Right knee end-stage arthritis.  POSTOPERATIVE DIAGNOSIS:  Right knee end-stage arthritis.  PROCEDURE PERFORMED:  Right total knee replacement using DePuy Attune prosthesis.  ATTENDING SURGEON:  Doran Heater. Veverly Fells, MD  ASSISTANT:  Charletta Cousin [place], [place], who was scrubbed during the entire procedure and necessary for satisfactory completion of surgery.  ANESTHESIA:  Spinal anesthesia plus adductor canal block was used.  ESTIMATED BLOOD LOSS:  Minimal.  FLUID REPLACEMENT:  1500 mL crystalloid.  TOURNIQUET TIME:  1 hour and 20 minutes at 325 mmHg.  COUNTS: Instrument counts were correct.  COMPLICATIONS: No complications.  ANTIBIOTICS:  Perioperative antibiotics were given.  INDICATIONS:  The patient is a 70 year old female who presents with a history of worsening right knee pain secondary to bone-on-bone arthritis.  The patient has had progressive pain despite conservative management, presents for operative treatment to  restore function and eliminate pain.  Informed consent obtained.  DESCRIPTION OF PROCEDURE:  After an adequate level of anesthesia was achieved, the patient was positioned supine on the operating room table.  Right leg correctly identified. A nonsterile tourniquet placed in the proximal thigh.  Right leg sterilely  prepped and draped in the usual manner.  Timeout called, verifying correct patient, correct site.  We elevated the leg and exsanguinated with an Esmarch bandage, elevating the tourniquet to 325 mmHg.  We placed the knee in flexion, performed a  longitudinal midline incision with a 10 blade scalpel.  Dissection down through subcutaneous tissues using the 10 blade.  We identified the medial parapatellar tissues and  performed a parapatellar arthrotomy with a fresh 10 blade, dividing lateral  patellofemoral ligaments and everting the patella, exposing the distal femur, which was devoid of cartilage.  At this point, we entered the distal femur with a step cut drill and then placed our intramedullary guide set on 5 degrees right.  We resected  11 mm as the patient had a flexion contracture. After a distal cut, we sized the femur to a size 6 anterior down, performed anterior, posterior chamfer cuts with a 4-in-1 block.  We then moved the tibia subluxing anteriorly and performing our tibial cut  90 degrees perpendicular to the long axis of the tibia with minimal posterior slope for this posterior cruciate substituting prosthesis.  ACL and PCL, meniscal tissues were removed.  We then used a lamina spreader to remove posterior femoral condyle  osteophytes.  Once we had that performed, we checked our gaps, which were symmetric at 6 mm, both in flexion and extension.  We finished our tibial preparation with modular drill and keel punch for the size 5 tibia.  We externally rotated the tibial  component relatively internally rotating the tibial tubercle for patellar tracking purposes.  We then used our saw to cut the box using the box cut guide for the 6 right femur.  We then placed our trial 6 right selecting a narrow component for the real  one.  We drilled our lug holes and then trialed with a 6 mm insert.  We placed the knee in extension.  We felt like we would probably get a little bit thicker insert in, but we then resurfaced the patella going from 26 mm thickness down to 16.  We  drilled the lug holes for the 38 patellar button and  then ranged the knee with the patellar button in place with excellent patellar tracking, no-touch technique.  We removed all trial components, irrigated thoroughly and then vacuum mixed high viscosity  cement on the back table.  We then cemented the components into place.  It was a 5 tibia, 6  right narrow femur with lugs and then the 38 patellar button.  We reduced it with a 6 mm poly insert, size 6 and then after we had the knee in extension for set  time on the cement.  Once the cement was hardened on the back table, we removed excess cement with quarter-inch curved osteotome.  We did a final look in the back of the knee.  I did inject the back of the knee and all the capsule in the suprapatellar  pouch area and down the medial and lateral gutters with a combination of Marcaine, Exparel and saline.  Once we selected a size 8 and trial that, we went up with the 8 mm size 6 polyethylene final insert and placed it on the tibial tray and reduced the  knee.  We had a nice little pop as it reduced on the medial femoral condyle.  We were stable in flexion and extension.  At this point, we irrigated thoroughly and then closed the parapatellar arthrotomy with #1 Vicryl suture, followed by 2-0 Vicryl for subcutaneous closure and 4-0  Monocryl for skin.  Steri-Strips were applied followed by sterile dressing and knee immobilizer.  The patient was awakened and taken to recovery room in stable condition, having tolerated surgery well.   NIK D: 01/11/2021 3:59:33 pm T: 01/12/2021 2:40:00 am  JOB: [id number]

## 2021-01-12 NOTE — Progress Notes (Signed)
Placed purewick on patient during the night shift due to having no sensation in her buttocks. In addition, patient wanted to the [place] in order to get sleep during the evening. Will remove purewick in the morning.

## 2021-01-12 NOTE — Progress Notes (Signed)
   Subjective:  Patient reports pain as mild.  Good night overnight.  Has had 1 physical therapy session.  Doing well.  Denies fevers, night sweats or chills.  Denies chest pain or shortness of breath.  Objective:   VITALS:   Vitals:   01/11/21 1934 01/11/21 2157 01/12/21 0114 01/12/21 0529  BP: 139/67 (!) 166/81 (!) 141/66 (!) 144/73  Pulse: 95 99 (!) 105 (!) 102  Resp: '16 16 16 16  '$ Temp: (!) 97.4 F (36.3 C) 98.3 F (36.8 C) 97.8 F (36.6 C) 99.1 F (37.3 C)  TempSrc: Oral Oral Oral Oral  SpO2: 95% 96% 95% 96%  Weight:      Height:        Neurologically intact Neurovascular intact Sensation intact distally Dorsiflexion/Plantar flexion intact Incision: dressing C/D/I and no drainage Compartment soft No pain with passive stretch.  No signs of DVT.  Lab Results  Component Value Date   WBC 12.0 (H) 01/12/2021   HGB 13.1 01/12/2021   HCT 38.2 01/12/2021   MCV 91.0 01/12/2021   PLT 215 01/12/2021   BMET    Component Value Date/Time   NA 139 01/12/2021 0322   K 3.8 01/12/2021 0322   CL 105 01/12/2021 0322   CO2 26 01/12/2021 0322   GLUCOSE 213 (H) 01/12/2021 0322   BUN 17 01/12/2021 0322   CREATININE 1.07 (H) 01/12/2021 0322   CALCIUM 9.3 01/12/2021 0322   GFRNONAA 57 (L) 01/12/2021 0322   GFRAA >60 12/05/2016 0935     Assessment/Plan: 1 Day Post-Op   Active Problems:   Status post total knee replacement, right   Up with therapy -Up with therapy today.  We will continue to monitor for progress.  We discussed likely 1 more night in the hospital versus potentially discharge home Monday.  I anticipate she will be able to call tomorrow.  -Continue weightbearing as tolerated.  -Dressing change on Sunday before discharge to an Aquacel.  -Continue twice daily 81 mg aspirin for DVT prophylaxis.  Continue SCDs while in bed.   Nicholes Stairs 01/12/2021, 8:01 AM   Geralynn Rile, MD [phone]

## 2021-01-13 DIAGNOSIS — M1711 Unilateral primary osteoarthritis, right knee: Secondary | ICD-10-CM | POA: Diagnosis not present

## 2021-01-13 LAB — CBC
HCT: 36.3 % (ref 36.0–46.0)
Hemoglobin: 12.4 g/dL (ref 12.0–15.0)
MCH: 31.2 pg (ref 26.0–34.0)
MCHC: 34.2 g/dL (ref 30.0–36.0)
MCV: 91.2 fL (ref 80.0–100.0)
Platelets: 166 10*3/uL (ref 150–400)
RBC: 3.98 MIL/uL (ref 3.87–5.11)
RDW: 13.6 % (ref 11.5–15.5)
WBC: 8.5 10*3/uL (ref 4.0–10.5)
nRBC: 0 % (ref 0.0–0.2)

## 2021-01-13 MED ORDER — ACETAMINOPHEN 325 MG PO TABS: 325.0000 mg | ORAL_TABLET | Freq: Four times a day (QID) | ORAL | Status: DC | PRN

## 2021-01-13 MED ORDER — HYDROMORPHONE HCL 2 MG PO TABS: 2.0000 mg | ORAL_TABLET | ORAL | 0 refills | Status: DC | PRN

## 2021-01-13 MED ORDER — CYCLOBENZAPRINE HCL 5 MG PO TABS: 5.0000 mg | ORAL_TABLET | Freq: Three times a day (TID) | ORAL | 0 refills | Status: AC | PRN

## 2021-01-13 NOTE — Progress Notes (Signed)
Physical Therapy Treatment Patient Details Name: Haley Jenkins MRN: [id number] DOB: [birth year] Today's Date: 01/13/2021    History of Present Illness Pt is a 70 y.o. female s/p Rt TKA on 9/922. PMH significant for DM, GERD, HTN, hypothyroidism, and L knee surgery (1976).    PT Comments    Pt is progressing toward acute PT goals this session with progression to stair training. Pt ambulated ~34f with supervision provided for safety, no LOB observed. Pt performed safe stair negotiation with MIN A for RW stability and cues for sequencing and safe hand placement. Pt's husband demonstrated safe guarding technique for all mobility following cues from therapist. Pt was able to verbalize correct sequencing pattern for stair negotiation. PT reviewed LE HEP, pt demonstrated understanding. Pt is currently at mobility level that is safe for d/c to home and will have assist from her husband. Pt will benefit from continued skilled PT to increase independence and maximize safety with mobility.     Follow Up Recommendations  Follow surgeon's recommendation for DC plan and follow-up therapies;Supervision for mobility/OOB     Equipment Recommendations  None recommended by PT (pt owns RW)    Recommendations for Other Services       Precautions / Restrictions Precautions Precautions: Fall Restrictions Weight Bearing Restrictions: No RLE Weight Bearing: Weight bearing as tolerated    Mobility  Bed Mobility Overal bed mobility: Needs Assistance Bed Mobility: Supine to Sit     Supine to sit: Supervision;HOB elevated     General bed mobility comments: Supervision for safety with increased time, pt with use of UE to assist with bringing Rt LE to EOB due to pain    Transfers Overall transfer level: Needs assistance Equipment used: Rolling walker (2 wheeled) Transfers: Sit to/from Stand Sit to Stand: Supervision         General transfer comment: Review of WBAT status prior to mobility.  supervision for safety, pt demonstrated good carryover of safe hand placement from yesterday's therapy session.  Ambulation/Gait Ambulation/Gait assistance: Supervision Gait Distance (Feet): 60 Feet Assistive device: Rolling walker (2 wheeled) Gait Pattern/deviations: Step-to pattern;Decreased stride length;Decreased weight shift to right Gait velocity: decr   General Gait Details: Supervision provided for safety with cues for step to gait pattern, no LOB observed. Pt's husband demonstrated safe guarding position following cues from therapist. Pt with x2 reports of nausea onset during ambulation which resolved.   Stairs Stairs: Yes Stairs assistance: Min assist Stair Management: No rails;Forwards;With walker Number of Stairs: 2 General stair comments: MIN A provided for RW stability with MIN guard for safety. Cues provided for sequencing "up with the good, down with the bad." Pt's husband demonstrated safe guarding technique following cues from therapist. Pt was able to verbalize correct sequencing and guarding position for family members on 2nd stair training trial.   Wheelchair Mobility    Modified Rankin (Stroke Patients Only)       Balance Overall balance assessment: Needs assistance Sitting-balance support: Feet supported Sitting balance-Leahy Scale: Good     Standing balance support: Bilateral upper extremity supported;During functional activity Standing balance-Leahy Scale: Fair Standing balance comment: pt able to maintain static standing balance without use of RW, required use of RW for maintenance of balance with ambulation                            Cognition Arousal/Alertness: Awake/alert Behavior During Therapy: WFL for tasks assessed/performed Overall Cognitive Status: Within Functional Limits for tasks  assessed                                        Exercises Total Joint Exercises Ankle Circles/Pumps: AROM;Both;5  reps;Seated Quad Sets: AROM;Right;5 reps;Seated Short Arc Quad: AROM;Right;5 reps;Seated Heel Slides: AAROM;Right;5 reps;Seated Hip ABduction/ADduction: AROM;Right;5 reps;Seated Straight Leg Raises: AAROM;Right;5 reps;Seated Long Arc Quad: AROM;Right;5 reps;Seated (decreased ROM due to pain)    General Comments        Pertinent Vitals/Pain Pain Assessment: 0-10 Pain Score: 5  Pain Location: Rt knee Pain Descriptors / Indicators: Sore;Tender Pain Intervention(s): Limited activity within patient's tolerance;Monitored during session;Ice applied;Repositioned    Home Living                      Prior Function            PT Goals (current goals can now be found in the care plan section) Acute Rehab PT Goals Patient Stated Goal: Go home and recover quickly PT Goal Formulation: With patient/family Time For Goal Achievement: 01/26/21 Potential to Achieve Goals: Good Progress towards PT goals: Progressing toward goals    Frequency    7X/week      PT Plan Current plan remains appropriate    Co-evaluation              AM-PAC PT "6 Clicks" Mobility   Outcome Measure  Help needed turning from your back to your side while in a flat bed without using bedrails?: A Little Help needed moving from lying on your back to sitting on the side of a flat bed without using bedrails?: A Little Help needed moving to and from a bed to a chair (including a wheelchair)?: A Little Help needed standing up from a chair using your arms (e.g., wheelchair or bedside chair)?: A Little Help needed to walk in hospital room?: A Little Help needed climbing 3-5 steps with a railing? : A Little 6 Click Score: 18    End of Session Equipment Utilized During Treatment: Gait belt Activity Tolerance: Patient tolerated treatment well Patient left: in chair;with call bell/phone within reach;with chair alarm set;with family/visitor present Nurse Communication: Mobility status PT Visit  Diagnosis: Unsteadiness on feet (R26.81);Muscle weakness (generalized) (M62.81);Pain Pain - Right/Left: Right Pain - part of body: Knee     Time: [id number] PT Time Calculation (min) (ACUTE ONLY): 31 min  Charges:  $Gait Training: 8-22 mins $Therapeutic Exercise: 8-22 mins                     Festus Barren PT, DPT  Acute Rehabilitation Services  Office [phone]  01/13/2021, 9:21 AM

## 2021-01-13 NOTE — Progress Notes (Signed)
Subjective: 2 Days Post-Op Procedure(s) (LRB): TOTAL KNEE ARTHROPLASTY (Right) Patient reports pain as mild.   Patient seen in rounds for Dr. Veverly Fells. Patient is well, and has had no acute complaints or problems. No acute events overnight. Voiding without difficulty. Patient ambulated 60 feet with PT. She reports she is ready to get home today. We will continue therapy today.   Objective: Vital signs in last 24 hours: Temp:  [98.6 F (37 C)-102.2 F (39 C)] 100.3 F (37.9 C) (09/11 0623) Pulse Rate:  [85-115] 109 (09/11 0623) Resp:  [16-20] 16 (09/11 0623) BP: (135-145)/(63-87) 145/79 (09/11 0623) SpO2:  [88 %-97 %] 92 % (09/11 0623)  Intake/Output from previous day:  Intake/Output Summary (Last 24 hours) at 01/13/2021 0925 Last data filed at 01/13/2021 0600 Gross per 24 hour  Intake 1349.95 ml  Output --  Net 1349.95 ml     Intake/Output this shift: No intake/output data recorded.  Labs: Recent Labs    01/12/21 0322 01/13/21 0325  HGB 13.1 12.4   Recent Labs    01/12/21 0322 01/13/21 0325  WBC 12.0* 8.5  RBC 4.20 3.98  HCT 38.2 36.3  PLT 215 166   Recent Labs    01/12/21 0322  NA 139  K 3.8  CL 105  CO2 26  BUN 17  CREATININE 1.07*  GLUCOSE 213*  CALCIUM 9.3   No results for input(s): LABPT, INR in the last 72 hours.  Exam: General - Patient is Alert and Oriented Extremity - Neurologically intact Sensation intact distally Intact pulses distally Dorsiflexion/Plantar flexion intact Dressing - dressing C/D/I Motor Function - intact, moving foot and toes well on exam.   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   Diabetes mellitus without complication ([place])    Family history of breast cancer    Family history of colon cancer    Family history of ovarian cancer    Fracture of knee region 10/2014    Bilateral    Gastric polyp    Gastroparesis    GERD (gastroesophageal reflux disease)    Hepatic steatosis 05/09/2019   By ultrasound 04/2019   Hiatal hernia    History of kidney stones    Hypertension    Hypothyroidism    Irritable bowel syndrome    Lactose intolerance    RSD (reflex sympathetic dystrophy)     Assessment/Plan: 2 Days Post-Op Procedure(s) (LRB): TOTAL KNEE ARTHROPLASTY (Right) Active Problems:   Status post total knee replacement, right  Estimated body mass index is 33.68 kg/m as calculated from the following:   Height as of this encounter: 5' 7.5" (1.715 m).   Weight as of this encounter: 99 kg. Advance diet Up with therapy D/C IV fluids   Patient's anticipated LOS is less than 2 midnights, meeting these requirements: - Younger than 31 - Lives within 1 hour of care - Has a competent adult at home to recover with post-op recover - NO history of  - Chronic pain requiring opiods  - Diabetes  - Coronary Artery Disease  - Heart failure  - Heart attack  - Stroke  - DVT/VTE  - Cardiac arrhythmia  - Respiratory Failure/COPD  - Renal failure  - Anemia  - Advanced Liver disease     DVT Prophylaxis - Aspirin Weight bearing as  tolerated.  Plan is to go Home after hospital stay. Plan for discharge today following 1-2 sessions of PT as long as they are meeting their goals. Patient is scheduled for OPPT. Follow up in the office in 2 weeks.   We did change her pain medications here, and she reports she is doing better with Dilaudid in terms of nausea. She also felt the robaxin made her sick, but reports doing well with flexeril in the past.   Griffith Citron, PA-C Orthopedic Surgery [phone] 01/13/2021, 9:25 AM

## 2021-01-13 NOTE — TOC Transition Note (Signed)
Transition of Care [facility]) - CM/SW Discharge Note   Patient Details  Name: Haley Jenkins MRN: [id number] Date of Birth: December 31, 1950  Transition of Care [facility]) CM/SW Contact:  Illene Regulus, LCSW Phone Number: 01/13/2021, 2:10 PM   Clinical Narrative:     [name] was consulted on pt, after a review of chart, PT has not recommended HH , pt has a walker and will be d/c home to husband. No further TOC needs at this time. TOC sign off.     Barriers to Discharge: No Barriers Identified   Patient Goals and CMS Choice        Discharge Placement                       Discharge Plan and Services                                     Social Determinants of Health (SDOH) Interventions     Readmission Risk Interventions No flowsheet data found.

## 2021-01-14 NOTE — Progress Notes (Addendum)
   01/12/21 1847  Assess: MEWS Score  Temp (!) 102.2 F (39 C)  BP (!) 145/79  Pulse Rate 100  Resp 18  Level of Consciousness Alert  SpO2 96 %  O2 Device Nasal Cannula  Patient Activity (if Appropriate) In bed  O2 Flow Rate (L/min) 2 L/min  Assess: MEWS Score  MEWS Temp 2  MEWS Systolic 0  MEWS Pulse 0  MEWS RR 0  MEWS LOC 0  MEWS Score 2  MEWS Score Color Yellow  Assess: if the MEWS score is Yellow or Red  Were vital signs taken at a resting state? Yes  Focused Assessment No change from prior assessment  Does the patient meet 2 or more of the SIRS criteria? No  MEWS guidelines implemented *See Row Information* Yes  Treat  MEWS Interventions Other (Comment);Administered scheduled meds/treatments (md notified and no new orders obtained)  Pain Scale 0-10  Pain Score 0  Take Vital Signs  Increase Vital Sign Frequency  Yellow: Q 2hr X 2 then Q 4hr X 2, if remains yellow, continue Q 4hrs  Escalate  MEWS: Escalate Yellow: discuss with charge nurse/RN and consider discussing with provider and RRT  Notify: Charge Nurse/RN  Name of Charge Nurse/RN Notified Dani RN discussed with Geoffry Paradise RN  Date Charge Nurse/RN Notified 01/12/21  Time Charge Nurse/RN Notified 1847  Notify: Provider  Provider Name/Title Dr. Victorino December  Date Provider Notified 01/12/21  Time Provider Notified 1847  Notification Type Call  Notification Reason Change in status (Pt temp 102.2 ax, sats 87% on room air placed on 2L . Mews yellow)  Provider response No new orders;Other (Comment) (Give tylenol as ordered, do ICS)  Date of Provider Response 01/12/21  Time of Provider Response 1847  Document  Patient Outcome Other (Comment) (will monitor pt, no new orders obtained)  Assess: SIRS CRITERIA  SIRS Temperature  1  SIRS Pulse 1  SIRS Respirations  0  SIRS WBC 0  SIRS Score Sum  2   Pt suddenly spiked a fever of 102.2 this afternoon. Pt stable with fever.No signs of distress. Darden Dates Rn  discussed pt case with Merla Riches. Md was called and no new orders were obtained. Pt was encouraged to cough, deep breath, and use incentive spirometer.

## 2021-01-16 ENCOUNTER — Ambulatory Visit: Payer: Medicare Other | Attending: Physician Assistant | Admitting: Physical Therapy

## 2021-01-16 ENCOUNTER — Encounter (HOSPITAL_COMMUNITY): Payer: Self-pay | Admitting: Orthopedic Surgery

## 2021-01-16 ENCOUNTER — Other Ambulatory Visit: Payer: Self-pay

## 2021-01-16 DIAGNOSIS — M6281 Muscle weakness (generalized): Secondary | ICD-10-CM | POA: Insufficient documentation

## 2021-01-16 DIAGNOSIS — M25661 Stiffness of right knee, not elsewhere classified: Secondary | ICD-10-CM | POA: Insufficient documentation

## 2021-01-16 DIAGNOSIS — R600 Localized edema: Secondary | ICD-10-CM | POA: Diagnosis present

## 2021-01-16 DIAGNOSIS — G8929 Other chronic pain: Secondary | ICD-10-CM | POA: Insufficient documentation

## 2021-01-16 DIAGNOSIS — M25561 Pain in right knee: Secondary | ICD-10-CM | POA: Diagnosis present

## 2021-01-16 NOTE — Therapy (Signed)
[facility] [place], [place], [address] Phone: [phone]   Fax:  [phone]  Physical Therapy Evaluation  Patient Details  Name: Haley Jenkins MRN: [id number] Date of Birth: [birth year] Referring Provider (PT): Esmond Plants MD   Encounter Date: 01/16/2021   PT End of Session - 01/16/21 1239     Visit Number 1    Number of Visits 12    Date for PT Re-Evaluation 02/13/21    Authorization Type FOTO AT LEAST EVERY 5TH VISIT.  PROGRESS NOTE AT 10TH VISIT.  KX MODIFIER AFTER 15 VISITS.    PT Start Time 1030    PT Stop Time 1124    PT Time Calculation (min) 54 min    Activity Tolerance Patient tolerated treatment well    Behavior During Therapy WFL for tasks assessed/performed             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   Diabetes mellitus without complication ([place])    Family history of breast cancer    Family history of colon cancer    Family history of ovarian cancer    Fracture of knee region 10/2014   Bilateral    Gastric polyp    Gastroparesis    GERD (gastroesophageal reflux disease)    Hepatic steatosis 05/09/2019   By ultrasound 04/2019   Hiatal hernia    History of kidney stones    Hypertension    Hypothyroidism    Irritable bowel syndrome    Lactose intolerance    RSD (reflex sympathetic dystrophy)     Past Surgical History:  Procedure Laterality Date   BREAST BIOPSY Left    BREAST EXCISIONAL BIOPSY Right    breast surgery cluster cyst removed     x 2   CARPAL TUNNEL RELEASE Left 2019   cleft palate repaired  birth, 1984   at [place] Right 11/04/2016   [place], 2003   KNEE ARTHROSCOPY Bilateral 11/2014   [facility]   KNEE SURGERY Left 1976   TUBAL LIGATION   1987   URETHRAL SLING  03/2012   mid-urethral sling   Bladder    There were no vitals filed for this visit.    Subjective Assessment - 01/16/21 1235     Subjective COVID-19 screen performed prior to patient entering clinic.  The patient presents to the clinic today s/p right total knee replacement performed on 01/11/21.  She has been complinat to her HEP.  her resting pain-level is a 4/10 today.  She is wearing her TED hose and is walking safely wiht a FWW.  Movement, as expected, increases her pain.    Pertinent History Left knee surgery, right thumb pain, HTN, hypothyroidism, DM, RSD, CTS    How long can you walk comfortably? Around home with FWW.Marland Kitchen    Patient Stated Goals Get back to normal.  Do things without right knee pain.    Currently in Pain? Yes    Pain Score 4     Pain Location Knee    Pain Orientation Right    Pain Descriptors / Indicators Aching;Throbbing;Sore    Pain Type Surgical pain    Pain Onset More than a month ago  Pain Frequency Constant    Aggravating Factors  See above.    Pain Relieving Factors See above.                [facility] PT Assessment - 01/16/21 0001       Assessment   Medical Diagnosis Right total knee replacement    Referring Provider (PT) Esmond Plants MD    Onset Date/Surgical Date 01/11/21      Precautions   Precaution Comments No ultrasound.      Restrictions   Weight Bearing Restrictions No      Balance Screen   Has the patient fallen in the past 6 months No    Has the patient had a decrease in activity level because of a fear of falling?  No    Is the patient reluctant to leave their home because of a fear of falling?  No      Home Environment   Living Environment Private residence      Prior Function   Level of Independence Independent      Observation/Other Assessments   Observations Aquacel intact.  TED hose donned.    Focus on Therapeutic Outcomes (FOTO)  Complete.      Observation/Other Assessments-Edema    Edema  Circumferential      Circumferential Edema   Circumferential - Right RT 2 cms > LT.      ROM / Strength   AROM / PROM / Strength AROM;Strength      AROM   Overall AROM Comments In supine:  -10 degrees with active-assisitve flexion to 70 degrees.      Strength   Overall Strength Comments Patient is currently unable to perform a SLR and SAQ.      Palpation   Palpation comment C/o diffuse right knee pain currently.      Transfers   Comments Minima+ assist required to patient's right LE for sit to supine to sit transfers.      Ambulation/Gait   Gait Comments Step-to gait pattern with a FWW.                        Objective measurements completed on examination: See above findings.       [facility] PT Treatment/Exercise - 01/16/21 0001       Exercises   Exercises Knee/Hip      Knee/Hip Exercises: Aerobic   Nustep level 1 x 10 minutes moving seat forward x 1 to increase flexion.      Modalities   Modalities Vasopneumatic      Vasopneumatic   Number Minutes Vasopneumatic  15 minutes    Vasopnuematic Location  --   Right knee.   Vasopneumatic Pressure Low                          PT Long Term Goals - 01/16/21 1250       PT LONG TERM GOAL #1   Title Independent with a HEP.    Time 4    Period Weeks    Status New      PT LONG TERM GOAL #2   Title Full right active knee extension in order to normalize gait.    Time 4    Period Weeks    Status New      PT LONG TERM GOAL #3   Title Active right knee flexion to 115 degrees+ so the patient can perform functional tasks and do  so with pain not > 2-3/10.    Time 4    Period Weeks    Status New      PT LONG TERM GOAL #4   Title Increase right hip and knee strength to a solid 4+/5 to provide good stability for accomplishment of functional activities.    Time 4    Period Weeks    Status New      PT LONG TERM GOAL #5   Title Perform a reciprocating stair gait with one railing with  pain not > 2-3/10.    Time 4    Period Weeks    Status New                    Plan - 01/16/21 1245     Clinical Impression Statement The patient presents to OPPT s/p right total knee replacment performed on 01/11/21.  She lacks range of motion and antigravity movement at this time.  She is safely using her FWW.  She has a loss of functional mobiliity and requires min+ assist for sit to supine to sit transfers.  She is compliant to her HEP and she appears very motived.  She has an expected amount of edema.  Patient will benefit from skilled physical therapy intervention to address pain and deficits.    Personal Factors and Comorbidities Comorbidity 1;Comorbidity 2;Other    Comorbidities Left knee surgery, right thumb pain, HTN, hypothyroidism, DM, RSD, CTS    Examination-Activity Limitations Other;Transfers;Locomotion Level;Bathing;Stairs    Examination-Participation Restrictions Other    Stability/Clinical Decision Making Stable/Uncomplicated    Rehab Potential Excellent    PT Frequency 3x / week    PT Duration 4 weeks    PT Treatment/Interventions ADLs/Self Care Home Management;Cryotherapy;Electrical Stimulation;Moist Heat;Gait training;Stair training;Functional mobility training;Therapeutic activities;Therapeutic exercise;Neuromuscular re-education;Manual techniques;Patient/family education;Passive range of motion    PT Next Visit Plan Progress into TKA protocol, may try VMS to right quads.  P and AAROM.  Vasopneumatic.    Consulted and Agree with Plan of Care Patient             Patient will benefit from skilled therapeutic intervention in order to improve the following deficits and impairments:  Abnormal gait, Decreased activity tolerance, Decreased range of motion, Decreased strength, Increased edema, Pain  Visit Diagnosis: Chronic pain of right knee - Plan: PT plan of care cert/re-cert  Stiffness of right knee, not elsewhere classified - Plan: PT plan of care  cert/re-cert  Localized edema - Plan: PT plan of care cert/re-cert  Muscle weakness (generalized) - Plan: PT plan of care cert/re-cert     Problem List Patient Active Problem List   Diagnosis Date Noted   Status post total knee replacement, right 01/11/2021   Hepatic steatosis 05/09/2019   Fibroid uterus 03/18/2018   Mixed hyperlipidemia 03/18/2018   Gastric polyp 03/18/2018   Chronic narcotic use 03/18/2018   Essential hypertension 10/06/2017   Acquired hypothyroidism 08/25/2017   Controlled type 2 diabetes mellitus without complication, without long-term current use of insulin ([place]) 12/15/2016   Genetic testing 08/01/2014   Family history of breast cancer    Family history of ovarian cancer    Family history of colon cancer    Nephrolithiasis 05/05/2013   Exogenous obesity 07/22/2012   B12 deficiency 09/12/2009   Reflex sympathetic dystrophy 01/18/2008   GERD 01/18/2008   LACTOSE INTOLERANCE 12/23/2007   Irritable bowel syndrome 12/23/2007    Mearl Harewood, [place], PT 01/16/2021, 12:54 PM  [facility] [address]  [place], [place], [address] Phone: [phone]   Fax:  [phone]  Name: Haley Jenkins MRN: [id number] Date of Birth: 07-30-1950

## 2021-01-16 NOTE — Discharge Summary (Signed)
In most cases prophylactic antibiotics for Dental procdeures after total joint surgery are not necessary.  Exceptions are as follows:  1. History of prior total joint infection  2. Severely immunocompromised (Organ Transplant, cancer chemotherapy, Rheumatoid biologic meds such as [place])  3. Poorly controlled diabetes (A1C &gt; 8.0, blood glucose over 200)  If you have one of these conditions, contact your surgeon for an antibiotic prescription, prior to your dental procedure. Orthopedic Discharge Summary        Physician Discharge Summary  Patient ID: Haley Jenkins MRN: [id number] DOB/AGE: 1951/01/15 70 y.o.  Admit date: 01/11/2021 Discharge date: 01/13/21   Procedures:  Procedure(s) (LRB): TOTAL KNEE ARTHROPLASTY (Right)  Attending Physician:  Dr. Esmond Plants  Admission Diagnoses:   right knee end stage osteoarthritis  Discharge Diagnoses:  right knee end stage osteoarthritsi   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   Diabetes mellitus without complication ([place])    Family history of breast cancer    Family history of colon cancer    Family history of ovarian cancer    Fracture of knee region 10/2014   Bilateral    Gastric polyp    Gastroparesis    GERD (gastroesophageal reflux disease)    Hepatic steatosis 05/09/2019   By ultrasound 04/2019   Hiatal hernia    History of kidney stones    Hypertension    Hypothyroidism    Irritable bowel syndrome    Lactose intolerance    RSD (reflex sympathetic dystrophy)     PCP: Sueanne Margarita, DO   Discharged Condition: good  Hospital Course:  Patient underwent the above stated procedure on 01/11/2021. Patient tolerated the procedure well and brought to the recovery room in good condition and subsequently to the floor. Patient had an uncomplicated hospital course  and was stable for discharge.   Disposition: Discharge disposition: 01-Home or Self Care      with follow up in 2 weeks    Follow-up Information     Netta Cedars, MD. Call in 2 week(s).   Specialty: Orthopedic Surgery Why: call [phone] for appt Contact information: [address] [place] [address] [phone]                 Dental Antibiotics:  In most cases prophylactic antibiotics for Dental procdeures after total joint surgery are not necessary.  Exceptions are as follows:  1. History of prior total joint infection  2. Severely immunocompromised (Organ Transplant, cancer chemotherapy, Rheumatoid biologic meds such as [place])  3. Poorly controlled diabetes (A1C &gt; 8.0, blood glucose over 200)  If you have one of these conditions, contact your surgeon for an antibiotic prescription, prior to your dental procedure.    Allergies as of 01/13/2021       Reactions   Morphine And Related Rash, Other (See Comments)   HallucinationS   Sulfa Antibiotics Nausea And Vomiting, Nausea Only        Medication List     TAKE these medications    acetaminophen 325 MG tablet Commonly known as: TYLENOL Take 1-2 tablets (325-650 mg total) by mouth every 6 (six) hours as needed for mild pain (pain score 1-3 or temp > 100.5).   aspirin 81 MG tablet Take 1 tablet (81 mg total) by mouth  in the morning and at bedtime. What changed: when to take this   atorvastatin 20 MG tablet Commonly known as: LIPITOR Take 1 tablet (20 mg total) by mouth daily.   B-D 3CC LUER-LOK SYR 25GX1" 25G X 1" 3 ML Misc Generic drug: SYRINGE-NEEDLE (DISP) 3 ML USE TO INJECT VITAMIN B12 EVERY 30 DAYS   CALCIUM MAGNESIUM PO Take 1,500 mg by mouth daily.   cyanocobalamin 1000 MCG/ML injection Commonly known as: (VITAMIN B-12) INJECT 1 ML INTRAMUSCULARLY ONCE EVERY MONTH   cyclobenzaprine 5 MG tablet Commonly known as: FLEXERIL Take 1 tablet (5 mg total)  by mouth 3 (three) times daily as needed for muscle spasms.   diclofenac Sodium 1 % Gel Commonly known as: VOLTAREN Apply 2 g topically 4 (four) times daily as needed (pain).   Eszopiclone 3 MG Tabs Take 3 mg by mouth at bedtime. Take immediately before bedtime   Flarex 0.1 % ophthalmic suspension Generic drug: fluorometholone Place 1 drop into both eyes 4 (four) times daily.   fluticasone 50 MCG/ACT nasal spray Commonly known as: FLONASE PLACE TWO SPRAYS INTO THE NOSE DAILY   HYDROmorphone 2 MG tablet Commonly known as: Dilaudid Take 1 tablet (2 mg total) by mouth every 4 (four) hours as needed for severe pain.   levothyroxine 50 MCG tablet Commonly known as: SYNTHROID Take 1 tablet (50 mcg total) by mouth daily.   lisinopril-hydrochlorothiazide 20-25 MG tablet Commonly known as: ZESTORETIC TAKE 1 TABLET DAILY What changed: how much to take   oxyCODONE 15 MG immediate release tablet Commonly known as: ROXICODONE Take 15 mg by mouth 4 (four) times daily as needed for pain.   pantoprazole 40 MG tablet Commonly known as: PROTONIX TAKE 1 TABLET DAILY   promethazine 25 MG suppository Commonly known as: Phenergan Place 1 suppository (25 mg total) rectally every 6 (six) hours as needed for nausea or vomiting.   triamcinolone cream 0.5 % Commonly known as: KENALOG Apply 1 application topically 3 (three) times daily as needed (eczema).   Vitamin D-3 125 MCG (5000 UT) Tabs Take 5,000 Units by mouth daily.          Signed: Ventura Bruns 01/16/2021, 9:23 AM  [facility] is now [facility] [employer] [address] [place], [place], [place] [address] Phone: [place]

## 2021-01-18 ENCOUNTER — Encounter: Payer: Self-pay | Admitting: Physical Therapy

## 2021-01-18 ENCOUNTER — Ambulatory Visit: Payer: Medicare Other | Admitting: Physical Therapy

## 2021-01-18 ENCOUNTER — Other Ambulatory Visit: Payer: Self-pay

## 2021-01-18 DIAGNOSIS — M25661 Stiffness of right knee, not elsewhere classified: Secondary | ICD-10-CM

## 2021-01-18 DIAGNOSIS — G8929 Other chronic pain: Secondary | ICD-10-CM

## 2021-01-18 DIAGNOSIS — R600 Localized edema: Secondary | ICD-10-CM

## 2021-01-18 DIAGNOSIS — M25561 Pain in right knee: Secondary | ICD-10-CM | POA: Diagnosis not present

## 2021-01-18 DIAGNOSIS — M6281 Muscle weakness (generalized): Secondary | ICD-10-CM

## 2021-01-18 NOTE — Therapy (Signed)
[facility] [place], [place], [address] Phone: [phone]   Fax:  [phone]  Physical Therapy Treatment  Patient Details  Name: Haley Jenkins MRN: [id number] Date of Birth: 16-Jan-1951 Referring Provider (PT): Esmond Plants MD   Encounter Date: 01/18/2021   PT End of Session - 01/18/21 1020     Visit Number 2    Number of Visits 12    Date for PT Re-Evaluation 02/13/21    Authorization Type FOTO AT LEAST EVERY 5TH VISIT.  PROGRESS NOTE AT 10TH VISIT.  KX MODIFIER AFTER 15 VISITS.    PT Start Time 0900    PT Stop Time 6759    PT Time Calculation (min) 55 min    Activity Tolerance Patient tolerated treatment well    Behavior During Therapy [facility] for tasks assessed/performed             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   Diabetes mellitus without complication ([place])    Family history of breast cancer    Family history of colon cancer    Family history of ovarian cancer    Fracture of knee region 10/2014   Bilateral    Gastric polyp    Gastroparesis    GERD (gastroesophageal reflux disease)    Hepatic steatosis 05/09/2019   By ultrasound 04/2019   Hiatal hernia    History of kidney stones    Hypertension    Hypothyroidism    Irritable bowel syndrome    Lactose intolerance    RSD (reflex sympathetic dystrophy)     Past Surgical History:  Procedure Laterality Date   BREAST BIOPSY Left    BREAST EXCISIONAL BIOPSY Right    breast surgery cluster cyst removed     x 2   CARPAL TUNNEL RELEASE Left 2019   cleft palate repaired  birth, 1984   at [place] Right 11/04/2016   [place], 2003   KNEE ARTHROSCOPY Bilateral 11/2014   [facility]   KNEE SURGERY Left 1976   TOTAL KNEE  ARTHROPLASTY Right 01/11/2021   Procedure: TOTAL KNEE ARTHROPLASTY;  Surgeon: Netta Cedars, MD;  Location: [facility];  Service: Orthopedics;  Laterality: Right;  with block   TUBAL LIGATION  1987   URETHRAL SLING  03/2012   mid-urethral sling   Bladder    There were no vitals filed for this visit.   Subjective Assessment - 01/18/21 1019     Subjective COVID-19 screen performed prior to patient entering clinic.  No new complaints.    Pertinent History Left knee surgery, right thumb pain, HTN, hypothyroidism, DM, RSD, CTS    How long can you walk comfortably? Around home with FWW.Marland Kitchen    Patient Stated Goals Get back to normal.  Do things without right knee pain.    Currently in Pain? Yes    Pain Score 4     Pain Location Knee    Pain Orientation Right    Pain Descriptors / Indicators Aching;Throbbing;Sore    Pain Type Surgical pain    Pain Onset More than a month ago                [facility] PT Assessment - 01/18/21  0001       AROM   Overall AROM Comments In supine:  Right knee flexion to 90 degrees.                           [facility] PT Treatment/Exercise - 01/18/21 0001       Exercises   Exercises Knee/Hip      Knee/Hip Exercises: Aerobic   Nustep Level 1 x 15 minutes moving seat forward x 2 to increase flexion.      Vasopneumatic   Number Minutes Vasopneumatic  20 minutes    Vasopnuematic Location  --   Right knee.   Vasopneumatic Pressure Low      Manual Therapy   Manual Therapy Passive ROM    Passive ROM In supine:  PROM into right calf stretch, knee extension, hamstrings and flexion x 10 minutes with low load long duration stretching technique utilized.                          PT Long Term Goals - 01/16/21 1250       PT LONG TERM GOAL #1   Title Independent with a HEP.    Time 4    Period Weeks    Status New      PT LONG TERM GOAL #2   Title Full right active knee extension in order to normalize gait.    Time 4     Period Weeks    Status New      PT LONG TERM GOAL #3   Title Active right knee flexion to 115 degrees+ so the patient can perform functional tasks and do so with pain not > 2-3/10.    Time 4    Period Weeks    Status New      PT LONG TERM GOAL #4   Title Increase right hip and knee strength to a solid 4+/5 to provide good stability for accomplishment of functional activities.    Time 4    Period Weeks    Status New      PT LONG TERM GOAL #5   Title Perform a reciprocating stair gait with one railing with pain not > 2-3/10.    Time 4    Period Weeks    Status New                   Plan - 01/18/21 1023     Clinical Impression Statement Patient did very well today and she is highly motivated.  Her supine right knee flexion improved to 90 degrees today.    Personal Factors and Comorbidities Comorbidity 1;Comorbidity 2;Other    Comorbidities Left knee surgery, right thumb pain, HTN, hypothyroidism, DM, RSD, CTS    Examination-Activity Limitations Other;Transfers;Locomotion Level;Bathing;Stairs    Examination-Participation Restrictions Other    Stability/Clinical Decision Making Stable/Uncomplicated    Rehab Potential Excellent    PT Frequency 3x / week    PT Duration 4 weeks    PT Treatment/Interventions ADLs/Self Care Home Management;Cryotherapy;Electrical Stimulation;Moist Heat;Gait training;Stair training;Functional mobility training;Therapeutic activities;Therapeutic exercise;Neuromuscular re-education;Manual techniques;Patient/family education;Passive range of motion    PT Next Visit Plan Progress into TKA protocol, may try VMS to right quads.  P and AAROM.  Vasopneumatic.    Consulted and Agree with Plan of Care Patient             Patient will benefit from skilled therapeutic intervention in order to improve  the following deficits and impairments:  Abnormal gait, Decreased activity tolerance, Decreased range of motion, Decreased strength, Increased edema,  Pain  Visit Diagnosis: Chronic pain of right knee  Stiffness of right knee, not elsewhere classified  Localized edema  Muscle weakness (generalized)     Problem List Patient Active Problem List   Diagnosis Date Noted   Status post total knee replacement, right 01/11/2021   Hepatic steatosis 05/09/2019   Fibroid uterus 03/18/2018   Mixed hyperlipidemia 03/18/2018   Gastric polyp 03/18/2018   Chronic narcotic use 03/18/2018   Essential hypertension 10/06/2017   Acquired hypothyroidism 08/25/2017   Controlled type 2 diabetes mellitus without complication, without long-term current use of insulin ([place]) 12/15/2016   Genetic testing 08/01/2014   Family history of breast cancer    Family history of ovarian cancer    Family history of colon cancer    Nephrolithiasis 05/05/2013   Exogenous obesity 07/22/2012   B12 deficiency 09/12/2009   Reflex sympathetic dystrophy 01/18/2008   GERD 01/18/2008   LACTOSE INTOLERANCE 12/23/2007   Irritable bowel syndrome 12/23/2007    Haley Jenkins, [place], PT 01/18/2021, 10:26 AM  [facility] [address] [place], [place], [address] Phone: [phone]   Fax:  [phone]  Name: Haley Jenkins MRN: [id number] Date of Birth: 1951-05-05

## 2021-01-21 ENCOUNTER — Encounter: Payer: Self-pay | Admitting: Physical Therapy

## 2021-01-21 ENCOUNTER — Ambulatory Visit: Payer: Medicare Other | Admitting: Physical Therapy

## 2021-01-21 ENCOUNTER — Other Ambulatory Visit: Payer: Self-pay

## 2021-01-21 DIAGNOSIS — M6281 Muscle weakness (generalized): Secondary | ICD-10-CM

## 2021-01-21 DIAGNOSIS — G8929 Other chronic pain: Secondary | ICD-10-CM

## 2021-01-21 DIAGNOSIS — M25561 Pain in right knee: Secondary | ICD-10-CM | POA: Diagnosis not present

## 2021-01-21 DIAGNOSIS — R600 Localized edema: Secondary | ICD-10-CM

## 2021-01-21 DIAGNOSIS — M25661 Stiffness of right knee, not elsewhere classified: Secondary | ICD-10-CM

## 2021-01-21 NOTE — Therapy (Signed)
[facility] [place], [place], [address] Phone: [phone]   Fax:  [phone]  Physical Therapy Treatment  Patient Details  Name: Haley Jenkins MRN: [id number] Date of Birth: [birth year] Referring Provider (PT): Esmond Plants MD   Encounter Date: 01/21/2021   PT End of Session - 01/21/21 1032     Visit Number 3    Number of Visits 12    Date for PT Re-Evaluation 02/13/21    Authorization Type FOTO AT LEAST EVERY 5TH VISIT.  PROGRESS NOTE AT 10TH VISIT.  KX MODIFIER AFTER 15 VISITS.    PT Start Time 1031    PT Stop Time 1118    PT Time Calculation (min) 47 min    Equipment Utilized During Treatment Other (comment)   [place]   Activity Tolerance Patient tolerated treatment well    Behavior During Therapy WFL for tasks assessed/performed             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   Diabetes mellitus without complication ([place])    Family history of breast cancer    Family history of colon cancer    Family history of ovarian cancer    Fracture of knee region 10/2014   Bilateral    Gastric polyp    Gastroparesis    GERD (gastroesophageal reflux disease)    Hepatic steatosis 05/09/2019   By ultrasound 04/2019   Hiatal hernia    History of kidney stones    Hypertension    Hypothyroidism    Irritable bowel syndrome    Lactose intolerance    RSD (reflex sympathetic dystrophy)     Past Surgical History:  Procedure Laterality Date   BREAST BIOPSY Left    BREAST EXCISIONAL BIOPSY Right    breast surgery cluster cyst removed     x 2   CARPAL TUNNEL RELEASE Left 2019   cleft palate repaired  birth, 1983   at [place] Right 11/04/2016   [place], 2003   KNEE ARTHROSCOPY Bilateral  11/2014   [facility]   KNEE SURGERY Left 1976   TOTAL KNEE ARTHROPLASTY Right 01/11/2021   Procedure: TOTAL KNEE ARTHROPLASTY;  Surgeon: Netta Cedars, MD;  Location: [facility];  Service: Orthopedics;  Laterality: Right;  with block   TUBAL LIGATION  1987   URETHRAL SLING  03/2012   mid-urethral sling   Bladder    There were no vitals filed for this visit.   Subjective Assessment - 01/21/21 1029     Subjective COVID-19 screen performed prior to patient entering clinic. Reports more tightness and burning in certain spots of her leg today.    Pertinent History Left knee surgery, right thumb pain, HTN, hypothyroidism, DM, RSD, CTS    How long can you walk comfortably? Around home with FWW.Marland Kitchen    Patient Stated Goals Get back to normal.  Do things without right knee pain.    Currently in Pain? Yes    Pain Score 2     Pain Location Knee    Pain Orientation Right    Pain Descriptors / Indicators Burning;Tightness    Pain Type Surgical pain    Pain Onset More than a month ago  Pain Frequency Intermittent                [facility] PT Assessment - 01/21/21 0001       Assessment   Medical Diagnosis Right total knee replacement    Referring Provider (PT) Esmond Plants MD    Onset Date/Surgical Date 01/11/21    Next MD Visit 01/23/2021      Precautions   Precaution Comments No ultrasound.      ROM / Strength   AROM / PROM / Strength AROM      AROM   Overall AROM  Within functional limits for tasks performed    AROM Assessment Site Knee    Right/Left Knee Right    Right Knee Extension 5    Right Knee Flexion 100                           [facility] Adult PT Treatment/Exercise - 01/21/21 0001       Knee/Hip Exercises: Aerobic   Nustep L3, seay 11-9 x15 min      Knee/Hip Exercises: Standing   Hip Flexion AROM;Right;15 reps;Knee bent    Forward Lunges Right;2 sets;10 reps;3 seconds    Forward Lunges Limitations off 6" step    Hip Abduction AROM;Right;15 reps;Knee  straight    Rocker Board 3 minutes      Knee/Hip Exercises: Supine   Short Arc Quad Sets AROM;Right;2 sets;10 reps      Modalities   Modalities Vasopneumatic      Vasopneumatic   Number Minutes Vasopneumatic  15 minutes    Vasopnuematic Location  Knee    Vasopneumatic Pressure Low    Vasopneumatic Temperature  34/pain and edema                          PT Long Term Goals - 01/16/21 1250       PT LONG TERM GOAL #1   Title Independent with a HEP.    Time 4    Period Weeks    Status New      PT LONG TERM GOAL #2   Title Full right active knee extension in order to normalize gait.    Time 4    Period Weeks    Status New      PT LONG TERM GOAL #3   Title Active right knee flexion to 115 degrees+ so the patient can perform functional tasks and do so with pain not > 2-3/10.    Time 4    Period Weeks    Status New      PT LONG TERM GOAL #4   Title Increase right hip and knee strength to a solid 4+/5 to provide good stability for accomplishment of functional activities.    Time 4    Period Weeks    Status New      PT LONG TERM GOAL #5   Title Perform a reciprocating stair gait with one railing with pain not > 2-3/10.    Time 4    Period Weeks    Status New                   Plan - 01/21/21 1114     Clinical Impression Statement Patient presented in clinic with reports of light pain although at different places on RLE. Patient indicated more tightness/cramping of R buttock, medial adductors, medial calf. Patient able to tolerate therex well with no  complaints of increased pain. Patient able to achieve ROM of 5-100 deg. Normal vasopneumatic response noted following removal of the modality.    Personal Factors and Comorbidities Comorbidity 1;Comorbidity 2;Other    Comorbidities Left knee surgery, right thumb pain, HTN, hypothyroidism, DM, RSD, CTS    Examination-Activity Limitations Other;Transfers;Locomotion Level;Bathing;Stairs     Examination-Participation Restrictions Other    Stability/Clinical Decision Making Stable/Uncomplicated    Rehab Potential Excellent    PT Frequency 3x / week    PT Duration 4 weeks    PT Treatment/Interventions ADLs/Self Care Home Management;Cryotherapy;Electrical Stimulation;Moist Heat;Gait training;Stair training;Functional mobility training;Therapeutic activities;Therapeutic exercise;Neuromuscular re-education;Manual techniques;Patient/family education;Passive range of motion    PT Next Visit Plan Gait training for cane in LUE.    Consulted and Agree with Plan of Care Patient             Patient will benefit from skilled therapeutic intervention in order to improve the following deficits and impairments:  Abnormal gait, Decreased activity tolerance, Decreased range of motion, Decreased strength, Increased edema, Pain  Visit Diagnosis: Chronic pain of right knee  Stiffness of right knee, not elsewhere classified  Localized edema  Muscle weakness (generalized)     Problem List Patient Active Problem List   Diagnosis Date Noted   Status post total knee replacement, right 01/11/2021   Hepatic steatosis 05/09/2019   Fibroid uterus 03/18/2018   Mixed hyperlipidemia 03/18/2018   Gastric polyp 03/18/2018   Chronic narcotic use 03/18/2018   Essential hypertension 10/06/2017   Acquired hypothyroidism 08/25/2017   Controlled type 2 diabetes mellitus without complication, without long-term current use of insulin ([place]) 12/15/2016   Genetic testing 08/01/2014   Family history of breast cancer    Family history of ovarian cancer    Family history of colon cancer    Nephrolithiasis 05/05/2013   Exogenous obesity 07/22/2012   B12 deficiency 09/12/2009   Reflex sympathetic dystrophy 01/18/2008   GERD 01/18/2008   LACTOSE INTOLERANCE 12/23/2007   Irritable bowel syndrome 12/23/2007    Standley Brooking, PTA 01/21/2021, 12:13 PM  [facility] [address] [place], [place], [address] Phone: [phone]   Fax:  [phone]  Name: Haley Jenkins MRN: [id number] Date of Birth: Sep 04, 1950

## 2021-01-23 ENCOUNTER — Ambulatory Visit: Payer: Medicare Other | Admitting: Physical Therapy

## 2021-01-23 ENCOUNTER — Other Ambulatory Visit: Payer: Self-pay

## 2021-01-23 ENCOUNTER — Encounter: Payer: Self-pay | Admitting: Physical Therapy

## 2021-01-23 DIAGNOSIS — M6281 Muscle weakness (generalized): Secondary | ICD-10-CM

## 2021-01-23 DIAGNOSIS — R600 Localized edema: Secondary | ICD-10-CM

## 2021-01-23 DIAGNOSIS — G8929 Other chronic pain: Secondary | ICD-10-CM

## 2021-01-23 DIAGNOSIS — M25561 Pain in right knee: Secondary | ICD-10-CM | POA: Diagnosis not present

## 2021-01-23 DIAGNOSIS — M25661 Stiffness of right knee, not elsewhere classified: Secondary | ICD-10-CM

## 2021-01-23 NOTE — Therapy (Signed)
[facility] [place], [place], [address] Phone: [phone]   Fax:  [phone]  Physical Therapy Treatment  Patient Details  Name: Haley Jenkins MRN: [id number] Date of Birth: [birth year] Referring Provider (PT): Esmond Plants MD   Encounter Date: 01/23/2021   PT End of Session - 01/23/21 1338     Visit Number 4    Number of Visits 12    Date for PT Re-Evaluation 02/13/21    Authorization Type FOTO AT LEAST EVERY 5TH VISIT.  PROGRESS NOTE AT 10TH VISIT.  KX MODIFIER AFTER 15 VISITS.    PT Start Time 1302    PT Stop Time 1345    PT Time Calculation (min) 43 min    Equipment Utilized During Treatment Other (comment)   [place]   Activity Tolerance Patient tolerated treatment well    Behavior During Therapy WFL for tasks assessed/performed             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   Diabetes mellitus without complication ([place])    Family history of breast cancer    Family history of colon cancer    Family history of ovarian cancer    Fracture of knee region 10/2014   Bilateral    Gastric polyp    Gastroparesis    GERD (gastroesophageal reflux disease)    Hepatic steatosis 05/09/2019   By ultrasound 04/2019   Hiatal hernia    History of kidney stones    Hypertension    Hypothyroidism    Irritable bowel syndrome    Lactose intolerance    RSD (reflex sympathetic dystrophy)     Past Surgical History:  Procedure Laterality Date   BREAST BIOPSY Left    BREAST EXCISIONAL BIOPSY Right    breast surgery cluster cyst removed     x 2   CARPAL TUNNEL RELEASE Left 2019   cleft palate repaired  birth, 1983   at [place] Right 11/04/2016   [place], 2003   KNEE ARTHROSCOPY Bilateral  11/2014   [facility]   KNEE SURGERY Left 1976   TOTAL KNEE ARTHROPLASTY Right 01/11/2021   Procedure: TOTAL KNEE ARTHROPLASTY;  Surgeon: Netta Cedars, MD;  Location: [facility];  Service: Orthopedics;  Laterality: Right;  with block   TUBAL LIGATION  1987   URETHRAL SLING  03/2012   mid-urethral sling   Bladder    There were no vitals filed for this visit.   Subjective Assessment - 01/23/21 1303     Subjective COVID-19 screen performed prior to patient entering clinic. Reports more soreness especially in R calf. Went up the full flight of stairs at home.    Pertinent History Left knee surgery, right thumb pain, HTN, hypothyroidism, DM, RSD, CTS    How long can you walk comfortably? Around home with FWW.Marland Kitchen    Patient Stated Goals Get back to normal.  Do things without right knee pain.    Currently in Pain? Yes    Pain Score 3     Pain Location Knee    Pain Orientation Right    Pain Descriptors / Indicators Sore    Pain Type Surgical pain    Pain Onset More than  a month ago    Pain Frequency Intermittent                [facility] PT Assessment - 01/23/21 0001       Assessment   Medical Diagnosis Right total knee replacement    Referring Provider (PT) Esmond Plants MD    Onset Date/Surgical Date 01/11/21    Next MD Visit 01/29/2021      Precautions   Precaution Comments No ultrasound.      Restrictions   Weight Bearing Restrictions No      ROM / Strength   AROM / PROM / Strength AROM      AROM   Overall AROM  Within functional limits for tasks performed    AROM Assessment Site Knee    Right/Left Knee Right    Right Knee Extension 4    Right Knee Flexion 113                           [facility] Adult PT Treatment/Exercise - 01/23/21 0001       Knee/Hip Exercises: Aerobic   Nustep L4, seat 9-7 x15 min      Knee/Hip Exercises: Standing   Forward Lunges Right;2 sets;10 reps;3 seconds    Forward Lunges Limitations off 6" step    Forward Step Up Right;2  sets;10 reps;Hand Hold: 2;Step Height: 6"    Step Down Right;2 sets;10 reps;Hand Hold: 2;Step Height: 4"    Rocker Board 3 minutes      Knee/Hip Exercises: Supine   Short Arc Quad Sets AROM;Right;2 sets;10 reps      Modalities   Modalities Vasopneumatic      Vasopneumatic   Number Minutes Vasopneumatic  10 minutes    Vasopnuematic Location  Knee    Vasopneumatic Pressure Medium    Vasopneumatic Temperature  34/pain and edema                          PT Long Term Goals - 01/23/21 1412       PT LONG TERM GOAL #1   Title Independent with a HEP.    Time 4    Period Weeks    Status On-going      PT LONG TERM GOAL #2   Title Full right active knee extension in order to normalize gait.    Time 4    Period Weeks    Status On-going      PT LONG TERM GOAL #3   Title Active right knee flexion to 115 degrees+ so the patient can perform functional tasks and do so with pain not > 2-3/10.    Time 4    Period Weeks    Status On-going      PT LONG TERM GOAL #4   Title Increase right hip and knee strength to a solid 4+/5 to provide good stability for accomplishment of functional activities.    Time 4    Period Weeks    Status On-going      PT LONG TERM GOAL #5   Title Perform a reciprocating stair gait with one railing with pain not > 2-3/10.    Time 4    Period Weeks    Status On-going                   Plan - 01/23/21 1339     Clinical Impression Statement Patient presented in clinic with reports of  increased soreness especially of R calf. Patient trying to return to more ADLs such as stairs but is not going downstairs to washing machine yet. Patient able to progress to other functional strengthening such as forward, step downs. AROM of R knee measured as 4-113 deg. Normal vasopneumatic response noted following removal of the modality.    Personal Factors and Comorbidities Comorbidity 1;Comorbidity 2;Other    Comorbidities Left knee surgery, right  thumb pain, HTN, hypothyroidism, DM, RSD, CTS    Examination-Activity Limitations Other;Transfers;Locomotion Level;Bathing;Stairs    Examination-Participation Restrictions Other    Stability/Clinical Decision Making Stable/Uncomplicated    Rehab Potential Excellent    PT Frequency 3x / week    PT Duration 4 weeks    PT Treatment/Interventions ADLs/Self Care Home Management;Cryotherapy;Electrical Stimulation;Moist Heat;Gait training;Stair training;Functional mobility training;Therapeutic activities;Therapeutic exercise;Neuromuscular re-education;Manual techniques;Patient/family education;Passive range of motion    PT Next Visit Plan Gait training for cane in LUE.    Consulted and Agree with Plan of Care Patient             Patient will benefit from skilled therapeutic intervention in order to improve the following deficits and impairments:  Abnormal gait, Decreased activity tolerance, Decreased range of motion, Decreased strength, Increased edema, Pain  Visit Diagnosis: Chronic pain of right knee  Stiffness of right knee, not elsewhere classified  Localized edema  Muscle weakness (generalized)     Problem List Patient Active Problem List   Diagnosis Date Noted   Status post total knee replacement, right 01/11/2021   Hepatic steatosis 05/09/2019   Fibroid uterus 03/18/2018   Mixed hyperlipidemia 03/18/2018   Gastric polyp 03/18/2018   Chronic narcotic use 03/18/2018   Essential hypertension 10/06/2017   Acquired hypothyroidism 08/25/2017   Controlled type 2 diabetes mellitus without complication, without long-term current use of insulin ([place]) 12/15/2016   Genetic testing 08/01/2014   Family history of breast cancer    Family history of ovarian cancer    Family history of colon cancer    Nephrolithiasis 05/05/2013   Exogenous obesity 07/22/2012   B12 deficiency 09/12/2009   Reflex sympathetic dystrophy 01/18/2008   GERD 01/18/2008   LACTOSE INTOLERANCE 12/23/2007    Irritable bowel syndrome 12/23/2007    Standley Brooking, PTA 01/23/2021, 2:15 PM  [facility] [address] [place], [place], [address] Phone: [phone]   Fax:  [phone]  Name: Haley Jenkins MRN: [id number] Date of Birth: Aug 15, 1950

## 2021-01-25 ENCOUNTER — Ambulatory Visit: Payer: Medicare Other | Admitting: Physical Therapy

## 2021-01-25 ENCOUNTER — Other Ambulatory Visit: Payer: Self-pay

## 2021-01-25 DIAGNOSIS — M25561 Pain in right knee: Secondary | ICD-10-CM

## 2021-01-25 DIAGNOSIS — M25661 Stiffness of right knee, not elsewhere classified: Secondary | ICD-10-CM

## 2021-01-25 DIAGNOSIS — G8929 Other chronic pain: Secondary | ICD-10-CM

## 2021-01-25 DIAGNOSIS — R600 Localized edema: Secondary | ICD-10-CM

## 2021-01-25 DIAGNOSIS — M6281 Muscle weakness (generalized): Secondary | ICD-10-CM

## 2021-01-25 NOTE — Therapy (Signed)
[facility] [place], [place], [address] Phone: [phone]   Fax:  [phone]  Physical Therapy Treatment  Patient Details  Name: Haley Jenkins MRN: [id number] Date of Birth: 12-24-1950 Referring Provider (PT): Esmond Plants MD   Encounter Date: 01/25/2021   PT End of Session - 01/25/21 1134     Visit Number 5    Number of Visits 12    Date for PT Re-Evaluation 02/13/21    Authorization Type FOTO AT LEAST EVERY 5TH VISIT.  PROGRESS NOTE AT 10TH VISIT.  KX MODIFIER AFTER 15 VISITS.    PT Start Time 1033    PT Stop Time 1115    PT Time Calculation (min) 42 min    Equipment Utilized During Treatment Other (comment)   [place]   Activity Tolerance Patient tolerated treatment well    Behavior During Therapy WFL for tasks assessed/performed             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   Diabetes mellitus without complication ([place])    Family history of breast cancer    Family history of colon cancer    Family history of ovarian cancer    Fracture of knee region 10/2014   Bilateral    Gastric polyp    Gastroparesis    GERD (gastroesophageal reflux disease)    Hepatic steatosis 05/09/2019   By ultrasound 04/2019   Hiatal hernia    History of kidney stones    Hypertension    Hypothyroidism    Irritable bowel syndrome    Lactose intolerance    RSD (reflex sympathetic dystrophy)     Past Surgical History:  Procedure Laterality Date   BREAST BIOPSY Left    BREAST EXCISIONAL BIOPSY Right    breast surgery cluster cyst removed     x 2   CARPAL TUNNEL RELEASE Left 2019   cleft palate repaired  birth, 1983   at [place] Right 11/04/2016   [place], 2003   KNEE ARTHROSCOPY Bilateral  11/2014   [facility]   KNEE SURGERY Left 1976   TOTAL KNEE ARTHROPLASTY Right 01/11/2021   Procedure: TOTAL KNEE ARTHROPLASTY;  Surgeon: Netta Cedars, MD;  Location: [facility];  Service: Orthopedics;  Laterality: Right;  with block   TUBAL LIGATION  1987   URETHRAL SLING  03/2012   mid-urethral sling   Bladder    There were no vitals filed for this visit.   Subjective Assessment - 01/25/21 1150     Subjective COVID-19 screen performed prior to patient entering clinic. No new complaints.    Pertinent History Left knee surgery, right thumb pain, HTN, hypothyroidism, DM, RSD, CTS    How long can you walk comfortably? Around home with FWW.Marland Kitchen    Patient Stated Goals Get back to normal.  Do things without right knee pain.    Currently in Pain? Other (Comment)   No pain assessment provided               [facility] PT Assessment - 01/25/21 0001       Assessment   Medical Diagnosis Right total knee replacement    Referring Provider (PT) Esmond Plants MD  Onset Date/Surgical Date 01/11/21    Next MD Visit 01/29/2021      Precautions   Precaution Comments No ultrasound.      ROM / Strength   AROM / PROM / Strength AROM      AROM   Overall AROM  Within functional limits for tasks performed    AROM Assessment Site Knee    Right/Left Knee Right    Right Knee Extension 4    Right Knee Flexion 118                           [facility] Adult PT Treatment/Exercise - 01/25/21 0001       Knee/Hip Exercises: Aerobic   Nustep L4, seat 10-7 x15 min      Knee/Hip Exercises: Machines for Strengthening   Cybex Knee Extension 10# x5 reps but stopped due to pain      Knee/Hip Exercises: Standing   Forward Lunges Right;2 sets;10 reps;3 seconds    Forward Lunges Limitations off 6" step    Hip Abduction AROM;Right;Knee straight;20 reps    Functional Squat 15 reps      Knee/Hip Exercises: Supine   Straight Leg Raises AROM;Right;15 reps      Modalities   Modalities Vasopneumatic       Vasopneumatic   Number Minutes Vasopneumatic  10 minutes    Vasopnuematic Location  Knee    Vasopneumatic Pressure Medium    Vasopneumatic Temperature  34/pain and edema                          PT Long Term Goals - 01/23/21 1412       PT LONG TERM GOAL #1   Title Independent with a HEP.    Time 4    Period Weeks    Status On-going      PT LONG TERM GOAL #2   Title Full right active knee extension in order to normalize gait.    Time 4    Period Weeks    Status On-going      PT LONG TERM GOAL #3   Title Active right knee flexion to 115 degrees+ so the patient can perform functional tasks and do so with pain not > 2-3/10.    Time 4    Period Weeks    Status On-going      PT LONG TERM GOAL #4   Title Increase right hip and knee strength to a solid 4+/5 to provide good stability for accomplishment of functional activities.    Time 4    Period Weeks    Status On-going      PT LONG TERM GOAL #5   Title Perform a reciprocating stair gait with one railing with pain not > 2-3/10.    Time 4    Period Weeks    Status On-going                   Plan - 01/25/21 1144     Clinical Impression Statement Patient presented in clinic with no complaints. Patient able to tolerate progression good but limited reps with knee extension machine due to discomfort. Patient's R quad strength improving with minimal extensor lag. AROM of R knee measured as 4-118 deg. Normal vasopneumatic response noted following removal of the modality.    Personal Factors and Comorbidities Comorbidity 1;Comorbidity 2;Other    Comorbidities Left knee surgery, right thumb pain, HTN, hypothyroidism, DM,  RSD, CTS    Examination-Activity Limitations Other;Transfers;Locomotion Level;Bathing;Stairs    Examination-Participation Restrictions Other    Stability/Clinical Decision Making Stable/Uncomplicated    Rehab Potential Excellent    PT Frequency 3x / week    PT Duration 4 weeks    PT  Treatment/Interventions ADLs/Self Care Home Management;Cryotherapy;Electrical Stimulation;Moist Heat;Gait training;Stair training;Functional mobility training;Therapeutic activities;Therapeutic exercise;Neuromuscular re-education;Manual techniques;Patient/family education;Passive range of motion    PT Next Visit Plan Gait training for cane in LUE.    Consulted and Agree with Plan of Care Patient             Patient will benefit from skilled therapeutic intervention in order to improve the following deficits and impairments:  Abnormal gait, Decreased activity tolerance, Decreased range of motion, Decreased strength, Increased edema, Pain  Visit Diagnosis: Chronic pain of right knee  Stiffness of right knee, not elsewhere classified  Localized edema  Muscle weakness (generalized)     Problem List Patient Active Problem List   Diagnosis Date Noted   Status post total knee replacement, right 01/11/2021   Hepatic steatosis 05/09/2019   Fibroid uterus 03/18/2018   Mixed hyperlipidemia 03/18/2018   Gastric polyp 03/18/2018   Chronic narcotic use 03/18/2018   Essential hypertension 10/06/2017   Acquired hypothyroidism 08/25/2017   Controlled type 2 diabetes mellitus without complication, without long-term current use of insulin ([place]) 12/15/2016   Genetic testing 08/01/2014   Family history of breast cancer    Family history of ovarian cancer    Family history of colon cancer    Nephrolithiasis 05/05/2013   Exogenous obesity 07/22/2012   B12 deficiency 09/12/2009   Reflex sympathetic dystrophy 01/18/2008   GERD 01/18/2008   LACTOSE INTOLERANCE 12/23/2007   Irritable bowel syndrome 12/23/2007    Standley Brooking, PTA 01/25/2021, 11:50 AM  [facility] [address] [place], [place], [address] Phone: [phone]   Fax:  [phone]  Name: PREET MANGANO MRN: [id number] Date of Birth: 1950/11/29

## 2021-01-28 ENCOUNTER — Other Ambulatory Visit: Payer: Self-pay

## 2021-01-28 ENCOUNTER — Ambulatory Visit: Payer: Medicare Other | Admitting: Physical Therapy

## 2021-01-28 ENCOUNTER — Encounter: Payer: Self-pay | Admitting: Physical Therapy

## 2021-01-28 DIAGNOSIS — M6281 Muscle weakness (generalized): Secondary | ICD-10-CM

## 2021-01-28 DIAGNOSIS — M25661 Stiffness of right knee, not elsewhere classified: Secondary | ICD-10-CM

## 2021-01-28 DIAGNOSIS — G8929 Other chronic pain: Secondary | ICD-10-CM

## 2021-01-28 DIAGNOSIS — R600 Localized edema: Secondary | ICD-10-CM

## 2021-01-28 DIAGNOSIS — M25561 Pain in right knee: Secondary | ICD-10-CM | POA: Diagnosis not present

## 2021-01-28 NOTE — Therapy (Signed)
[facility] [place], [place], [address] Phone: [phone]   Fax:  [phone]  Physical Therapy Treatment  Patient Details  Name: Haley Jenkins MRN: [id number] Date of Birth: [birth year] Referring Provider (PT): Esmond Plants MD   Encounter Date: 01/28/2021   PT End of Session - 01/28/21 1434     Visit Number 6    Number of Visits 12    Date for PT Re-Evaluation 02/13/21    Authorization Type FOTO AT LEAST EVERY 5TH VISIT.  PROGRESS NOTE AT 10TH VISIT.  KX MODIFIER AFTER 15 VISITS.    PT Start Time 1341    PT Stop Time 1432    PT Time Calculation (min) 51 min    Equipment Utilized During Treatment Other (comment)   [place]   Activity Tolerance Patient tolerated treatment well    Behavior During Therapy [facility] for tasks assessed/performed             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   Diabetes mellitus without complication ([place])    Family history of breast cancer    Family history of colon cancer    Family history of ovarian cancer    Fracture of knee region 10/2014   Bilateral    Gastric polyp    Gastroparesis    GERD (gastroesophageal reflux disease)    Hepatic steatosis 05/09/2019   By ultrasound 04/2019   Hiatal hernia    History of kidney stones    Hypertension    Hypothyroidism    Irritable bowel syndrome    Lactose intolerance    RSD (reflex sympathetic dystrophy)     Past Surgical History:  Procedure Laterality Date   BREAST BIOPSY Left    BREAST EXCISIONAL BIOPSY Right    breast surgery cluster cyst removed     x 2   CARPAL TUNNEL RELEASE Left 2019   cleft palate repaired  birth, 1983   at [place] Right 11/04/2016   [place], 2003   KNEE ARTHROSCOPY Bilateral  11/2014   [facility]   KNEE SURGERY Left 1976   TOTAL KNEE ARTHROPLASTY Right 01/11/2021   Procedure: TOTAL KNEE ARTHROPLASTY;  Surgeon: Netta Cedars, MD;  Location: [facility];  Service: Orthopedics;  Laterality: Right;  with block   TUBAL LIGATION  1987   URETHRAL SLING  03/2012   mid-urethral sling   Bladder    There were no vitals filed for this visit.   Subjective Assessment - 01/28/21 1350     Subjective COVID-19 screen performed prior to patient entering clinic. Has been climbing stairs easier and is now sleeping upstairs. Took off her post surgical bandage as it was feeling super tight on her skin.    Pertinent History Left knee surgery, right thumb pain, HTN, hypothyroidism, DM, RSD, CTS    How long can you walk comfortably? Around home with FWW.Marland Kitchen    Patient Stated Goals Get back to normal.  Do things without right knee pain.    Currently in Pain? No/denies                [facility] PT Assessment - 01/28/21 0001       Assessment   Medical Diagnosis  Right total knee replacement    Referring Provider (PT) Esmond Plants MD    Onset Date/Surgical Date 01/11/21    Next MD Visit 01/29/2021      Precautions   Precaution Comments No ultrasound.      ROM / Strength   AROM / PROM / Strength AROM      AROM   Overall AROM  Within functional limits for tasks performed    AROM Assessment Site Knee    Right/Left Knee Right    Right Knee Flexion 125                           [facility] Adult PT Treatment/Exercise - 01/28/21 0001       Knee/Hip Exercises: Aerobic   Recumbent Bike L1, seat 5 x5 min    Nustep L4, seat 9-8 x10 min      Knee/Hip Exercises: Standing   Forward Lunges Right;2 sets;10 reps;3 seconds    Forward Lunges Limitations off 8" step    Lateral Step Up Right;2 sets;10 reps;Hand Hold: 2;Step Height: 6"    Forward Step Up Right;2 sets;10 reps;Hand Hold: 2;Step Height: 6"    Step Down Right;2 sets;10 reps;Hand Hold: 2;Step Height: 4"    Rocker Board  3 minutes      Knee/Hip Exercises: Supine   Straight Leg Raises AROM;Right;20 reps      Modalities   Modalities Vasopneumatic      Vasopneumatic   Number Minutes Vasopneumatic  10 minutes    Vasopnuematic Location  Knee    Vasopneumatic Pressure Medium    Vasopneumatic Temperature  34/edema                          PT Long Term Goals - 01/23/21 1412       PT LONG TERM GOAL #1   Title Independent with a HEP.    Time 4    Period Weeks    Status On-going      PT LONG TERM GOAL #2   Title Full right active knee extension in order to normalize gait.    Time 4    Period Weeks    Status On-going      PT LONG TERM GOAL #3   Title Active right knee flexion to 115 degrees+ so the patient can perform functional tasks and do so with pain not > 2-3/10.    Time 4    Period Weeks    Status On-going      PT LONG TERM GOAL #4   Title Increase right hip and knee strength to a solid 4+/5 to provide good stability for accomplishment of functional activities.    Time 4    Period Weeks    Status On-going      PT LONG TERM GOAL #5   Title Perform a reciprocating stair gait with one railing with pain not > 2-3/10.    Time 4    Period Weeks    Status On-going                   Plan - 01/28/21 1436     Clinical Impression Statement Patient continues to excel following R TKR. Patient did remove the aquacell over the weekend as she states it began feeling extremely tight. Patient has not removed any steristrips. Patient is now going up/down stairs within her home so that she can sleep in her bedroom. Patient is  going down laterally per report. Patient's quad activation is good and able to complete SLR with minimal extensor lag. AROM R knee flexion measure as 125 deg. Normal vasopneumatic response noted following removal of the modality.    Personal Factors and Comorbidities Comorbidity 1;Comorbidity 2;Other    Comorbidities Left knee surgery, right thumb pain, HTN,  hypothyroidism, DM, RSD, CTS    Examination-Activity Limitations Other;Transfers;Locomotion Level;Bathing;Stairs    Examination-Participation Restrictions Other    Stability/Clinical Decision Making Stable/Uncomplicated    Rehab Potential Excellent    PT Frequency 3x / week    PT Duration 4 weeks    PT Treatment/Interventions ADLs/Self Care Home Management;Cryotherapy;Electrical Stimulation;Moist Heat;Gait training;Stair training;Functional mobility training;Therapeutic activities;Therapeutic exercise;Neuromuscular re-education;Manual techniques;Patient/family education;Passive range of motion    PT Next Visit Plan Gait training for cane in LUE.    Consulted and Agree with Plan of Care Patient             Patient will benefit from skilled therapeutic intervention in order to improve the following deficits and impairments:  Abnormal gait, Decreased activity tolerance, Decreased range of motion, Decreased strength, Increased edema, Pain  Visit Diagnosis: Chronic pain of right knee  Stiffness of right knee, not elsewhere classified  Localized edema  Muscle weakness (generalized)     Problem List Patient Active Problem List   Diagnosis Date Noted   Status post total knee replacement, right 01/11/2021   Hepatic steatosis 05/09/2019   Fibroid uterus 03/18/2018   Mixed hyperlipidemia 03/18/2018   Gastric polyp 03/18/2018   Chronic narcotic use 03/18/2018   Essential hypertension 10/06/2017   Acquired hypothyroidism 08/25/2017   Controlled type 2 diabetes mellitus without complication, without long-term current use of insulin ([place]) 12/15/2016   Genetic testing 08/01/2014   Family history of breast cancer    Family history of ovarian cancer    Family history of colon cancer    Nephrolithiasis 05/05/2013   Exogenous obesity 07/22/2012   B12 deficiency 09/12/2009   Reflex sympathetic dystrophy 01/18/2008   GERD 01/18/2008   LACTOSE INTOLERANCE 12/23/2007   Irritable bowel  syndrome 12/23/2007    Standley Brooking, PTA 01/28/2021, 2:39 PM  [facility] [address] [place], [place], [address] Phone: [phone]   Fax:  [phone]  Name: Haley Jenkins MRN: [id number] Date of Birth: 02/25/1951

## 2021-01-30 ENCOUNTER — Other Ambulatory Visit: Payer: Self-pay

## 2021-01-30 ENCOUNTER — Encounter: Payer: Self-pay | Admitting: Physical Therapy

## 2021-01-30 ENCOUNTER — Ambulatory Visit: Payer: Medicare Other | Admitting: Physical Therapy

## 2021-01-30 DIAGNOSIS — M25561 Pain in right knee: Secondary | ICD-10-CM | POA: Diagnosis not present

## 2021-01-30 DIAGNOSIS — R600 Localized edema: Secondary | ICD-10-CM

## 2021-01-30 DIAGNOSIS — M25661 Stiffness of right knee, not elsewhere classified: Secondary | ICD-10-CM

## 2021-01-30 DIAGNOSIS — M6281 Muscle weakness (generalized): Secondary | ICD-10-CM

## 2021-01-30 DIAGNOSIS — G8929 Other chronic pain: Secondary | ICD-10-CM

## 2021-01-30 NOTE — Therapy (Signed)
[facility] [place], [place], [address] Phone: [phone]   Fax:  [phone]  Physical Therapy Treatment  Patient Details  Name: Haley Jenkins MRN: [id number] Date of Birth: [birth year] Referring Provider (PT): Esmond Plants MD   Encounter Date: 01/30/2021   PT End of Session - 01/30/21 1304     Visit Number 7    Number of Visits 12    Date for PT Re-Evaluation 02/13/21    Authorization Type FOTO AT LEAST EVERY 5TH VISIT.  PROGRESS NOTE AT 10TH VISIT.  KX MODIFIER AFTER 15 VISITS.    PT Start Time 1302    PT Stop Time 1345    PT Time Calculation (min) 43 min    Equipment Utilized During Treatment Other (comment)   [place]   Activity Tolerance Patient tolerated treatment well    Behavior During Therapy WFL for tasks assessed/performed             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   Diabetes mellitus without complication ([place])    Family history of breast cancer    Family history of colon cancer    Family history of ovarian cancer    Fracture of knee region 10/2014   Bilateral    Gastric polyp    Gastroparesis    GERD (gastroesophageal reflux disease)    Hepatic steatosis 05/09/2019   By ultrasound 04/2019   Hiatal hernia    History of kidney stones    Hypertension    Hypothyroidism    Irritable bowel syndrome    Lactose intolerance    RSD (reflex sympathetic dystrophy)     Past Surgical History:  Procedure Laterality Date   BREAST BIOPSY Left    BREAST EXCISIONAL BIOPSY Right    breast surgery cluster cyst removed     x 2   CARPAL TUNNEL RELEASE Left 2019   cleft palate repaired  birth, 1983   at [place] Right 11/04/2016   [place], 2003   KNEE ARTHROSCOPY Bilateral  11/2014   [facility]   KNEE SURGERY Left 1976   TOTAL KNEE ARTHROPLASTY Right 01/11/2021   Procedure: TOTAL KNEE ARTHROPLASTY;  Surgeon: Netta Cedars, MD;  Location: [facility];  Service: Orthopedics;  Laterality: Right;  with block   TUBAL LIGATION  1987   URETHRAL SLING  03/2012   mid-urethral sling   Bladder    There were no vitals filed for this visit.   Subjective Assessment - 01/30/21 1302     Subjective COVID-19 screen performed prior to patient entering clinic. MD was very pleased with her progress and states that she could go to one time a week or two times a week.    Pertinent History Left knee surgery, right thumb pain, HTN, hypothyroidism, DM, RSD, CTS    How long can you walk comfortably? Around home with FWW.Marland Kitchen    Patient Stated Goals Get back to normal.  Do things without right knee pain.    Currently in Pain? No/denies                [facility] PT Assessment - 01/30/21 0001       Assessment   Medical Diagnosis Right total  knee replacement    Referring Provider (PT) Esmond Plants MD    Onset Date/Surgical Date 01/11/21    Next MD Visit 02/27/2021      Precautions   Precaution Comments No ultrasound.                           [facility] Adult PT Treatment/Exercise - 01/30/21 0001       Knee/Hip Exercises: Aerobic   Recumbent Bike L2, seat 4 x10 min      Knee/Hip Exercises: Machines for Strengthening   Cybex Knee Extension 10# 2x10 reps    Cybex Knee Flexion 30# 2x10 reps    Cybex Leg Press 2 pl, seat 7 x30 reps      Knee/Hip Exercises: Standing   Lateral Step Up Right;2 sets;10 reps;Hand Hold: 2;Step Height: 4"    Step Down Right;2 sets;10 reps;Hand Hold: 2;Step Height: 4"    Walking with Sports Cord Backwards, R side stepping blue XTS x5 reps each      Knee/Hip Exercises: Seated   Sit to Sand 15 reps;without UE support   2" step under LLE to reinforce RLE WB                Balance Exercises - 01/30/21 0001       Balance Exercises:  Standing   Standing Eyes Opened Narrow base of support (BOS);Foam/compliant surface;Time    Standing Eyes Opened Time x2 min    SLS Eyes open;Solid surface;Intermittent upper extremity support;5 reps   max holds with L toe touch                    PT Long Term Goals - 01/23/21 1412       PT LONG TERM GOAL #1   Title Independent with a HEP.    Time 4    Period Weeks    Status On-going      PT LONG TERM GOAL #2   Title Full right active knee extension in order to normalize gait.    Time 4    Period Weeks    Status On-going      PT LONG TERM GOAL #3   Title Active right knee flexion to 115 degrees+ so the patient can perform functional tasks and do so with pain not > 2-3/10.    Time 4    Period Weeks    Status On-going      PT LONG TERM GOAL #4   Title Increase right hip and knee strength to a solid 4+/5 to provide good stability for accomplishment of functional activities.    Time 4    Period Weeks    Status On-going      PT LONG TERM GOAL #5   Title Perform a reciprocating stair gait with one railing with pain not > 2-3/10.    Time 4    Period Weeks    Status On-going                   Plan - 01/30/21 1352     Clinical Impression Statement Patient able to tolerate therex very well today as strengthening was progressed. Patient's ortho surgeon was incredibly pleased with her ROM and overall progression. More eccentric training completed with stairs as well today although she is making her ascend and descend reciprically at home. Initial balance activities for SLS also started today with mild deficits noted with RLE SLS and L toe touch. Patient instructed  to do RLE SLS with L toe touch while at home when washing dishes or her hands, etc.    Personal Factors and Comorbidities Comorbidity 1;Comorbidity 2;Other    Comorbidities Left knee surgery, right thumb pain, HTN, hypothyroidism, DM, RSD, CTS    Examination-Activity Limitations  Other;Transfers;Locomotion Level;Bathing;Stairs    Examination-Participation Restrictions Other    Stability/Clinical Decision Making Stable/Uncomplicated    Rehab Potential Excellent    PT Frequency 3x / week    PT Duration 4 weeks    PT Treatment/Interventions ADLs/Self Care Home Management;Cryotherapy;Electrical Stimulation;Moist Heat;Gait training;Stair training;Functional mobility training;Therapeutic activities;Therapeutic exercise;Neuromuscular re-education;Manual techniques;Patient/family education;Passive range of motion    PT Next Visit Plan Continue progressive strengthening and balance activities.    Consulted and Agree with Plan of Care Patient             Patient will benefit from skilled therapeutic intervention in order to improve the following deficits and impairments:  Abnormal gait, Decreased activity tolerance, Decreased range of motion, Decreased strength, Increased edema, Pain  Visit Diagnosis: Chronic pain of right knee  Stiffness of right knee, not elsewhere classified  Localized edema  Muscle weakness (generalized)     Problem List Patient Active Problem List   Diagnosis Date Noted   Status post total knee replacement, right 01/11/2021   Hepatic steatosis 05/09/2019   Fibroid uterus 03/18/2018   Mixed hyperlipidemia 03/18/2018   Gastric polyp 03/18/2018   Chronic narcotic use 03/18/2018   Essential hypertension 10/06/2017   Acquired hypothyroidism 08/25/2017   Controlled type 2 diabetes mellitus without complication, without long-term current use of insulin ([place]) 12/15/2016   Genetic testing 08/01/2014   Family history of breast cancer    Family history of ovarian cancer    Family history of colon cancer    Nephrolithiasis 05/05/2013   Exogenous obesity 07/22/2012   B12 deficiency 09/12/2009   Reflex sympathetic dystrophy 01/18/2008   GERD 01/18/2008   LACTOSE INTOLERANCE 12/23/2007   Irritable bowel syndrome 12/23/2007    Standley Brooking, PTA 01/30/2021, 2:06 PM  [facility] [address] [place], [place], [address] Phone: [phone]   Fax:  [phone]  Name: Haley Jenkins MRN: [id number] Date of Birth: 05/02/1951

## 2021-02-01 ENCOUNTER — Encounter: Payer: Medicare Other | Admitting: Physical Therapy

## 2021-02-05 HISTORY — PX: CATARACT EXTRACTION W/ INTRAOCULAR LENS IMPLANT: SHX1309

## 2021-02-06 ENCOUNTER — Ambulatory Visit: Payer: Medicare Other | Admitting: Physical Therapy

## 2021-02-08 ENCOUNTER — Other Ambulatory Visit: Payer: Self-pay

## 2021-02-08 ENCOUNTER — Ambulatory Visit: Payer: Medicare Other | Attending: Physician Assistant

## 2021-02-08 DIAGNOSIS — R600 Localized edema: Secondary | ICD-10-CM | POA: Diagnosis present

## 2021-02-08 DIAGNOSIS — M6281 Muscle weakness (generalized): Secondary | ICD-10-CM | POA: Insufficient documentation

## 2021-02-08 DIAGNOSIS — M25561 Pain in right knee: Secondary | ICD-10-CM | POA: Insufficient documentation

## 2021-02-08 DIAGNOSIS — G8929 Other chronic pain: Secondary | ICD-10-CM | POA: Diagnosis present

## 2021-02-08 DIAGNOSIS — M25661 Stiffness of right knee, not elsewhere classified: Secondary | ICD-10-CM | POA: Diagnosis present

## 2021-02-08 NOTE — Therapy (Signed)
[facility] [place], [place], [address] Phone: [phone]   Fax:  [phone]  Physical Therapy Treatment  Patient Details  Name: Haley Jenkins MRN: [id number] Date of Birth: 04/12/1951 Referring Provider (PT): Esmond Plants MD   Encounter Date: 02/08/2021   PT End of Session - 02/08/21 1039     Visit Number 8    Number of Visits 12    Date for PT Re-Evaluation 02/13/21    Authorization Type FOTO AT LEAST EVERY 5TH VISIT.  PROGRESS NOTE AT 10TH VISIT.  KX MODIFIER AFTER 15 VISITS.    PT Start Time 1036    PT Stop Time 1115    PT Time Calculation (min) 39 min    Equipment Utilized During Treatment --    Activity Tolerance Patient tolerated treatment well    Behavior During Therapy WFL for tasks assessed/performed             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   Diabetes mellitus without complication ([place])    Family history of breast cancer    Family history of colon cancer    Family history of ovarian cancer    Fracture of knee region 10/2014   Bilateral    Gastric polyp    Gastroparesis    GERD (gastroesophageal reflux disease)    Hepatic steatosis 05/09/2019   By ultrasound 04/2019   Hiatal hernia    History of kidney stones    Hypertension    Hypothyroidism    Irritable bowel syndrome    Lactose intolerance    RSD (reflex sympathetic dystrophy)     Past Surgical History:  Procedure Laterality Date   BREAST BIOPSY Left    BREAST EXCISIONAL BIOPSY Right    breast surgery cluster cyst removed     x 2   CARPAL TUNNEL RELEASE Left 2019   cleft palate repaired  birth, 1983   at [place] Right 11/04/2016   [place], 2003   KNEE ARTHROSCOPY Bilateral 11/2014   [facility]    KNEE SURGERY Left 1976   TOTAL KNEE ARTHROPLASTY Right 01/11/2021   Procedure: TOTAL KNEE ARTHROPLASTY;  Surgeon: Netta Cedars, MD;  Location: [facility];  Service: Orthopedics;  Laterality: Right;  with block   TUBAL LIGATION  1987   URETHRAL SLING  03/2012   mid-urethral sling   Bladder    There were no vitals filed for this visit.   Subjective Assessment - 02/08/21 1037     Subjective COVID-19 screen performed prior to patient entering clinic. Patient reports that her knee is doing great today. She notes that her knee is a little sore and tight this morning as she stumbled while doing her lunging exercises.    Pertinent History Left knee surgery, right thumb pain, HTN, hypothyroidism, DM, RSD, CTS    How long can you walk comfortably? Around home with FWW.Marland Kitchen    Patient Stated Goals Get back to normal.  Do things without right knee pain.    Currently in Pain? No/denies    Pain Location Knee    Pain Orientation Right    Pain Descriptors / Indicators Tightness;Sore  [facility] Adult PT Treatment/Exercise - 02/08/21 0001       Exercises   Exercises Knee/Hip      Knee/Hip Exercises: Aerobic   Recumbent Bike L2, seat 4 x15 min      Knee/Hip Exercises: Standing   Hip Extension Right;AROM;20 reps;Knee bent   5 second hold   Lateral Step Up Right;2 sets;10 reps;Step Height: 6"   with heel tap   Forward Step Up Both;2 sets;10 reps;Hand Hold: 2   onto BOSU with march   Functional Squat 2 sets;10 reps   with tap on elevated table                Balance Exercises - 02/08/21 0001       Balance Exercises: Standing   Other Standing Exercises BOSU circles   CW and CCW; 2 minutes; BUE support                    PT Long Term Goals - 01/23/21 1412       PT LONG TERM GOAL #1   Title Independent with a HEP.    Time 4    Period Weeks    Status On-going      PT LONG TERM GOAL #2   Title Full right active knee extension in  order to normalize gait.    Time 4    Period Weeks    Status On-going      PT LONG TERM GOAL #3   Title Active right knee flexion to 115 degrees+ so the patient can perform functional tasks and do so with pain not > 2-3/10.    Time 4    Period Weeks    Status On-going      PT LONG TERM GOAL #4   Title Increase right hip and knee strength to a solid 4+/5 to provide good stability for accomplishment of functional activities.    Time 4    Period Weeks    Status On-going      PT LONG TERM GOAL #5   Title Perform a reciprocating stair gait with one railing with pain not > 2-3/10.    Time 4    Period Weeks    Status On-going                   Plan - 02/08/21 1041     Clinical Impression Statement Patient was progressed with multiple new and familiar interventions for improved knee strength and stability with minimal difficulty and fatigue. She required minimal cuing with standing hip extension for lumbar stability to isolate hip extension and limiting lumbar flexion. This activity was also able to significantly reduce the right knee soreness and tightness she presented with today. She reported feeling a lot better upon the conclusion of treatment. She would benefit from continued physical therapy to address her remaining impairments to maximize her functional mobility.    Personal Factors and Comorbidities Comorbidity 1;Comorbidity 2;Other    Comorbidities Left knee surgery, right thumb pain, HTN, hypothyroidism, DM, RSD, CTS    Examination-Activity Limitations Other;Transfers;Locomotion Level;Bathing;Stairs    Examination-Participation Restrictions Other    Stability/Clinical Decision Making Stable/Uncomplicated    Rehab Potential Excellent    PT Frequency 3x / week    PT Duration 4 weeks    PT Treatment/Interventions ADLs/Self Care Home Management;Cryotherapy;Electrical Stimulation;Moist Heat;Gait training;Stair training;Functional mobility training;Therapeutic  activities;Therapeutic exercise;Neuromuscular re-education;Manual techniques;Patient/family education;Passive range of motion    PT Next Visit Plan Continue progressive strengthening and balance activities.  Consulted and Agree with Plan of Care Patient             Patient will benefit from skilled therapeutic intervention in order to improve the following deficits and impairments:  Abnormal gait, Decreased activity tolerance, Decreased range of motion, Decreased strength, Increased edema, Pain  Visit Diagnosis: Chronic pain of right knee  Stiffness of right knee, not elsewhere classified  Localized edema  Muscle weakness (generalized)     Problem List Patient Active Problem List   Diagnosis Date Noted   Status post total knee replacement, right 01/11/2021   Hepatic steatosis 05/09/2019   Fibroid uterus 03/18/2018   Mixed hyperlipidemia 03/18/2018   Gastric polyp 03/18/2018   Chronic narcotic use 03/18/2018   Essential hypertension 10/06/2017   Acquired hypothyroidism 08/25/2017   Controlled type 2 diabetes mellitus without complication, without long-term current use of insulin ([place]) 12/15/2016   Genetic testing 08/01/2014   Family history of breast cancer    Family history of ovarian cancer    Family history of colon cancer    Nephrolithiasis 05/05/2013   Exogenous obesity 07/22/2012   B12 deficiency 09/12/2009   Reflex sympathetic dystrophy 01/18/2008   GERD 01/18/2008   LACTOSE INTOLERANCE 12/23/2007   Irritable bowel syndrome 12/23/2007    Darlin Coco, PT 02/08/2021, 11:26 AM  [facility] [address] [place], [place], [address] Phone: [phone]   Fax:  [phone]  Name: Haley Jenkins MRN: [id number] Date of Birth: Nov 24, 1950

## 2021-02-15 ENCOUNTER — Ambulatory Visit: Payer: Medicare Other

## 2021-02-15 ENCOUNTER — Other Ambulatory Visit: Payer: Self-pay

## 2021-02-15 DIAGNOSIS — M25561 Pain in right knee: Secondary | ICD-10-CM | POA: Diagnosis not present

## 2021-02-15 DIAGNOSIS — M25661 Stiffness of right knee, not elsewhere classified: Secondary | ICD-10-CM

## 2021-02-15 DIAGNOSIS — G8929 Other chronic pain: Secondary | ICD-10-CM

## 2021-02-15 DIAGNOSIS — M6281 Muscle weakness (generalized): Secondary | ICD-10-CM

## 2021-02-15 NOTE — Therapy (Signed)
[facility] [place], [place], [address] Phone: [phone]   Fax:  [phone]  Physical Therapy Treatment  Patient Details  Name: Haley Jenkins MRN: [id number] Date of Birth: Jun 22, 1950 Referring Provider (PT): Esmond Plants MD   Encounter Date: 02/15/2021   PT End of Session - 02/15/21 1044     Visit Number 9    Number of Visits 12    Date for PT Re-Evaluation 02/13/21    Authorization Type FOTO AT LEAST EVERY 5TH VISIT.  PROGRESS NOTE AT 10TH VISIT.  KX MODIFIER AFTER 15 VISITS.    PT Start Time 1030    PT Stop Time 1116    PT Time Calculation (min) 46 min    Activity Tolerance Patient tolerated treatment well    Behavior During Therapy WFL for tasks assessed/performed             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   Diabetes mellitus without complication ([place])    Family history of breast cancer    Family history of colon cancer    Family history of ovarian cancer    Fracture of knee region 10/2014   Bilateral    Gastric polyp    Gastroparesis    GERD (gastroesophageal reflux disease)    Hepatic steatosis 05/09/2019   By ultrasound 04/2019   Hiatal hernia    History of kidney stones    Hypertension    Hypothyroidism    Irritable bowel syndrome    Lactose intolerance    RSD (reflex sympathetic dystrophy)     Past Surgical History:  Procedure Laterality Date   BREAST BIOPSY Left    BREAST EXCISIONAL BIOPSY Right    breast surgery cluster cyst removed     x 2   CARPAL TUNNEL RELEASE Left 2019   cleft palate repaired  birth, 1984   at [place] Right 11/04/2016   [place], 2003   KNEE ARTHROSCOPY Bilateral 11/2014   [facility]   KNEE SURGERY Left 1976   TOTAL KNEE  ARTHROPLASTY Right 01/11/2021   Procedure: TOTAL KNEE ARTHROPLASTY;  Surgeon: Netta Cedars, MD;  Location: [facility];  Service: Orthopedics;  Laterality: Right;  with block   TUBAL LIGATION  1987   URETHRAL SLING  03/2012   mid-urethral sling   Bladder    There were no vitals filed for this visit.   Subjective Assessment - 02/15/21 1031     Subjective COVID-19 screen performed prior to patient entering clinic. Patient reports that she was able to walk around her yard for about 2 hours yesterday. She notes this made her knee a little sore and tired, but no pain.    Pertinent History Left knee surgery, right thumb pain, HTN, hypothyroidism, DM, RSD, CTS    How long can you walk comfortably? Around home with FWW.Marland Kitchen    Patient Stated Goals Get back to normal.  Do things without right knee pain.    Currently in Pain? No/denies                               [facility] Adult PT Treatment/Exercise - 02/15/21 0001  Knee/Hip Exercises: Aerobic   Recumbent Bike L1-3 x 15 minutes      Knee/Hip Exercises: Standing   Forward Lunges 1 set;20 reps;Both   with trunk rotation toward leading LE   Lateral Step Up Both;2 sets;15 reps;Hand Hold: 2   onto BOSU   Forward Step Up Both;2 sets;10 reps;Hand Hold: 2   onto BOSU   Step Down 1 set;20 reps;Right;Hand Hold: 2;Step Height: 6"   with heel tap   Rocker Board 3 minutes   5 second hold     Manual Therapy   Manual Therapy Soft tissue mobilization    Soft tissue mobilization quad and along scar                 Balance Exercises - 02/15/21 0001       Balance Exercises: Standing   SLS Eyes open;Foam/compliant surface;Intermittent upper extremity support;4 reps;30 secs                     PT Long Term Goals - 01/23/21 1412       PT LONG TERM GOAL #1   Title Independent with a HEP.    Time 4    Period Weeks    Status On-going      PT LONG TERM GOAL #2   Title Full right active knee extension in order to  normalize gait.    Time 4    Period Weeks    Status On-going      PT LONG TERM GOAL #3   Title Active right knee flexion to 115 degrees+ so the patient can perform functional tasks and do so with pain not > 2-3/10.    Time 4    Period Weeks    Status On-going      PT LONG TERM GOAL #4   Title Increase right hip and knee strength to a solid 4+/5 to provide good stability for accomplishment of functional activities.    Time 4    Period Weeks    Status On-going      PT LONG TERM GOAL #5   Title Perform a reciprocating stair gait with one railing with pain not > 2-3/10.    Time 4    Period Weeks    Status On-going                   Plan - 02/15/21 1045     Clinical Impression Statement Patient was progressed with familiar interventions for improved lower extremity stability and function with her daily activities. She required minimal cuing with lateral step downs to limit her trunk flexion for imporved upright posture. Manual therapy focused on improved scar and quadriceps mobility through the use of soft tissue mobilization with this being highly effective at reducing her dicomfort along her scar. She reported that her knee felt a lot better upon the conclusion of treatment. She continues to benefit from skilled physical therapy to address her remaining impairments to return to her prior level of function.    Personal Factors and Comorbidities Comorbidity 1;Comorbidity 2;Other    Comorbidities Left knee surgery, right thumb pain, HTN, hypothyroidism, DM, RSD, CTS    Examination-Activity Limitations Other;Transfers;Locomotion Level;Bathing;Stairs    Examination-Participation Restrictions Other    Stability/Clinical Decision Making Stable/Uncomplicated    Rehab Potential Excellent    PT Frequency 3x / week    PT Duration 4 weeks    PT Treatment/Interventions ADLs/Self Care Home Management;Cryotherapy;Electrical Stimulation;Moist Heat;Gait training;Stair training;Functional  mobility training;Therapeutic activities;Therapeutic exercise;Neuromuscular  re-education;Manual techniques;Patient/family education;Passive range of motion    PT Next Visit Plan Continue progressive strengthening and balance activities, progress report    PT Home Exercise Plan Scar mobilization    Consulted and Agree with Plan of Care Patient             Patient will benefit from skilled therapeutic intervention in order to improve the following deficits and impairments:  Abnormal gait, Decreased activity tolerance, Decreased range of motion, Decreased strength, Increased edema, Pain  Visit Diagnosis: Chronic pain of right knee  Stiffness of right knee, not elsewhere classified  Muscle weakness (generalized)     Problem List Patient Active Problem List   Diagnosis Date Noted   Status post total knee replacement, right 01/11/2021   Hepatic steatosis 05/09/2019   Fibroid uterus 03/18/2018   Mixed hyperlipidemia 03/18/2018   Gastric polyp 03/18/2018   Chronic narcotic use 03/18/2018   Essential hypertension 10/06/2017   Acquired hypothyroidism 08/25/2017   Controlled type 2 diabetes mellitus without complication, without long-term current use of insulin ([place]) 12/15/2016   Genetic testing 08/01/2014   Family history of breast cancer    Family history of ovarian cancer    Family history of colon cancer    Nephrolithiasis 05/05/2013   Exogenous obesity 07/22/2012   B12 deficiency 09/12/2009   Reflex sympathetic dystrophy 01/18/2008   GERD 01/18/2008   LACTOSE INTOLERANCE 12/23/2007   Irritable bowel syndrome 12/23/2007    Darlin Coco, PT 02/15/2021, 11:40 AM  [facility] [place], [place], [address] Phone: [phone]   Fax:  [phone]  Name: TAIMI TOWE MRN: [id number] Date of Birth: 07-Feb-1951

## 2021-02-22 ENCOUNTER — Other Ambulatory Visit: Payer: Self-pay

## 2021-02-22 ENCOUNTER — Ambulatory Visit: Payer: Medicare Other | Admitting: *Deleted

## 2021-02-22 DIAGNOSIS — G8929 Other chronic pain: Secondary | ICD-10-CM

## 2021-02-22 DIAGNOSIS — M25561 Pain in right knee: Secondary | ICD-10-CM | POA: Diagnosis not present

## 2021-02-22 DIAGNOSIS — M6281 Muscle weakness (generalized): Secondary | ICD-10-CM

## 2021-02-22 DIAGNOSIS — R600 Localized edema: Secondary | ICD-10-CM

## 2021-02-22 DIAGNOSIS — M25661 Stiffness of right knee, not elsewhere classified: Secondary | ICD-10-CM

## 2021-02-22 NOTE — Therapy (Addendum)
[facility] [place], [place], [address] Phone: [phone]   Fax:  [phone]  Physical Therapy Treatment  Patient Details  Name: Haley Jenkins MRN: [id number] Date of Birth: 03/10/1951 Referring Provider (PT): Esmond Plants MD   Encounter Date: 02/22/2021   PT End of Session - 02/22/21 1046     Visit Number 10    Number of Visits 12    Date for PT Re-Evaluation 02/13/21    Authorization Type FOTO AT LEAST EVERY 5TH VISIT.  PROGRESS NOTE AT 10TH VISIT.  KX MODIFIER AFTER 15 VISITS.    PT Start Time 1030    PT Stop Time 1120    PT Time Calculation (min) 50 min    Activity Tolerance Patient tolerated treatment well    Behavior During Therapy WFL for tasks assessed/performed             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   Diabetes mellitus without complication ([place])    Family history of breast cancer    Family history of colon cancer    Family history of ovarian cancer    Fracture of knee region 10/2014   Bilateral    Gastric polyp    Gastroparesis    GERD (gastroesophageal reflux disease)    Hepatic steatosis 05/09/2019   By ultrasound 04/2019   Hiatal hernia    History of kidney stones    Hypertension    Hypothyroidism    Irritable bowel syndrome    Lactose intolerance    RSD (reflex sympathetic dystrophy)     Past Surgical History:  Procedure Laterality Date   BREAST BIOPSY Left    BREAST EXCISIONAL BIOPSY Right    breast surgery cluster cyst removed     x 2   CARPAL TUNNEL RELEASE Left 2019   cleft palate repaired  birth, 1984   at [place] Right 11/04/2016   [place], 2003   KNEE ARTHROSCOPY Bilateral 11/2014   [facility]   KNEE SURGERY Left 1976   TOTAL KNEE  ARTHROPLASTY Right 01/11/2021   Procedure: TOTAL KNEE ARTHROPLASTY;  Surgeon: Netta Cedars, MD;  Location: [facility];  Service: Orthopedics;  Laterality: Right;  with block   TUBAL LIGATION  1987   URETHRAL SLING  03/2012   mid-urethral sling   Bladder    There were no vitals filed for this visit.   Subjective Assessment - 02/22/21 1042     Subjective COVID-19 screen performed prior to patient entering clinic. To MD on Tuesday    Pertinent History Left knee surgery, right thumb pain, HTN, hypothyroidism, DM, RSD, CTS    How long can you walk comfortably? Around home with FWW.Marland Kitchen    Patient Stated Goals Get back to normal.  Do things without right knee pain.    Currently in Pain? Yes    Pain Score 2     Pain Location Knee    Pain Orientation Right    Pain Descriptors / Indicators Burning    Pain Type Surgical pain    Pain Onset More than a month ago  [facility] Adult PT Treatment/Exercise - 02/22/21 0001       Exercises   Exercises Knee/Hip      Knee/Hip Exercises: Aerobic   Recumbent Bike L1-3 x 15 minutes      Knee/Hip Exercises: Standing   Forward Lunges 1 set;20 reps;Right;3 seconds   8in step   Lateral Step Up Both;2 sets;15 reps;Hand Hold: 2;Step Height: 6"    Forward Step Up Both;2 sets;10 reps;Hand Hold: 2    Step Down 1 set;20 reps;Right;Hand Hold: 2;Step Height: 4"   with heel tap   Rocker Board 3 minutes   5 second hold     Manual Therapy   Manual Therapy Soft tissue mobilization    Manual therapy comments 0-120 PROM today,AROM 5-118 degrees    Soft tissue mobilization quad and along scar    Passive ROM In supine:  PROM patella mobs, flexion and extension stretching                          PT Long Term Goals - 02/22/21 1222       PT LONG TERM GOAL #1   Title Independent with a HEP.    Time 4    Period Weeks    Status Partially Met      PT LONG TERM GOAL #2   Title Full right active knee  extension in order to normalize gait.    Baseline -5 degrees    Time 4    Period Weeks    Status Partially Met      PT LONG TERM GOAL #3   Title Active right knee flexion to 115 degrees+ so the patient can perform functional tasks and do so with pain not > 2-3/10.    Status Achieved      PT LONG TERM GOAL #4   Title Increase right hip and knee strength to a solid 4+/5 to provide good stability for accomplishment of functional activities.    Time 4    Period Weeks    Status Partially Met      PT LONG TERM GOAL #5   Title Perform a reciprocating stair gait with one railing with pain not > 2-3/10.    Time 4    Period Weeks    Status Achieved                   Plan - 02/22/21 1047     Clinical Impression Statement Pt arrived doing very well with RT knee with minimal pain. Rx focused on strengthening, proprioception, and ROM. She continues to improve with exs/ act's. AROM today was 5-118 and PROM 0-120 degrees. Improved balance with SLS today    Personal Factors and Comorbidities Comorbidity 1;Comorbidity 2;Other    Comorbidities Left knee surgery, right thumb pain, HTN, hypothyroidism, DM, RSD, CTS    Examination-Activity Limitations Other;Transfers;Locomotion Level;Bathing;Stairs    Stability/Clinical Decision Making Stable/Uncomplicated    Rehab Potential Excellent    PT Frequency 3x / week    PT Treatment/Interventions ADLs/Self Care Home Management;Cryotherapy;Electrical Stimulation;Moist Heat;Gait training;Stair training;Functional mobility training;Therapeutic activities;Therapeutic exercise;Neuromuscular re-education;Manual techniques;Patient/family education;Passive range of motion    PT Next Visit Plan Continue progressive strengthening and balance activities, progress report    PT Home Exercise Plan Scar mobilization    Consulted and Agree with Plan of Care Patient             Patient will benefit from skilled therapeutic intervention in order to improve the  following deficits  and impairments:  Abnormal gait, Decreased activity tolerance, Decreased range of motion, Decreased strength, Increased edema, Pain  Visit Diagnosis: Chronic pain of right knee  Stiffness of right knee, not elsewhere classified  Muscle weakness (generalized)  Localized edema     Problem List Patient Active Problem List   Diagnosis Date Noted   Status post total knee replacement, right 01/11/2021   Hepatic steatosis 05/09/2019   Fibroid uterus 03/18/2018   Mixed hyperlipidemia 03/18/2018   Gastric polyp 03/18/2018   Chronic narcotic use 03/18/2018   Essential hypertension 10/06/2017   Acquired hypothyroidism 08/25/2017   Controlled type 2 diabetes mellitus without complication, without long-term current use of insulin ([place]) 12/15/2016   Genetic testing 08/01/2014   Family history of breast cancer    Family history of ovarian cancer    Family history of colon cancer    Nephrolithiasis 05/05/2013   Exogenous obesity 07/22/2012   B12 deficiency 09/12/2009   Reflex sympathetic dystrophy 01/18/2008   GERD 01/18/2008   LACTOSE INTOLERANCE 12/23/2007   Irritable bowel syndrome 12/23/2007    Sherrill Buikema,CHRIS, PTA 02/22/2021, 12:24 PM  [facility] [address] [place], [place], [address] Phone: [phone]   Fax:  [phone]  Name: Haley Jenkins MRN: [id number] Date of Birth: December 04, 1950  PHYSICAL THERAPY DISCHARGE SUMMARY  Visits from Start of Care: 10  Current functional level related to goals / functional outcomes: Patient was able to mostly meet all of her goals for physical therapy. She did not complete her plan of care as she only attended 10 of the recommended 12 visits.    Remaining deficits: Right knee weakness    Education / Equipment: HEP    Patient agrees to discharge. Patient goals were partially met. Patient is being discharged due to not returning since the last visit.   Jacqulynn Cadet,  PT, DPT

## 2021-05-05 HISTORY — PX: OTHER SURGICAL HISTORY: SHX169

## 2021-05-21 ENCOUNTER — Other Ambulatory Visit: Payer: Self-pay | Admitting: Family Medicine

## 2021-05-21 DIAGNOSIS — I10 Essential (primary) hypertension: Secondary | ICD-10-CM

## 2021-05-22 ENCOUNTER — Telehealth: Payer: Self-pay | Admitting: *Deleted

## 2021-05-22 NOTE — Telephone Encounter (Signed)
Patient sent appointment request to schedule annual with Dr. Sabra Heck. Office called to inform patient that Dr. Sabra Heck will not be in [place] and to call [facility] location to schedule. Patient states that she has sent Dr. Sabra Heck 2 unreturned MyChart messages and has called the office to schedule, but has not been able to speak with anyone. Patient was placed on hold while [facility] [place] office contacted [place] office. Patient will be called back by [facility] office after other calls have been completed per front office staff member. Patient informed and agreed.

## 2021-05-27 ENCOUNTER — Ambulatory Visit (INDEPENDENT_AMBULATORY_CARE_PROVIDER_SITE_OTHER): Payer: Medicare Other | Admitting: Obstetrics & Gynecology

## 2021-05-27 ENCOUNTER — Encounter (HOSPITAL_BASED_OUTPATIENT_CLINIC_OR_DEPARTMENT_OTHER): Payer: Self-pay | Admitting: Obstetrics & Gynecology

## 2021-05-27 ENCOUNTER — Other Ambulatory Visit (HOSPITAL_COMMUNITY)
Admission: RE | Admit: 2021-05-27 | Discharge: 2021-05-27 | Disposition: A | Discharge status: Home / Self Care | Payer: Medicare Other | Source: Ambulatory Visit | Attending: Obstetrics & Gynecology | Admitting: Obstetrics & Gynecology

## 2021-05-27 ENCOUNTER — Other Ambulatory Visit: Payer: Self-pay

## 2021-05-27 VITALS — BP 127/59 | HR 89 | Ht 66.0 in | Wt 223.2 lb

## 2021-05-27 DIAGNOSIS — Z1151 Encounter for screening for human papillomavirus (HPV): Secondary | ICD-10-CM | POA: Diagnosis not present

## 2021-05-27 DIAGNOSIS — M8580 Other specified disorders of bone density and structure, unspecified site: Secondary | ICD-10-CM

## 2021-05-27 DIAGNOSIS — E119 Type 2 diabetes mellitus without complications: Secondary | ICD-10-CM

## 2021-05-27 DIAGNOSIS — Z124 Encounter for screening for malignant neoplasm of cervix: Secondary | ICD-10-CM

## 2021-05-27 DIAGNOSIS — Z9189 Other specified personal risk factors, not elsewhere classified: Secondary | ICD-10-CM

## 2021-05-27 DIAGNOSIS — Z8041 Family history of malignant neoplasm of ovary: Secondary | ICD-10-CM | POA: Diagnosis not present

## 2021-05-27 DIAGNOSIS — Z01419 Encounter for gynecological examination (general) (routine) without abnormal findings: Secondary | ICD-10-CM

## 2021-05-27 DIAGNOSIS — E039 Hypothyroidism, unspecified: Secondary | ICD-10-CM

## 2021-05-27 DIAGNOSIS — Z803 Family history of malignant neoplasm of breast: Secondary | ICD-10-CM | POA: Diagnosis not present

## 2021-05-27 DIAGNOSIS — I10 Essential (primary) hypertension: Secondary | ICD-10-CM

## 2021-05-27 NOTE — Progress Notes (Signed)
71 y.o. [id number] Married White or Caucasian female here for breast and pelvic exam.  I am also following her for increased risk of breast cancer.  Denies vaginal bleeding.  MMG 11/2020 and breast MRI 01/2020.  Patient's last menstrual period was 05/05/1996.          Sexually active: No.  H/O STD:  no  Health Maintenance: PCP:  Dr. Francesco Sor.  Last wellness appt was 10/2012.  Did blood work at that appt:  yes Vaccines are up to date:  has not done varicella vaccination Colonoscopy:  02/16/2020, follow up 10 years MMG:  11/12/2020 Negative BMD:  11/16/2017, follow up 4 years. Last pap smear:  01/04/2019 Negative.   H/o abnormal pap smear:  no    reports that she has never smoked. She has never used smokeless tobacco. She reports that she does not drink alcohol and does not use drugs.  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   Diabetes mellitus without complication ([place])    Family history of breast cancer    Family history of colon cancer    Family history of ovarian cancer    Fracture of knee region 10/2014   Bilateral    Gastric polyp    Gastroparesis    GERD (gastroesophageal reflux disease)    Hepatic steatosis 05/09/2019   By ultrasound 04/2019   Hiatal hernia    History of kidney stones    Hypertension    Hypothyroidism    Irritable bowel syndrome    Lactose intolerance    RSD (reflex sympathetic dystrophy)     Past Surgical History:  Procedure Laterality Date   BREAST BIOPSY Left    BREAST EXCISIONAL BIOPSY Right    breast surgery cluster cyst removed     x 2   CARPAL TUNNEL RELEASE Left 2019   CATARACT EXTRACTION W/ INTRAOCULAR LENS IMPLANT  02/05/2021   right on 10/4 and left on 10/11   cleft palate repaired  birth, 1984   at [place] Right 11/04/2016   [place], 2003   KNEE ARTHROSCOPY Bilateral 11/2014   [facility]   KNEE SURGERY Left 1976   TOTAL KNEE ARTHROPLASTY Right 01/11/2021   Procedure: TOTAL KNEE ARTHROPLASTY;  Surgeon: Netta Cedars, MD;  Location: [facility];  Service: Orthopedics;  Laterality: Right;  with block   TUBAL LIGATION  1987   URETHRAL SLING  03/2012   mid-urethral sling   Bladder    Current Outpatient Medications  Medication Sig Dispense Refill   acetaminophen (TYLENOL) 325 MG tablet Take 1-2 tablets (325-650 mg total) by mouth every 6 (six) hours as needed for mild pain (pain score 1-3 or temp > 100.5).     Calcium-Magnesium-Vitamin D (CALCIUM MAGNESIUM PO) Take 1,500 mg by mouth daily.      Cholecalciferol (VITAMIN D-3) 5000 units TABS Take 5,000 Units by mouth daily.     cyanocobalamin (,VITAMIN B-12,) 1000 MCG/ML injection INJECT 1 ML INTRAMUSCULARLY ONCE EVERY MONTH 3 mL 0   cyclobenzaprine (FLEXERIL) 5 MG tablet Take 1 tablet (5 mg total) by mouth 3 (three) times daily as needed for muscle spasms. 30 tablet 0   diclofenac Sodium (VOLTAREN) 1 % GEL Apply 2 g topically 4 (  four) times daily as needed (pain).     Eszopiclone 3 MG TABS Take 3 mg by mouth at bedtime. Take immediately before bedtime     levothyroxine (SYNTHROID) 50 MCG tablet Take 1 tablet (50 mcg total) by mouth daily. 90 tablet 3   lisinopril-hydrochlorothiazide (ZESTORETIC) 20-25 MG tablet TAKE 1 TABLET DAILY 90 tablet 3   oxyCODONE (ROXICODONE) 15 MG immediate release tablet Take 15 mg by mouth 4 (four) times daily as needed for pain.     pantoprazole (PROTONIX) 40 MG tablet TAKE 1 TABLET DAILY 90 tablet 3   promethazine (PHENERGAN) 25 MG suppository Place 1 suppository (25 mg total) rectally every 6 (six) hours as needed for nausea or vomiting. 12 each 2   SYRINGE-NEEDLE, DISP, 3 ML (B-D 3CC LUER-LOK SYR 25GX1") 25G X 1" 3 ML MISC USE TO INJECT VITAMIN B12 EVERY 30 DAYS 12 each 2   triamcinolone cream (KENALOG) 0.5 % Apply 1 application  topically 3 (three) times daily as needed (eczema).     FLAREX 0.1 % ophthalmic suspension Place 1 drop into both eyes 4 (four) times daily. (Patient not taking: Reported on 05/27/2021)     fluticasone (FLONASE) 50 MCG/ACT nasal spray PLACE TWO SPRAYS INTO THE NOSE DAILY (Patient not taking: Reported on 12/27/2020) 16 g 5   No current facility-administered medications for this visit.    Family History  Problem Relation Age of Onset   Kidney disease Mother    Osteoporosis Mother    Heart attack Mother        pacemaker   Other Mother 17       meningioma-removed on 05/26/13   Ovarian cancer Mother 10   Brain cancer Mother 20       meningioma   COPD Father    Lung cancer Father 50   Breast cancer Sister 46       40 at diagnosis.  Recurrence 2017 at 70 yo.   Irritable bowel syndrome Daughter    Heart disease Maternal Grandfather    Breast cancer Other        MGM's two sisters   Leukemia Maternal Grandmother    Breast cancer Maternal Aunt 30   Brain cancer Maternal Uncle    Colon cancer Paternal Aunt    Stroke Paternal Grandfather    Colon cancer Sister 36   Breast cancer Maternal Aunt    Colon cancer Maternal Aunt    Lymphoma Maternal Aunt    Breast cancer Maternal Aunt    Brain cancer Maternal Aunt    Lung cancer Maternal Uncle        smoker   Colon cancer Maternal Uncle    Breast cancer Other        MGM's mother   Breast cancer Cousin 58       maternal first cousin   Esophageal cancer Neg Hx    Rectal cancer Neg Hx    Stomach cancer Neg Hx     Review of Systems  Exam:   BP (!) 127/59 (BP Location: Left Arm, Patient Position: Sitting, Cuff Size: Large)    Pulse 89    Ht '5\' 6"'  (1.676 m)    Wt 223 lb 3.2 oz (101.2 kg)    LMP 05/05/1996    BMI 36.03 kg/m   Height: '5\' 6"'  (167.6 cm)  General appearance: alert, cooperative and appears stated age Breasts: normal appearance, no masses or tenderness Abdomen: soft, non-tender; bowel sounds normal; no masses,  no  organomegaly Lymph nodes: Cervical,  supraclavicular, and axillary nodes normal.  No abnormal inguinal nodes palpated Neurologic: Grossly normal  Pelvic: External genitalia:  no lesions              Urethra:  normal appearing urethra with no masses, tenderness or lesions              Bartholins and Skenes: normal                 Vagina: normal appearing vagina with atrophic changes and no discharge, no lesions              Cervix: no lesions              Pap taken: Yes.   Bimanual Exam:  Uterus:  normal size, contour, position, consistency, mobility, non-tender              Adnexa: normal adnexa and no mass, fullness, tenderness               Rectovaginal: Confirms               Anus:  normal sphincter tone, no lesions  Chaperone, Octaviano Batty, CMA, was present for exam.  Assessment/Plan: 1. Encntr for gyn exam (general) (routine) w/o abn findings - pap obtained today - MMG up to date - colonoscopy 02/2020 - BMD 2019, plan to repeat this year - vaccines reviewed/updated - lab work done with Dr. Francesco Sor  2. Cervical cancer screening - Cytology - PAP( [facility]) - PR OBTAINING SCREEN PAP SMEAR  3. Osteopenia, unspecified location - DG DXA FRACTURE ASSESSMENT; Future  4. Increased risk of breast cancer - doing breast MRI every other year.  Last done 01/2020.  Plan again this year.    5. Family history of ovarian cancer  6. Family history of breast cancer  7. Controlled type 2 diabetes mellitus without complication, without long-term current use of insulin ([place]), Acquired hypothyroidism, Essential hypertension - followed by Dr. Francesco Sor

## 2021-05-30 LAB — CYTOLOGY - PAP
Adequacy: ABSENT
Comment: NEGATIVE
Diagnosis: UNDETERMINED — AB
High risk HPV: NEGATIVE

## 2021-09-18 ENCOUNTER — Encounter (HOSPITAL_BASED_OUTPATIENT_CLINIC_OR_DEPARTMENT_OTHER): Payer: Self-pay | Admitting: Obstetrics & Gynecology

## 2021-09-18 ENCOUNTER — Other Ambulatory Visit (HOSPITAL_BASED_OUTPATIENT_CLINIC_OR_DEPARTMENT_OTHER): Payer: Self-pay | Admitting: *Deleted

## 2021-09-18 ENCOUNTER — Telehealth: Payer: Self-pay | Admitting: Gastroenterology

## 2021-09-18 MED ORDER — LEVOTHYROXINE SODIUM 50 MCG PO TABS: 50.0000 ug | ORAL_TABLET | Freq: Every day | ORAL | 1 refills | Status: DC

## 2021-09-18 NOTE — Telephone Encounter (Signed)
Inbound call from patient stating she needs a refill for Phenergan. Please advise.  ?

## 2021-09-18 NOTE — Progress Notes (Signed)
Pt needing refill on levothyroxine. Rx sent to pharmacy ?

## 2021-09-19 NOTE — Telephone Encounter (Signed)
Patient called back. Patient states she will be leaving out of the country in two weeks for 6 weeks and wants to have enough medication while on vacation. Please call back to advise.

## 2021-09-19 NOTE — Telephone Encounter (Signed)
Patient has been instructed to schedule a follow up. She will follow up with her primary care for possible refills.

## 2021-09-19 NOTE — Telephone Encounter (Signed)
Left message to make an appointment

## 2021-12-03 ENCOUNTER — Other Ambulatory Visit (HOSPITAL_BASED_OUTPATIENT_CLINIC_OR_DEPARTMENT_OTHER): Payer: Self-pay | Admitting: Obstetrics & Gynecology

## 2021-12-03 DIAGNOSIS — Z9189 Other specified personal risk factors, not elsewhere classified: Secondary | ICD-10-CM

## 2021-12-03 DIAGNOSIS — Z803 Family history of malignant neoplasm of breast: Secondary | ICD-10-CM

## 2021-12-04 ENCOUNTER — Other Ambulatory Visit: Payer: Self-pay | Admitting: Obstetrics & Gynecology

## 2021-12-04 DIAGNOSIS — Z1231 Encounter for screening mammogram for malignant neoplasm of breast: Secondary | ICD-10-CM

## 2021-12-13 ENCOUNTER — Ambulatory Visit
Admission: RE | Admit: 2021-12-13 | Discharge: 2021-12-13 | Disposition: A | Discharge status: Home / Self Care | Payer: Medicare Other | Source: Ambulatory Visit | Attending: Obstetrics & Gynecology | Admitting: Obstetrics & Gynecology

## 2021-12-13 DIAGNOSIS — Z1231 Encounter for screening mammogram for malignant neoplasm of breast: Secondary | ICD-10-CM

## 2021-12-16 ENCOUNTER — Encounter (HOSPITAL_BASED_OUTPATIENT_CLINIC_OR_DEPARTMENT_OTHER): Payer: Self-pay | Admitting: Obstetrics & Gynecology

## 2021-12-17 ENCOUNTER — Other Ambulatory Visit: Payer: Self-pay | Admitting: Obstetrics & Gynecology

## 2021-12-17 DIAGNOSIS — R928 Other abnormal and inconclusive findings on diagnostic imaging of breast: Secondary | ICD-10-CM

## 2021-12-19 ENCOUNTER — Ambulatory Visit
Admission: RE | Admit: 2021-12-19 | Discharge: 2021-12-19 | Disposition: A | Discharge status: Home / Self Care | Payer: Medicare Other | Source: Ambulatory Visit | Attending: Obstetrics & Gynecology | Admitting: Obstetrics & Gynecology

## 2021-12-19 DIAGNOSIS — M8580 Other specified disorders of bone density and structure, unspecified site: Secondary | ICD-10-CM

## 2021-12-23 NOTE — Telephone Encounter (Signed)
Pt called by CMA, Haley Jenkins.  Pt is having follow up imaging on 8/22 and then we will see if needs additional testing.  Pt does want to proceed with breast MRI this year as well.

## 2021-12-24 ENCOUNTER — Ambulatory Visit
Admission: RE | Admit: 2021-12-24 | Discharge: 2021-12-24 | Disposition: A | Discharge status: Home / Self Care | Payer: Medicare Other | Source: Ambulatory Visit | Attending: Obstetrics & Gynecology | Admitting: Obstetrics & Gynecology

## 2021-12-24 ENCOUNTER — Other Ambulatory Visit: Payer: Self-pay | Admitting: Obstetrics & Gynecology

## 2021-12-24 DIAGNOSIS — R928 Other abnormal and inconclusive findings on diagnostic imaging of breast: Secondary | ICD-10-CM

## 2022-01-02 ENCOUNTER — Encounter (HOSPITAL_BASED_OUTPATIENT_CLINIC_OR_DEPARTMENT_OTHER): Payer: Self-pay | Admitting: *Deleted

## 2022-01-09 ENCOUNTER — Ambulatory Visit
Admission: RE | Admit: 2022-01-09 | Discharge: 2022-01-09 | Disposition: A | Discharge status: Home / Self Care | Payer: Medicare Other | Source: Ambulatory Visit | Attending: Obstetrics & Gynecology | Admitting: Obstetrics & Gynecology

## 2022-01-09 DIAGNOSIS — Z803 Family history of malignant neoplasm of breast: Secondary | ICD-10-CM

## 2022-01-09 DIAGNOSIS — Z9189 Other specified personal risk factors, not elsewhere classified: Secondary | ICD-10-CM

## 2022-01-09 MED ORDER — GADOPICLENOL 0.5 MMOL/ML IV SOLN
10.0000 mL | Freq: Once | INTRAVENOUS | Status: AC | PRN
  Administered 2022-01-09: 10 mL via INTRAVENOUS

## 2022-03-14 ENCOUNTER — Other Ambulatory Visit (HOSPITAL_BASED_OUTPATIENT_CLINIC_OR_DEPARTMENT_OTHER): Payer: Self-pay | Admitting: Obstetrics & Gynecology

## 2022-06-02 ENCOUNTER — Ambulatory Visit (INDEPENDENT_AMBULATORY_CARE_PROVIDER_SITE_OTHER): Payer: Medicare Other | Admitting: Obstetrics & Gynecology

## 2022-06-02 ENCOUNTER — Encounter (HOSPITAL_BASED_OUTPATIENT_CLINIC_OR_DEPARTMENT_OTHER): Payer: Self-pay | Admitting: Obstetrics & Gynecology

## 2022-06-02 VITALS — BP 115/60 | HR 95 | Ht 65.75 in | Wt 226.0 lb

## 2022-06-02 DIAGNOSIS — Z780 Asymptomatic menopausal state: Secondary | ICD-10-CM | POA: Diagnosis not present

## 2022-06-02 DIAGNOSIS — D251 Intramural leiomyoma of uterus: Secondary | ICD-10-CM

## 2022-06-02 DIAGNOSIS — Z01419 Encounter for gynecological examination (general) (routine) without abnormal findings: Secondary | ICD-10-CM | POA: Diagnosis not present

## 2022-06-02 DIAGNOSIS — Z803 Family history of malignant neoplasm of breast: Secondary | ICD-10-CM | POA: Diagnosis not present

## 2022-06-02 DIAGNOSIS — E119 Type 2 diabetes mellitus without complications: Secondary | ICD-10-CM

## 2022-06-02 DIAGNOSIS — Z800 Family history of malignant neoplasm of digestive organs: Secondary | ICD-10-CM

## 2022-06-02 NOTE — Progress Notes (Signed)
72 y.o. [id number] Married White or Caucasian female here for breast and pelvic exam.  I am also following her for increased risk of breast cancer.  Last MMG was 12/2021 and MRI was 01/10/2022.    Denies vaginal bleeding.  Did a 6 weeks trip driving across the [place].  This was a great trip.  They drove around 8000 miles.    Patient's last menstrual period was 05/05/1996.          Sexually active: No.  H/O STD:  no  Health Maintenance: PCP:  Dr. Francesco Sor.  Last wellness appt was September.  Did blood work at that appt:  yes.  Does have follow up scheduled in March Vaccines are up to date:  pt is scheduled to do this with her pharmacy Colonoscopy:  02/16/2020, follow up 10 years MMG:  12/13/21 BMD:  12/27/20 Last pap smear:  05/27/21.  ASCUS with neg HR HPV H/o abnormal pap smear:  no    reports that she has never smoked. She has never used smokeless tobacco. She reports that she does not drink alcohol and does not use drugs.  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   Diabetes mellitus without complication ([place])    Family history of breast cancer    Family history of colon cancer    Family history of ovarian cancer    Fracture of knee region 10/2014   Bilateral    Gastric polyp    Gastroparesis    GERD (gastroesophageal reflux disease)    Hepatic steatosis 05/09/2019   By ultrasound 04/2019   Hiatal hernia    History of kidney stones    Hypertension    Hypothyroidism    Irritable bowel syndrome    Lactose intolerance    RSD (reflex sympathetic dystrophy)     Past Surgical History:  Procedure Laterality Date   BREAST BIOPSY Left    BREAST EXCISIONAL BIOPSY Right    breast surgery cluster cyst removed     x 2   CARPAL TUNNEL RELEASE Left 2019   CATARACT EXTRACTION W/ INTRAOCULAR LENS IMPLANT  02/05/2021   right on 10/4 and  left on 10/11   cleft palate repaired  birth, 1984   at [place] Right 11/04/2016   [place], 2003   KNEE ARTHROSCOPY Bilateral 11/2014   [facility]   KNEE SURGERY Left 1976   thumb surgery  05/2021   TOTAL KNEE ARTHROPLASTY Right 01/11/2021   Procedure: TOTAL KNEE ARTHROPLASTY;  Surgeon: Netta Cedars, MD;  Location: [facility];  Service: Orthopedics;  Laterality: Right;  with block   TUBAL LIGATION  1987   URETHRAL SLING  03/2012   mid-urethral sling   Bladder    Current Outpatient Medications  Medication Sig Dispense Refill   acetaminophen (TYLENOL) 325 MG tablet Take 1-2 tablets (325-650 mg total) by mouth every 6 (six) hours as needed for mild pain (pain score 1-3 or temp > 100.5).     Calcium-Magnesium-Vitamin D (CALCIUM MAGNESIUM PO) Take 1,500 mg by mouth daily.      Cholecalciferol (VITAMIN D-3) 5000 units TABS Take 5,000 Units by mouth daily.     cyanocobalamin (,VITAMIN B-12,) 1000 MCG/ML injection INJECT 1 ML INTRAMUSCULARLY ONCE  EVERY MONTH 3 mL 0   cyclobenzaprine (FLEXERIL) 5 MG tablet Take 1 tablet (5 mg total) by mouth 3 (three) times daily as needed for muscle spasms. 30 tablet 0   diclofenac Sodium (VOLTAREN) 1 % GEL Apply 2 g topically 4 (four) times daily as needed (pain).     Eszopiclone 3 MG TABS Take 3 mg by mouth at bedtime. Take immediately before bedtime     FLAREX 0.1 % ophthalmic suspension Place 1 drop into both eyes 4 (four) times daily.     fluticasone (FLONASE) 50 MCG/ACT nasal spray PLACE TWO SPRAYS INTO THE NOSE DAILY 16 g 5   levothyroxine (SYNTHROID) 50 MCG tablet TAKE 1 TABLET DAILY 90 tablet 0   lisinopril-hydrochlorothiazide (ZESTORETIC) 20-25 MG tablet TAKE 1 TABLET DAILY 90 tablet 3   oxyCODONE (ROXICODONE) 15 MG immediate release tablet Take 15 mg by mouth 4 (four) times daily as needed for pain.     pantoprazole (PROTONIX) 40 MG tablet TAKE 1 TABLET DAILY 90 tablet 3    promethazine (PHENERGAN) 25 MG suppository Place 1 suppository (25 mg total) rectally every 6 (six) hours as needed for nausea or vomiting. 12 each 2   SYRINGE-NEEDLE, DISP, 3 ML (B-D 3CC LUER-LOK SYR 25GX1") 25G X 1" 3 ML MISC USE TO INJECT VITAMIN B12 EVERY 30 DAYS 12 each 2   triamcinolone cream (KENALOG) 0.5 % Apply 1 application topically 3 (three) times daily as needed (eczema).     No current facility-administered medications for this visit.    Family History  Problem Relation Age of Onset   Kidney disease Mother    Osteoporosis Mother    Heart attack Mother        pacemaker   Other Mother 84       meningioma-removed on 05/26/13   Ovarian cancer Mother 36   Brain cancer Mother 69       meningioma   COPD Father    Lung cancer Father 58   Breast cancer Sister 10       40 at diagnosis.  Recurrence 2017 at 72 yo.   Irritable bowel syndrome Daughter    Heart disease Maternal Grandfather    Breast cancer Other        MGM's two sisters   Leukemia Maternal Grandmother    Breast cancer Maternal Aunt 39   Brain cancer Maternal Uncle    Colon cancer Paternal Aunt    Stroke Paternal Grandfather    Colon cancer Sister 30   Breast cancer Maternal Aunt    Colon cancer Maternal Aunt    Lymphoma Maternal Aunt    Breast cancer Maternal Aunt    Brain cancer Maternal Aunt    Lung cancer Maternal Uncle        smoker   Colon cancer Maternal Uncle    Breast cancer Other        MGM's mother   Breast cancer Cousin 62       maternal first cousin   Esophageal cancer Neg Hx    Rectal cancer Neg Hx    Stomach cancer Neg Hx     Review of Systems  Constitutional: Negative.   Genitourinary: Negative.     Exam:   BP 115/60   Pulse 95   Ht 5' 5.75" (1.67 m)   Wt 226 lb (102.5 kg)   LMP 05/05/1996   BMI 36.76 kg/m   Height: 5' 5.75" (167 cm)  General appearance: alert, cooperative and appears stated age Breasts: normal  appearance, no masses or tenderness Abdomen: soft, non-tender;  bowel sounds normal; no masses,  no organomegaly Lymph nodes: Cervical, supraclavicular, and axillary nodes normal.  No abnormal inguinal nodes palpated Neurologic: Grossly normal  Pelvic: External genitalia:  no lesions              Urethra:  normal appearing urethra with no masses, tenderness or lesions              Bartholins and Skenes: normal                 Vagina: normal appearing vagina with atrophic changes and no discharge, no lesions              Cervix: no lesions              Pap taken: No. Bimanual Exam:  Uterus:  normal size, contour, position, consistency, mobility, non-tender              Adnexa: normal adnexa and no mass, fullness, tenderness               Rectovaginal: Confirms               Anus:  normal sphincter tone, no lesions  Chaperone, Octaviano Batty, CMA, was present for exam.  Assessment/Plan: 1. Encntr for gyn exam (general) (routine) w/o abn findings - Pap smear ASCUS with neg HR HPV 2023.  Will repeat 3 years from that date. - Mammogram 12/2021.  MRI 01/2022.  Planning MRI again this year.  Will order this after MMG is done - Colonoscopy up to date - Bone mineral density 2022 - lab work done with PCP - vaccines reviewed/updated  2. Postmenopausal - no HRT  3. Intramural leiomyoma of uterus - US PELVIS TRANSVAGINAL NON-OB (TV ONLY); Future  4. Family history of breast cancer - did have negative genetic testing  5. Controlled type 2 diabetes mellitus without complication, without long-term current use of insulin ([place])  6. Family history of colon cancer

## 2022-06-12 ENCOUNTER — Other Ambulatory Visit (HOSPITAL_BASED_OUTPATIENT_CLINIC_OR_DEPARTMENT_OTHER): Payer: Self-pay | Admitting: Obstetrics & Gynecology

## 2022-06-17 ENCOUNTER — Other Ambulatory Visit: Payer: Self-pay

## 2022-06-23 ENCOUNTER — Encounter: Payer: Self-pay | Admitting: *Deleted

## 2022-07-09 ENCOUNTER — Encounter (HOSPITAL_BASED_OUTPATIENT_CLINIC_OR_DEPARTMENT_OTHER): Payer: Self-pay | Admitting: Obstetrics & Gynecology

## 2022-07-09 ENCOUNTER — Ambulatory Visit (HOSPITAL_BASED_OUTPATIENT_CLINIC_OR_DEPARTMENT_OTHER): Payer: Medicare Other | Admitting: Obstetrics & Gynecology

## 2022-07-09 ENCOUNTER — Ambulatory Visit (INDEPENDENT_AMBULATORY_CARE_PROVIDER_SITE_OTHER): Payer: Medicare Other

## 2022-07-09 VITALS — BP 122/67 | HR 92 | Ht 66.5 in | Wt 229.4 lb

## 2022-07-09 DIAGNOSIS — Z8041 Family history of malignant neoplasm of ovary: Secondary | ICD-10-CM | POA: Diagnosis not present

## 2022-07-09 DIAGNOSIS — D259 Leiomyoma of uterus, unspecified: Secondary | ICD-10-CM | POA: Diagnosis not present

## 2022-07-09 DIAGNOSIS — R9389 Abnormal findings on diagnostic imaging of other specified body structures: Secondary | ICD-10-CM | POA: Diagnosis not present

## 2022-07-09 DIAGNOSIS — D251 Intramural leiomyoma of uterus: Secondary | ICD-10-CM | POA: Diagnosis not present

## 2022-07-15 NOTE — Progress Notes (Signed)
GYNECOLOGY  VISIT  CC:   follow up after ultrasound  HPI: 72 y.o. G3P0012 Married White or Caucasian female here for discussion of ultrasound results.  Pt has hx of uterine fibroids and family hx of ovarian cancer.  Denies vaginal bleeding or new pelvic pain issues.  Ultrasound did not show any discrete fibroids today.  However the endometrium is 5.18m with cystic area and one small area of blood flow present c/w endometrial polyp.  Recommendation of removal discussed. She is not having any bleeding.  Desires to monitor this.  Discussed with pt watching to see if enlarged/endometrium becomes more thickened.  She is comfortable with this.  Will plan to repeat ultrasound in about 6 months.  If becomes more thickened, will plan to proceed with hysteroscopy.   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   Diabetes mellitus without complication ([facility]    Family history of breast cancer    Family history of colon cancer    Family history of ovarian cancer    Fracture of knee region 10/2014   Bilateral    Gastric polyp    Gastroparesis    GERD (gastroesophageal reflux disease)    Hepatic steatosis 05/09/2019   By ultrasound 04/2019   Hiatal hernia    History of kidney stones    Hypertension    Hypothyroidism    Irritable bowel syndrome    Lactose intolerance    RSD (reflex sympathetic dystrophy)     MEDS:   Current Outpatient Medications on File Prior to Visit  Medication Sig Dispense Refill   Calcium-Magnesium-Vitamin D (CALCIUM MAGNESIUM PO) Take 1,500 mg by mouth daily.      Cholecalciferol (VITAMIN D-3) 5000 units TABS Take 5,000 Units by mouth daily.     cyanocobalamin (,VITAMIN B-12,) 1000 MCG/ML injection INJECT 1 ML INTRAMUSCULARLY ONCE EVERY MONTH 3 mL 0   cyclobenzaprine (FLEXERIL) 5 MG tablet Take 1 tablet (5 mg total) by mouth 3  (three) times daily as needed for muscle spasms. 30 tablet 0   diclofenac Sodium (VOLTAREN) 1 % GEL Apply 2 g topically 4 (four) times daily as needed (pain).     Eszopiclone 3 MG TABS Take 3 mg by mouth at bedtime. Take immediately before bedtime     fluticasone (FLONASE) 50 MCG/ACT nasal spray PLACE TWO SPRAYS INTO THE NOSE DAILY 16 g 5   levothyroxine (SYNTHROID) 50 MCG tablet TAKE 1 TABLET DAILY T1T D 90 tablet 0   lisinopril-hydrochlorothiazide (ZESTORETIC) 20-25 MG tablet TAKE 1 TABLET DAILY 90 tablet 3   oxyCODONE (ROXICODONE) 15 MG immediate release tablet Take 15 mg by mouth 4 (four) times daily as needed for pain.     pantoprazole (PROTONIX) 40 MG tablet TAKE 1 TABLET DAILY 90 tablet 3   promethazine (PHENERGAN) 25 MG suppository Place 1 suppository (25 mg total) rectally every 6 (six) hours as needed for nausea or vomiting. 12 each 2   SYRINGE-NEEDLE, DISP, 3 ML (B-D 3CC LUER-LOK SYR 25GX1") 25G X 1" 3 ML MISC USE TO INJECT VITAMIN B12 EVERY 30 DAYS 12 each 2   triamcinolone cream (KENALOG) 0.5 % Apply 1 application topically 3 (three) times daily as needed (eczema).     No current facility-administered medications on file prior to visit.    ALLERGIES: Morphine and related  and Sulfa antibiotics  SH:  married, non smoker  Review of Systems  Constitutional: Negative.   Genitourinary: Negative.     PHYSICAL EXAMINATION:    BP 122/67 (BP Location: Right Arm, Patient Position: Sitting, Cuff Size: Large)   Pulse 92   Ht 5' 6.5" (1.689 m) Comment: Reported  Wt 229 lb 6.4 oz (104.1 kg)   LMP 05/05/1996   BMI 36.47 kg/m     General appearance: alert, cooperative and appears stated age   Assessment/Plan: 1. Uterine leiomyoma, unspecified location - no discrete fibroids seen today on ultrasound  2. Family history of ovarian cancer - ovaries normal  3. Thickened endometrium - will repeat ultrasound in ~6 months.

## 2022-07-20 ENCOUNTER — Other Ambulatory Visit (HOSPITAL_BASED_OUTPATIENT_CLINIC_OR_DEPARTMENT_OTHER): Payer: Self-pay | Admitting: Obstetrics & Gynecology

## 2022-07-21 MED ORDER — LEVOTHYROXINE SODIUM 50 MCG PO TABS: 50.0000 ug | ORAL_TABLET | Freq: Every day | ORAL | 0 refills | Status: DC

## 2022-10-15 ENCOUNTER — Other Ambulatory Visit: Payer: Self-pay | Admitting: Family Medicine

## 2022-10-15 DIAGNOSIS — I10 Essential (primary) hypertension: Secondary | ICD-10-CM

## 2022-11-12 ENCOUNTER — Other Ambulatory Visit (HOSPITAL_BASED_OUTPATIENT_CLINIC_OR_DEPARTMENT_OTHER): Payer: Self-pay | Admitting: Obstetrics & Gynecology

## 2022-11-12 DIAGNOSIS — R9389 Abnormal findings on diagnostic imaging of other specified body structures: Secondary | ICD-10-CM

## 2022-11-17 ENCOUNTER — Telehealth (HOSPITAL_BASED_OUTPATIENT_CLINIC_OR_DEPARTMENT_OTHER): Payer: Self-pay | Admitting: *Deleted

## 2022-11-17 NOTE — Telephone Encounter (Signed)
 LMOVM for pt to call office to set up appt for ultrasound

## 2022-12-19 ENCOUNTER — Other Ambulatory Visit: Payer: Self-pay | Admitting: Obstetrics & Gynecology

## 2022-12-19 DIAGNOSIS — Z1231 Encounter for screening mammogram for malignant neoplasm of breast: Secondary | ICD-10-CM

## 2023-01-01 ENCOUNTER — Ambulatory Visit: Payer: Medicare Other

## 2023-01-07 ENCOUNTER — Ambulatory Visit (HOSPITAL_BASED_OUTPATIENT_CLINIC_OR_DEPARTMENT_OTHER): Payer: Medicare Other | Admitting: Obstetrics & Gynecology

## 2023-01-07 ENCOUNTER — Ambulatory Visit (HOSPITAL_BASED_OUTPATIENT_CLINIC_OR_DEPARTMENT_OTHER): Payer: Medicare Other

## 2023-01-07 ENCOUNTER — Encounter (HOSPITAL_BASED_OUTPATIENT_CLINIC_OR_DEPARTMENT_OTHER): Payer: Self-pay | Admitting: Obstetrics & Gynecology

## 2023-01-07 VITALS — BP 120/68 | HR 77 | Ht 66.5 in | Wt 232.0 lb

## 2023-01-07 DIAGNOSIS — R9389 Abnormal findings on diagnostic imaging of other specified body structures: Secondary | ICD-10-CM | POA: Diagnosis not present

## 2023-01-07 DIAGNOSIS — R935 Abnormal findings on diagnostic imaging of other abdominal regions, including retroperitoneum: Secondary | ICD-10-CM | POA: Diagnosis not present

## 2023-01-13 ENCOUNTER — Ambulatory Visit
Admission: RE | Admit: 2023-01-13 | Discharge: 2023-01-13 | Disposition: A | Discharge status: Home / Self Care | Payer: Medicare Other | Source: Ambulatory Visit | Attending: Obstetrics & Gynecology | Admitting: Obstetrics & Gynecology

## 2023-01-13 DIAGNOSIS — Z1231 Encounter for screening mammogram for malignant neoplasm of breast: Secondary | ICD-10-CM

## 2023-01-14 ENCOUNTER — Encounter (HOSPITAL_BASED_OUTPATIENT_CLINIC_OR_DEPARTMENT_OTHER): Payer: Self-pay | Admitting: Obstetrics & Gynecology

## 2023-01-15 ENCOUNTER — Other Ambulatory Visit (HOSPITAL_BASED_OUTPATIENT_CLINIC_OR_DEPARTMENT_OTHER): Payer: Self-pay | Admitting: Obstetrics & Gynecology

## 2023-01-15 DIAGNOSIS — Z9189 Other specified personal risk factors, not elsewhere classified: Secondary | ICD-10-CM

## 2023-01-16 NOTE — Progress Notes (Signed)
GYNECOLOGY  VISIT  CC:   discuss ultrasound findings  HPI: 72 y.o. G3P0012 Married White or Caucasian female here for follow up ultrasound from 07/2022 imaging. Probable endometrial polyp was noted at that time.  She decided to follow conservatively and repeat ultrasound recommended to see if this was enlarging.  She has not had any bleeding.  Ultrasound showed normal sized uterus.  Ovaries small.  Endometrium 8.25mm and this is changed from prior images where it was 5.28mm.  Cystic spaces present c/w polyp.  Removal with hysteroscopy, D&C recommended.  She has considered hysterectomy in the past and wants to think about this again.  I feel this is likely more surgery than is really required with added risks.  Reviewed this today.  Pt asks about repeat ultrasound.  At this point, with changes in endometrium, we at least need pathology to prove benign process.  Pt will consider.   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   Diabetes mellitus without complication [facility])    Family history of breast cancer    Family history of colon cancer    Family history of ovarian cancer    Fracture of knee region 10/2014   Bilateral    Gastric polyp    Gastroparesis    GERD (gastroesophageal reflux disease)    Hepatic steatosis 05/09/2019   By ultrasound 04/2019   Hiatal hernia    History of kidney stones    Hypertension    Hypothyroidism    Irritable bowel syndrome    Lactose intolerance    RSD (reflex sympathetic dystrophy)     MEDS:   Current Outpatient Medications on File Prior to Visit  Medication Sig Dispense Refill   Calcium-Magnesium-Vitamin D (CALCIUM MAGNESIUM PO) Take 1,500 mg by mouth daily.      Cholecalciferol (VITAMIN D-3) 5000 units TABS Take 5,000 Units by mouth daily.     cyanocobalamin (,VITAMIN B-12,) 1000 MCG/ML injection INJECT  1 ML INTRAMUSCULARLY ONCE EVERY MONTH 3 mL 0   cyclobenzaprine (FLEXERIL) 5 MG tablet Take 1 tablet (5 mg total) by mouth 3 (three) times daily as needed for muscle spasms. 30 tablet 0   diclofenac Sodium (VOLTAREN) 1 % GEL Apply 2 g topically 4 (four) times daily as needed (pain).     Eszopiclone 3 MG TABS Take 3 mg by mouth at bedtime. Take immediately before bedtime     fluticasone (FLONASE) 50 MCG/ACT nasal spray PLACE TWO SPRAYS INTO THE NOSE DAILY 16 g 5   levothyroxine (SYNTHROID) 50 MCG tablet Take 1 tablet (50 mcg total) by mouth daily before breakfast. 90 tablet 0   lisinopril-hydrochlorothiazide (ZESTORETIC) 20-25 MG tablet TAKE 1 TABLET DAILY 90 tablet 3   pantoprazole (PROTONIX) 40 MG tablet TAKE 1 TABLET DAILY 90 tablet 3   promethazine (PHENERGAN) 25 MG suppository Place 1 suppository (25 mg total) rectally every 6 (six) hours as needed for nausea or vomiting. 12 each 2   SYRINGE-NEEDLE, DISP, 3 ML (B-D 3CC LUER-LOK SYR 25GX1") 25G X 1" 3 ML MISC USE TO INJECT VITAMIN B12 EVERY 30 DAYS 12 each 2   triamcinolone cream (KENALOG) 0.5 % Apply 1 application topically 3 (three) times daily as needed (eczema).     No current facility-administered medications on file prior to visit.    ALLERGIES:  Morphine and codeine and Sulfa antibiotics  SH:  married, non smoker  Review of Systems  Constitutional: Negative.   Genitourinary: Negative.     PHYSICAL EXAMINATION:    BP 120/68 (BP Location: Left Arm, Patient Position: Sitting, Cuff Size: Large)   Pulse 77   Ht 5' 6.5" (1.689 m)   Wt 232 lb (105.2 kg)   LMP 05/05/1996   BMI 36.88 kg/m     Physical Exam Constitutional:      Appearance: Normal appearance.  Neurological:     General: No focal deficit present.     Mental Status: She is alert.  Psychiatric:        Mood and Affect: Mood normal.     Assessment/Plan: 1. Abnormal ultrasound of endometrium - endometrial sampling recommended.  Feel hysteroscopy with probable  polyp resection is most appropriate procedure for pt at this time.  She will consider.   Total time with pt:  25 minutes

## 2023-01-18 ENCOUNTER — Encounter (HOSPITAL_BASED_OUTPATIENT_CLINIC_OR_DEPARTMENT_OTHER): Payer: Self-pay | Admitting: Obstetrics & Gynecology

## 2023-01-19 ENCOUNTER — Other Ambulatory Visit (HOSPITAL_BASED_OUTPATIENT_CLINIC_OR_DEPARTMENT_OTHER): Payer: Self-pay | Admitting: *Deleted

## 2023-01-19 MED ORDER — LEVOTHYROXINE SODIUM 50 MCG PO TABS: 50.0000 ug | ORAL_TABLET | Freq: Every day | ORAL | 1 refills | Status: DC

## 2023-02-13 ENCOUNTER — Other Ambulatory Visit (HOSPITAL_BASED_OUTPATIENT_CLINIC_OR_DEPARTMENT_OTHER): Payer: Self-pay | Admitting: Obstetrics & Gynecology

## 2023-02-13 ENCOUNTER — Encounter (HOSPITAL_BASED_OUTPATIENT_CLINIC_OR_DEPARTMENT_OTHER): Payer: Self-pay

## 2023-02-13 DIAGNOSIS — Z01818 Encounter for other preprocedural examination: Secondary | ICD-10-CM

## 2023-03-04 ENCOUNTER — Encounter (HOSPITAL_BASED_OUTPATIENT_CLINIC_OR_DEPARTMENT_OTHER): Payer: Self-pay | Admitting: Obstetrics & Gynecology

## 2023-03-04 NOTE — Progress Notes (Addendum)
Spoke w/ via phone for pre-op interview--- Haley Jenkins needs dos----   CBC per surgeon. EKG,CBG and ISTAT per anesthesia.     Jenkins results------ COVID test -----patient states asymptomatic no test needed Arrive at -------0630 NPO after MN NO Solid Food.  Med rec completed Medications to take morning of surgery ----- Synthroid, Protonix and Oxycodone PRN Diabetic medication ----- Patient instructed no nail polish to be worn day of surgery Patient instructed to bring photo id and insurance card day of surgery Patient aware to have Driver (ride ) / caregiver    for 24 hours after surgery - Husband Flora Lipps Patient Special Instructions ----- Pre-Op special Instructions ----- Per pt she was instructed by surgeons office to hold ASA 5 days prior to procedure. Patient verbalized understanding of instructions that were given at this phone interview. Patient denies chest pain, sob, fever, cough at the interview.

## 2023-03-09 ENCOUNTER — Encounter (HOSPITAL_BASED_OUTPATIENT_CLINIC_OR_DEPARTMENT_OTHER)
Admission: RE | Disposition: A | Discharge status: Home / Self Care | Payer: Self-pay | Source: Home / Self Care | Attending: Obstetrics & Gynecology

## 2023-03-09 ENCOUNTER — Ambulatory Visit (HOSPITAL_BASED_OUTPATIENT_CLINIC_OR_DEPARTMENT_OTHER): Payer: Medicare Other | Admitting: Anesthesiology

## 2023-03-09 ENCOUNTER — Ambulatory Visit (HOSPITAL_BASED_OUTPATIENT_CLINIC_OR_DEPARTMENT_OTHER)
Admission: RE | Admit: 2023-03-09 | Discharge: 2023-03-09 | Disposition: A | Discharge status: Home / Self Care | Payer: Medicare Other | Attending: Obstetrics & Gynecology | Admitting: Obstetrics & Gynecology

## 2023-03-09 ENCOUNTER — Other Ambulatory Visit: Payer: Self-pay

## 2023-03-09 ENCOUNTER — Encounter (HOSPITAL_BASED_OUTPATIENT_CLINIC_OR_DEPARTMENT_OTHER): Payer: Self-pay | Admitting: Obstetrics & Gynecology

## 2023-03-09 DIAGNOSIS — R9389 Abnormal findings on diagnostic imaging of other specified body structures: Secondary | ICD-10-CM | POA: Diagnosis not present

## 2023-03-09 DIAGNOSIS — N840 Polyp of corpus uteri: Secondary | ICD-10-CM | POA: Insufficient documentation

## 2023-03-09 DIAGNOSIS — E119 Type 2 diabetes mellitus without complications: Secondary | ICD-10-CM

## 2023-03-09 DIAGNOSIS — K219 Gastro-esophageal reflux disease without esophagitis: Secondary | ICD-10-CM | POA: Diagnosis not present

## 2023-03-09 DIAGNOSIS — Z6836 Body mass index (BMI) 36.0-36.9, adult: Secondary | ICD-10-CM | POA: Diagnosis not present

## 2023-03-09 DIAGNOSIS — E039 Hypothyroidism, unspecified: Secondary | ICD-10-CM | POA: Diagnosis not present

## 2023-03-09 DIAGNOSIS — E1143 Type 2 diabetes mellitus with diabetic autonomic (poly)neuropathy: Secondary | ICD-10-CM | POA: Diagnosis not present

## 2023-03-09 DIAGNOSIS — Z01818 Encounter for other preprocedural examination: Secondary | ICD-10-CM

## 2023-03-09 DIAGNOSIS — K449 Diaphragmatic hernia without obstruction or gangrene: Secondary | ICD-10-CM | POA: Insufficient documentation

## 2023-03-09 DIAGNOSIS — I10 Essential (primary) hypertension: Secondary | ICD-10-CM | POA: Insufficient documentation

## 2023-03-09 DIAGNOSIS — R935 Abnormal findings on diagnostic imaging of other abdominal regions, including retroperitoneum: Secondary | ICD-10-CM

## 2023-03-09 HISTORY — PX: HYSTEROSCOPY WITH D & C: SHX1775

## 2023-03-09 LAB — POCT I-STAT, CHEM 8
BUN: 14 mg/dL (ref 8–23)
Calcium, Ion: 1.13 mmol/L — ABNORMAL LOW (ref 1.15–1.40)
Chloride: 99 mmol/L (ref 98–111)
Creatinine, Ser: 0.9 mg/dL (ref 0.44–1.00)
Glucose, Bld: 133 mg/dL — ABNORMAL HIGH (ref 70–99)
HCT: 47 % — ABNORMAL HIGH (ref 36.0–46.0)
Hemoglobin: 16 g/dL — ABNORMAL HIGH (ref 12.0–15.0)
Potassium: 3.2 mmol/L — ABNORMAL LOW (ref 3.5–5.1)
Sodium: 138 mmol/L (ref 135–145)
TCO2: 26 mmol/L (ref 22–32)

## 2023-03-09 LAB — GLUCOSE, CAPILLARY: Glucose-Capillary: 143 mg/dL — ABNORMAL HIGH (ref 70–99)

## 2023-03-09 SURGERY — DILATATION AND CURETTAGE /HYSTEROSCOPY
Anesthesia: General | Site: Vagina

## 2023-03-09 MED ORDER — LACTATED RINGERS IV SOLN: INTRAVENOUS | Status: DC

## 2023-03-09 MED ORDER — PROPOFOL 10 MG/ML IV BOLUS
INTRAVENOUS | Status: AC
  Filled 2023-03-09: qty 20

## 2023-03-09 MED ORDER — LIDOCAINE-EPINEPHRINE 1 %-1:100000 IJ SOLN
INTRAMUSCULAR | Status: DC | PRN
  Administered 2023-03-09: 10 mL

## 2023-03-09 MED ORDER — PHENYLEPHRINE 80 MCG/ML (10ML) SYRINGE FOR IV PUSH (FOR BLOOD PRESSURE SUPPORT)
PREFILLED_SYRINGE | INTRAVENOUS | Status: AC
  Filled 2023-03-09: qty 10

## 2023-03-09 MED ORDER — PHENYLEPHRINE 80 MCG/ML (10ML) SYRINGE FOR IV PUSH (FOR BLOOD PRESSURE SUPPORT)
PREFILLED_SYRINGE | INTRAVENOUS | Status: DC | PRN
  Administered 2023-03-09 (×2): 80 ug via INTRAVENOUS

## 2023-03-09 MED ORDER — POVIDONE-IODINE 10 % EX SWAB: 2.0000 | Freq: Once | CUTANEOUS | Status: DC

## 2023-03-09 MED ORDER — IBUPROFEN 800 MG PO TABS: 800.0000 mg | ORAL_TABLET | Freq: Three times a day (TID) | ORAL | 0 refills | Status: DC | PRN

## 2023-03-09 MED ORDER — DEXAMETHASONE SODIUM PHOSPHATE 10 MG/ML IJ SOLN
INTRAMUSCULAR | Status: DC | PRN
  Administered 2023-03-09: 5 mg via INTRAVENOUS

## 2023-03-09 MED ORDER — DEXAMETHASONE SODIUM PHOSPHATE 10 MG/ML IJ SOLN
INTRAMUSCULAR | Status: AC
  Filled 2023-03-09: qty 1

## 2023-03-09 MED ORDER — OXYCODONE HCL 5 MG/5ML PO SOLN: 5.0000 mg | Freq: Once | ORAL | Status: DC | PRN

## 2023-03-09 MED ORDER — SUCCINYLCHOLINE CHLORIDE 200 MG/10ML IV SOSY
PREFILLED_SYRINGE | INTRAVENOUS | Status: DC | PRN
  Administered 2023-03-09: 120 mg via INTRAVENOUS

## 2023-03-09 MED ORDER — LIDOCAINE 2% (20 MG/ML) 5 ML SYRINGE
INTRAMUSCULAR | Status: DC | PRN
  Administered 2023-03-09: 100 mg via INTRAVENOUS

## 2023-03-09 MED ORDER — ACETAMINOPHEN 10 MG/ML IV SOLN: 1000.0000 mg | Freq: Once | INTRAVENOUS | Status: DC | PRN

## 2023-03-09 MED ORDER — HYDROCODONE-ACETAMINOPHEN 5-325 MG PO TABS: 1.0000 | ORAL_TABLET | Freq: Four times a day (QID) | ORAL | 0 refills | Status: DC | PRN

## 2023-03-09 MED ORDER — PROPOFOL 10 MG/ML IV BOLUS
INTRAVENOUS | Status: DC | PRN
  Administered 2023-03-09: 140 mg via INTRAVENOUS

## 2023-03-09 MED ORDER — ONDANSETRON HCL 4 MG/2ML IJ SOLN: 4.0000 mg | Freq: Once | INTRAMUSCULAR | Status: DC | PRN

## 2023-03-09 MED ORDER — LIDOCAINE HCL (PF) 2 % IJ SOLN
INTRAMUSCULAR | Status: AC
  Filled 2023-03-09: qty 5

## 2023-03-09 MED ORDER — FENTANYL CITRATE (PF) 100 MCG/2ML IJ SOLN
INTRAMUSCULAR | Status: DC | PRN
  Administered 2023-03-09 (×2): 50 ug via INTRAVENOUS

## 2023-03-09 MED ORDER — OXYCODONE HCL 5 MG PO TABS: 5.0000 mg | ORAL_TABLET | Freq: Once | ORAL | Status: DC | PRN

## 2023-03-09 MED ORDER — FENTANYL CITRATE (PF) 100 MCG/2ML IJ SOLN
INTRAMUSCULAR | Status: AC
  Filled 2023-03-09: qty 2

## 2023-03-09 MED ORDER — ONDANSETRON HCL 4 MG/2ML IJ SOLN
INTRAMUSCULAR | Status: AC
  Filled 2023-03-09: qty 2

## 2023-03-09 MED ORDER — FENTANYL CITRATE (PF) 100 MCG/2ML IJ SOLN: 25.0000 ug | INTRAMUSCULAR | Status: DC | PRN

## 2023-03-09 MED ORDER — ONDANSETRON HCL 4 MG/2ML IJ SOLN
INTRAMUSCULAR | Status: DC | PRN
  Administered 2023-03-09: 4 mg via INTRAVENOUS

## 2023-03-09 MED ORDER — SODIUM CHLORIDE 0.9 % IR SOLN
Status: DC | PRN
  Administered 2023-03-09: 1000 mL

## 2023-03-09 SURGICAL SUPPLY — 17 items
CATH ROBINSON RED A/P 16FR (CATHETERS) ×1 IMPLANT
DEVICE MYOSURE LITE (MISCELLANEOUS) IMPLANT
DEVICE MYOSURE REACH (MISCELLANEOUS) IMPLANT
DILATOR CANAL MILEX (MISCELLANEOUS) IMPLANT
GLOVE BIOGEL PI IND STRL 7.0 (GLOVE) ×1 IMPLANT
GLOVE ECLIPSE 6.5 STRL STRAW (GLOVE) ×1 IMPLANT
GOWN STRL REUS W/TWL LRG LVL3 (GOWN DISPOSABLE) ×1 IMPLANT
IV NS IRRIG 3000ML ARTHROMATIC (IV SOLUTION) ×1 IMPLANT
KIT PROCEDURE FLUENT (KITS) ×1 IMPLANT
KIT TURNOVER CYSTO (KITS) ×1 IMPLANT
PACK VAGINAL MINOR WOMEN LF (CUSTOM PROCEDURE TRAY) ×1 IMPLANT
PAD OB MATERNITY 4.3X12.25 (PERSONAL CARE ITEMS) ×1 IMPLANT
SEAL CERVICAL OMNI LOK (ABLATOR) IMPLANT
SEAL ROD LENS SCOPE MYOSURE (ABLATOR) ×1 IMPLANT
SLEEVE SCD COMPRESS KNEE MED (STOCKING) ×1 IMPLANT
TOWEL OR 17X24 6PK STRL BLUE (TOWEL DISPOSABLE) ×1 IMPLANT
WATER STERILE IRR 500ML POUR (IV SOLUTION) ×1 IMPLANT

## 2023-03-09 NOTE — Transfer of Care (Signed)
Immediate Anesthesia Transfer of Care Note  Patient: Haley Jenkins  Procedure(s) Performed: DILATATION AND CURETTAGE /HYSTEROSCOPY (Vagina )  Patient Location: PACU  Anesthesia Type:General  Level of Consciousness: awake, alert , oriented, and patient cooperative  Airway & Oxygen Therapy: Patient Spontanous Breathing  Post-op Assessment: Report given to RN and Post -op Vital signs reviewed and stable  Post vital signs: Reviewed and stable  Last Vitals:  Vitals Value Taken Time  BP 104/75 03/09/23 1015  Temp    Pulse 94 03/09/23 1016  Resp 15 03/09/23 1016  SpO2 94 % 03/09/23 1016  Vitals shown include unfiled device data.  Last Pain:  Vitals:   03/09/23 0703  TempSrc: Oral         Complications: No notable events documented.

## 2023-03-09 NOTE — H&P (Signed)
Haley Jenkins is an 72 y.o. female [id number] MWF here for hysteroscopy, polyp resection, D&C due to abnormal findings on ultrasound that have persisted.  This appears to be a polyp and although she is not having any bleeding, endometrium is almost 9mm with cystic spaces so additional evaluation has been recommended.  Procedure discussed with patient.  Recovery and pain management discussed.  Risks discussed including but not limited to bleeding, rare risk of transfusion, infection, 1% risk of uterine perforation with risks of fluid deficit causing cardiac arrythmia, cerebral swelling and/or need to stop procedure early.  Fluid emboli and rare risk of death discussed.  DVT/PE, rare risk of risk of bowel/bladder/ureteral/vascular injury.  Patient aware if pathology abnormal she may need additional treatment.  All questions answered.    Pertinent Gynecological History: Menses: post-menopausal Bleeding: none Contraception: post menopausal status DES exposure: denies Blood transfusions: none Sexually transmitted diseases: no past history Previous GYN Procedures:  mid urethal sling placed in 2013   Last mammogram: normal Date: 12/13/2021 Last pap: normal Date: ASCUS with neg HR HPV 05/27/2021 OB History: G3, P2   Menstrual History: Patient's last menstrual period was 05/05/1996.    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   Diabetes mellitus without complication ([facility])    Family history of breast cancer    Family history of colon cancer    Family history of ovarian cancer    Fracture of knee region 10/2014   Bilateral    Gastric polyp    Gastroparesis    GERD (gastroesophageal reflux disease)    Hepatic steatosis 05/09/2019   By ultrasound 04/2019   Hiatal hernia    History of kidney stones    Hypertension    Hypothyroidism    Irritable  bowel syndrome    Lactose intolerance    RSD (reflex sympathetic dystrophy)     Past Surgical History:  Procedure Laterality Date   BREAST BIOPSY Left    BREAST EXCISIONAL BIOPSY Right    breast surgery cluster cyst removed     x 2   CARPAL TUNNEL RELEASE Left 2019   CATARACT EXTRACTION W/ INTRAOCULAR LENS IMPLANT  02/05/2021   right on 10/4 and left on 10/11   cleft palate repaired  birth, 1984   at [facility]   COLONOSCOPY     GANGLION CYST EXCISION Right 11/04/2016   INCISIONAL BREAST BIOPSY  1997, 2003   KNEE ARTHROSCOPY Bilateral 11/2014   [facility]   KNEE SURGERY Left 1976   thumb surgery  05/2021   TOTAL KNEE ARTHROPLASTY Right 01/11/2021   Procedure: TOTAL KNEE ARTHROPLASTY;  Surgeon: Beverely Low, MD;  Location: [facility];  Service: Orthopedics;  Laterality: Right;  with block   TUBAL LIGATION  1987   URETHRAL SLING  03/2012   mid-urethral sling   Bladder    Family History  Problem Relation Age of Onset   Kidney disease Mother    Osteoporosis Mother    Heart attack Mother        pacemaker   Other Mother 98       meningioma-removed on 05/26/13   Ovarian cancer Mother 49   Brain cancer Mother 49       meningioma   COPD Father    Lung cancer Father 40  Breast cancer Sister 30       40 at diagnosis.  Recurrence 2017 at 72 yo.   Irritable bowel syndrome Daughter    Heart disease Maternal Grandfather    Breast cancer Other        MGM's two sisters   Leukemia Maternal Grandmother    Breast cancer Maternal Aunt 1   Brain cancer Maternal Uncle    Colon cancer Paternal Aunt    Stroke Paternal Grandfather    Colon cancer Sister 84   Breast cancer Maternal Aunt    Colon cancer Maternal Aunt    Lymphoma Maternal Aunt    Breast cancer Maternal Aunt    Brain cancer Maternal Aunt    Lung cancer Maternal Uncle        smoker   Colon cancer Maternal Uncle    Breast cancer Other        MGM's mother   Breast cancer Cousin 72       maternal first cousin    Esophageal cancer Neg Hx    Rectal cancer Neg Hx    Stomach cancer Neg Hx     Social History:  reports that she has never smoked. She has never used smokeless tobacco. She reports that she does not drink alcohol and does not use drugs.  Allergies:  Allergies  Allergen Reactions   Morphine And Codeine Rash and Other (See Comments)    HallucinationS   Sulfa Antibiotics Nausea And Vomiting and Nausea Only    Medications Prior to Admission  Medication Sig Dispense Refill Last Dose   aspirin EC 81 MG tablet Take 81 mg by mouth daily. Swallow whole.   Past Week   Calcium-Magnesium-Vitamin D (CALCIUM MAGNESIUM PO) Take 1,500 mg by mouth daily.    Past Week   Cholecalciferol (VITAMIN D-3) 5000 units TABS Take 5,000 Units by mouth daily.   03/08/2023   cyanocobalamin (,VITAMIN B-12,) 1000 MCG/ML injection INJECT 1 ML INTRAMUSCULARLY ONCE EVERY MONTH 3 mL 0 Past Month   cyclobenzaprine (FLEXERIL) 5 MG tablet Take 1 tablet (5 mg total) by mouth 3 (three) times daily as needed for muscle spasms. 30 tablet 0 03/08/2023   diclofenac Sodium (VOLTAREN) 1 % GEL Apply 2 g topically 4 (four) times daily as needed (pain).   Past Week   Eszopiclone 3 MG TABS Take 3 mg by mouth at bedtime. Take immediately before bedtime   03/08/2023   levothyroxine (SYNTHROID) 50 MCG tablet Take 1 tablet (50 mcg total) by mouth daily before breakfast. 90 tablet 1 03/09/2023 at 0530   lisinopril-hydrochlorothiazide (ZESTORETIC) 20-25 MG tablet TAKE 1 TABLET DAILY 90 tablet 3 03/08/2023   oxycodone (OXY-IR) 5 MG capsule Take 10 mg by mouth every 4 (four) hours as needed for pain.   03/09/2023 at 0530   pantoprazole (PROTONIX) 40 MG tablet TAKE 1 TABLET DAILY 90 tablet 3 03/09/2023 at 0530   triamcinolone cream (KENALOG) 0.5 % Apply 1 application topically 3 (three) times daily as needed (eczema).   Past Week   fluticasone (FLONASE) 50 MCG/ACT nasal spray PLACE TWO SPRAYS INTO THE NOSE DAILY 16 g 5 Unknown   promethazine (PHENERGAN)  25 MG suppository Place 1 suppository (25 mg total) rectally every 6 (six) hours as needed for nausea or vomiting. 12 each 2 Unknown   SYRINGE-NEEDLE, DISP, 3 ML (B-D 3CC LUER-LOK SYR 25GX1") 25G X 1" 3 ML MISC USE TO INJECT VITAMIN B12 EVERY 30 DAYS 12 each 2     Review of Systems  Constitutional: Negative.   Genitourinary: Negative.     Blood pressure (!) 141/84, pulse 94, temperature 97.9 F (36.6 C), temperature source Oral, resp. rate 17, height 5' 6.5" (1.689 m), weight 103.5 kg, last menstrual period 05/05/1996, SpO2 99%. Physical Exam Constitutional:      Appearance: Normal appearance.  Cardiovascular:     Rate and Rhythm: Normal rate and regular rhythm.  Pulmonary:     Effort: Pulmonary effort is normal.     Breath sounds: Normal breath sounds.  Neurological:     General: No focal deficit present.     Mental Status: She is alert.  Psychiatric:        Mood and Affect: Mood normal.        Behavior: Behavior normal.     No results found for this or any previous visit (from the past 24 hour(s)).  No results found.  Assessment/Plan: 72 yo G3P2 MWF with abnormal endometrium on ultrasound, thickened to 9mm with cystic spaces, here for additional evaluation and treatment with hysteroscopy, probable polyp resection, D&C.  Questions answered.  Pt ready to proceed.  Jerene Bears 03/09/2023, 7:17 AM

## 2023-03-09 NOTE — Anesthesia Preprocedure Evaluation (Addendum)
Anesthesia Evaluation  Patient identified by MRN, date of birth, ID band Patient awake    Reviewed: Allergy & Precautions, H&P , NPO status , Patient's Chart, lab work & pertinent test results  Airway Mallampati: II  TM Distance: >3 FB Neck ROM: Full    Dental no notable dental hx.    Pulmonary neg pulmonary ROS   Pulmonary exam normal breath sounds clear to auscultation       Cardiovascular hypertension, Normal cardiovascular exam Rhythm:Regular Rate:Normal     Neuro/Psych negative neurological ROS  negative psych ROS   GI/Hepatic Neg liver ROS, hiatal hernia,GERD  ,,  Endo/Other  diabetes, Type 2Hypothyroidism  Morbid obesity  Renal/GU negative Renal ROS  negative genitourinary   Musculoskeletal negative musculoskeletal ROS (+)    Abdominal   Peds negative pediatric ROS (+)  Hematology negative hematology ROS (+)   Anesthesia Other Findings   Reproductive/Obstetrics negative OB ROS                             Anesthesia Physical Anesthesia Plan  ASA: 3  Anesthesia Plan: General   Post-op Pain Management: Minimal or no pain anticipated   Induction: Intravenous and Rapid sequence  PONV Risk Score and Plan: 3 and Ondansetron, Dexamethasone and Treatment may vary due to age or medical condition  Airway Management Planned: Oral ETT  Additional Equipment:   Intra-op Plan:   Post-operative Plan: Extubation in OR  Informed Consent: I have reviewed the patients History and Physical, chart, labs and discussed the procedure including the risks, benefits and alternatives for the proposed anesthesia with the patient or authorized representative who has indicated his/her understanding and acceptance.     Dental advisory given  Plan Discussed with: CRNA and Surgeon  Anesthesia Plan Comments:        Anesthesia Quick Evaluation

## 2023-03-09 NOTE — Discharge Instructions (Addendum)
Post-surgical Instructions, Outpatient Surgery  You may expect to feel dizzy, weak, and drowsy for as long as 24 hours after receiving the medicine that made you sleep (anesthetic). For the first 24 hours after your surgery:   Do not drive a car, ride a bicycle, participate in physical activities, or take public transportation until you are done taking narcotic pain medicines or as directed by Dr. Hyacinth Meeker.  Do not drink alcohol or take tranquilizers.  Do not take medicine that has not been prescribed by your physicians.  Do not sign important papers or make important decisions while on narcotic pain medicines.  Have a responsible person with you.   PAIN MANAGEMENT Motrin 800mg .  (This is the same as 4-200mg  over the counter tablets of Motrin or ibuprofen.)  You may take this every eight hours or as needed for cramping.  Do not take your diclofenac if you take the prescription ibuprofen Vicodin 5/325mg .  For more severe pain, take one or two tablets every four to six hours as needed for pain control.  (Remember that narcotic pain medications increase your risk of constipation.  If this becomes a problem, you may take an over the counter stool softener like Colace 100mg  up to four times a day.)  So not take your oxycodone if you feel you need the hydrocodone/acetaminophen for post op cramping.    DO'S AND DON'T'S Do not take a tub bath for one week.  You may shower on the first day after your surgery Do not do any heavy lifting for one to two weeks.  This increases the chance of bleeding. Do move around as you feel able.  Stairs are fine.  You may begin to exercise again as you feel able.  Do not lift any weights for two weeks. Do not put anything in the vagina for two weeks--no tampons, intercourse, or douching.    REGULAR MEDIATIONS/VITAMINS: You may restart all of your regular medications as prescribed. You may restart all of your vitamins as you normally take them.    PLEASE CALL OR SEEK  MEDICAL CARE IF: You have persistent nausea and vomiting.  You have trouble eating or drinking.  You have an oral temperature above 100.5.  You have constipation that is not helped by adjusting diet or increasing fluid intake. Pain medicines are a common cause of constipation.  You have heavy vaginal bleeding    Post Anesthesia Home Care Instructions  Activity: Get plenty of rest for the remainder of the day. A responsible individual must stay with you for 24 hours following the procedure.  For the next 24 hours, DO NOT: -Drive a car -Advertising copywriter -Drink alcoholic beverages -Take any medication unless instructed by your physician -Make any legal decisions or sign important papers.  Meals: Start with liquid foods such as gelatin or soup. Progress to regular foods as tolerated. Avoid greasy, spicy, heavy foods. If nausea and/or vomiting occur, drink only clear liquids until the nausea and/or vomiting subsides. Call your physician if vomiting continues.  Special Instructions/Symptoms: Your throat may feel dry or sore from the anesthesia or the breathing tube placed in your throat during surgery. If this causes discomfort, gargle with warm salt water. The discomfort should disappear within 24 hours.

## 2023-03-09 NOTE — Anesthesia Postprocedure Evaluation (Signed)
Anesthesia Post Note  Patient: Haley Jenkins  Procedure(s) Performed: DILATATION AND CURETTAGE /HYSTEROSCOPY (Vagina )     Patient location during evaluation: PACU Anesthesia Type: General Level of consciousness: awake and alert Pain management: pain level controlled Vital Signs Assessment: post-procedure vital signs reviewed and stable Respiratory status: spontaneous breathing, nonlabored ventilation, respiratory function stable and patient connected to nasal cannula oxygen Cardiovascular status: blood pressure returned to baseline and stable Postop Assessment: no apparent nausea or vomiting Anesthetic complications: no  No notable events documented.  Last Vitals:  Vitals:   03/09/23 1014 03/09/23 1015  BP: 90/61 104/75  Pulse: 93 93  Resp: 14 12  Temp: 36.7 C   SpO2: 100% 97%    Last Pain:  Vitals:   03/09/23 0703  TempSrc: Oral                 Mida Cory S

## 2023-03-09 NOTE — Anesthesia Procedure Notes (Signed)
Procedure Name: Intubation Date/Time: 03/09/2023 9:30 AM  Performed by: Bishop Limbo, CRNAPre-anesthesia Checklist: Patient identified, Emergency Drugs available, Suction available and Patient being monitored Patient Re-evaluated:Patient Re-evaluated prior to induction Oxygen Delivery Method: Circle System Utilized Preoxygenation: Pre-oxygenation with 100% oxygen Induction Type: IV induction and Rapid sequence Laryngoscope Size: 3 and Mac Grade View: Grade I Tube type: Oral Tube size: 7.0 mm Number of attempts: 1 Airway Equipment and Method: Stylet Placement Confirmation: ETT inserted through vocal cords under direct vision, positive ETCO2 and breath sounds checked- equal and bilateral Secured at: 22 cm Tube secured with: Tape Dental Injury: Teeth and Oropharynx as per pre-operative assessment

## 2023-03-09 NOTE — Op Note (Signed)
03/09/2023  10:07 AM  PATIENT:  Haley Jenkins  72 y.o. female  PRE-OPERATIVE DIAGNOSIS:  abnormal endometrium, polyp  POST-OPERATIVE DIAGNOSIS:  abnormal endometrium,polyp  PROCEDURE:  Procedure(s): DILATATION AND CURETTAGE /HYSTEROSCOPY WITH POLYP RESECTION  SURGEON:  Jerene Bears  ASSISTANTS: OR staff  ANESTHESIA:   general  ESTIMATED BLOOD LOSS: 5cc  BLOOD ADMINISTERED:none   FLUIDS: 200 cc LR but pt will be given 500 cc total   UOP: voided before procedure  SPECIMEN:  endometrial polyps and curettings  DISPOSITION OF SPECIMEN:  PATHOLOGY  FINDINGS: posterior flat endometrial polyp and second polyp by the left fallopian tube orifice  DESCRIPTION OF OPERATION: Patient was taken to the operating room.  She is placed in the supine position. SCDs were on her lower extremities and functioning properly. General anesthesia with an LMA was administered without difficulty. Dr. Okey Dupre, anesthesia, oversaw case.  Legs were then placed in the [facility] stirrups in the low lithotomy position. The legs were lifted to the high lithotomy position and the Betadine prep was used on the inner thighs perineum and vagina x3. Patient was draped in a normal standard fashion. Pt voided before going back to the OR so no I&O catheterization of the bladder was done.  A bivalve speculum was placed the vagina. The anterior lip of the cervix was grasped with single-tooth tenaculum.  A paracervical block of 1% lidocaine mixed one-to-one with epinephrine (1:100,000 units).  10 cc was used total. The cervix is dilated up to #[address] [facility] dilators. The endometrial cavity sounded to 7.5 cm.   A 5.87mm myosure hysteroscope was obtained. Normal saline was used as a hysteroscopic fluid. The hysteroscope was advanced through the endocervical canal into the endometrial cavity. The tubal ostia were noted bilaterally. Additional findings included two polyps as noted above.  Using the [facility] reach device, both polyps were  fully excised.  Photodocumentation was taken before and after the resection.  The hysteroscope was removed. A #1 toothed curette was used to curette the cavity until rough gritty texture was noted in all quadrants. With revisualization of the hysteroscope, there was no longer any abnormal findings.  At this point no other procedure was needed and this procedure was ended. The hysteroscope was removed. The fluid deficit was 145 cc of NS. The tenaculum was removed from the anterior lip of the cervix. The speculum was removed from the vagina. The prep was cleansed of the patient's skin. The legs are positioned back in the supine position. Sponge, lap, needle, instrument counts were correct x2. Patient was taken to recovery in stable condition.   COUNTS:  YES  PLAN OF CARE: Transfer to PACU

## 2023-03-10 LAB — SURGICAL PATHOLOGY

## 2023-03-11 ENCOUNTER — Encounter (HOSPITAL_BASED_OUTPATIENT_CLINIC_OR_DEPARTMENT_OTHER): Payer: Self-pay | Admitting: Obstetrics & Gynecology

## 2023-04-06 ENCOUNTER — Ambulatory Visit (HOSPITAL_BASED_OUTPATIENT_CLINIC_OR_DEPARTMENT_OTHER): Payer: Medicare Other | Admitting: Obstetrics & Gynecology

## 2023-04-06 ENCOUNTER — Encounter (HOSPITAL_BASED_OUTPATIENT_CLINIC_OR_DEPARTMENT_OTHER): Payer: Self-pay | Admitting: Obstetrics & Gynecology

## 2023-04-06 VITALS — BP 132/82 | HR 84 | Wt 228.2 lb

## 2023-04-06 DIAGNOSIS — N840 Polyp of corpus uteri: Secondary | ICD-10-CM

## 2023-04-06 DIAGNOSIS — N764 Abscess of vulva: Secondary | ICD-10-CM | POA: Diagnosis not present

## 2023-04-06 DIAGNOSIS — Z9889 Other specified postprocedural states: Secondary | ICD-10-CM | POA: Diagnosis not present

## 2023-04-06 NOTE — Progress Notes (Signed)
GYNECOLOGY  VISIT  CC:   post op recheck  HPI: 72 y.o. G3P0012 Married White or Caucasian female here for recheck after undergoing Hysteroscopy, polyp resection with D&C on 03/09/2023.  She reports bleeding is minimal.  She has no pain.  Bowel function is  back her baseline .  Bladder function is normal.    Pathology reviewed:  Yes .  Questions answered.    Unrelated, has a place on her vulva that she would like me to look at today.  MEDS:   Current Outpatient Medications on File Prior to Visit  Medication Sig Dispense Refill   aspirin EC 81 MG tablet Take 81 mg by mouth daily. Swallow whole.     Calcium-Magnesium-Vitamin D (CALCIUM MAGNESIUM PO) Take 1,500 mg by mouth daily.      Cholecalciferol (VITAMIN D-3) 5000 units TABS Take 5,000 Units by mouth daily.     cyanocobalamin (,VITAMIN B-12,) 1000 MCG/ML injection INJECT 1 ML INTRAMUSCULARLY ONCE EVERY MONTH 3 mL 0   cyclobenzaprine (FLEXERIL) 5 MG tablet Take 1 tablet (5 mg total) by mouth 3 (three) times daily as needed for muscle spasms. 30 tablet 0   diclofenac Sodium (VOLTAREN) 1 % GEL Apply 2 g topically 4 (four) times daily as needed (pain).     Eszopiclone 3 MG TABS Take 3 mg by mouth at bedtime. Take immediately before bedtime     fluticasone (FLONASE) 50 MCG/ACT nasal spray PLACE TWO SPRAYS INTO THE NOSE DAILY 16 g 5   HYDROcodone-acetaminophen (NORCO/VICODIN) 5-325 MG tablet Take 1 tablet by mouth every 6 (six) hours as needed for moderate pain (pain score 4-6) or severe pain (pain score 7-10). 12 tablet 0   ibuprofen (ADVIL) 800 MG tablet Take 1 tablet (800 mg total) by mouth every 8 (eight) hours as needed. 20 tablet 0   levothyroxine (SYNTHROID) 50 MCG tablet Take 1 tablet (50 mcg total) by mouth daily before breakfast. 90 tablet 1   lisinopril-hydrochlorothiazide (ZESTORETIC) 20-25 MG tablet TAKE 1 TABLET DAILY 90 tablet 3   oxycodone (OXY-IR) 5 MG capsule Take 10 mg by mouth every 4 (four) hours as needed for pain.      pantoprazole (PROTONIX) 40 MG tablet TAKE 1 TABLET DAILY 90 tablet 3   promethazine (PHENERGAN) 25 MG suppository Place 1 suppository (25 mg total) rectally every 6 (six) hours as needed for nausea or vomiting. 12 each 2   SYRINGE-NEEDLE, DISP, 3 ML (B-D 3CC LUER-LOK SYR 25GX1") 25G X 1" 3 ML MISC USE TO INJECT VITAMIN B12 EVERY 30 DAYS 12 each 2   triamcinolone cream (KENALOG) 0.5 % Apply 1 application topically 3 (three) times daily as needed (eczema).     No current facility-administered medications on file prior to visit.    SH:  Smoking No    PHYSICAL EXAMINATION:    BP 132/82   Pulse 84   Wt 228 lb 3.2 oz (103.5 kg)   LMP 05/05/1996   BMI 36.28 kg/m     General appearance: alert, cooperative and appears stated age CV:  Regular rate and rhythm  Pelvic: External genitalia: about 3cm left mons abscess with fluctuance present              Urethra:  normal appearing urethra with no masses, tenderness or lesions              Bartholins and Skenes: normal                 Vagina: normal appearing  vagina with normal color and discharge, no lesions              Cervix: no lesions  Chaperone, Raechel Ache, RN, was present for exam.  Assessment/Plan: 1. History of hysteroscopy - doing well from surgical standpoint  2. Endometrial polyp - pathology reviewed  3. Vulvar abscess - discussed with pt I&D.  She is on doxycycline 100mg  bid from dermatologist for 10 days.  She wants to wait and see how much this helps.  Will call if worsens for I&D in office.

## 2023-04-13 ENCOUNTER — Telehealth (HOSPITAL_BASED_OUTPATIENT_CLINIC_OR_DEPARTMENT_OTHER): Payer: Self-pay | Admitting: *Deleted

## 2023-04-13 NOTE — Telephone Encounter (Signed)
-----   Message from Jerene Bears sent at 04/13/2023  6:33 AM EST ----- Regarding: Breast MRI Haley Jenkins, The order for this pt's MRI is close to expiring.  I've ordered it and I'm guessing you did the precert.  It's not scheduled.  She is a pt I did a hysteroscopy on this year.  Can you look and see about this?  Thanks.  MSM

## 2023-04-13 NOTE — Telephone Encounter (Signed)
LMOVM for pt to call regarding breast MRI

## 2023-05-13 ENCOUNTER — Ambulatory Visit
Admission: RE | Admit: 2023-05-13 | Discharge: 2023-05-13 | Disposition: A | Discharge status: Home / Self Care | Payer: Medicare Other | Source: Ambulatory Visit | Attending: Obstetrics & Gynecology | Admitting: Obstetrics & Gynecology

## 2023-05-13 DIAGNOSIS — Z9189 Other specified personal risk factors, not elsewhere classified: Secondary | ICD-10-CM

## 2023-05-13 MED ORDER — GADOPICLENOL 0.5 MMOL/ML IV SOLN
10.0000 mL | Freq: Once | INTRAVENOUS | Status: AC | PRN
  Administered 2023-05-13: 10 mL via INTRAVENOUS

## 2023-06-04 ENCOUNTER — Ambulatory Visit (HOSPITAL_BASED_OUTPATIENT_CLINIC_OR_DEPARTMENT_OTHER): Payer: Medicare Other | Admitting: Obstetrics & Gynecology

## 2023-07-20 ENCOUNTER — Other Ambulatory Visit (HOSPITAL_BASED_OUTPATIENT_CLINIC_OR_DEPARTMENT_OTHER): Payer: Self-pay | Admitting: Obstetrics & Gynecology

## 2023-08-04 ENCOUNTER — Encounter (HOSPITAL_BASED_OUTPATIENT_CLINIC_OR_DEPARTMENT_OTHER): Payer: Self-pay | Admitting: Obstetrics & Gynecology

## 2023-08-04 ENCOUNTER — Other Ambulatory Visit (HOSPITAL_COMMUNITY)
Admission: RE | Admit: 2023-08-04 | Discharge: 2023-08-04 | Disposition: A | Discharge status: Home / Self Care | Source: Ambulatory Visit | Attending: Obstetrics & Gynecology | Admitting: Obstetrics & Gynecology

## 2023-08-04 ENCOUNTER — Ambulatory Visit (HOSPITAL_BASED_OUTPATIENT_CLINIC_OR_DEPARTMENT_OTHER): Payer: Medicare Other | Admitting: Obstetrics & Gynecology

## 2023-08-04 VITALS — BP 93/73 | HR 77 | Ht 66.5 in | Wt 227.4 lb

## 2023-08-04 DIAGNOSIS — Z01419 Encounter for gynecological examination (general) (routine) without abnormal findings: Secondary | ICD-10-CM

## 2023-08-04 DIAGNOSIS — Z1151 Encounter for screening for human papillomavirus (HPV): Secondary | ICD-10-CM | POA: Diagnosis not present

## 2023-08-04 DIAGNOSIS — Z8041 Family history of malignant neoplasm of ovary: Secondary | ICD-10-CM

## 2023-08-04 DIAGNOSIS — E119 Type 2 diabetes mellitus without complications: Secondary | ICD-10-CM

## 2023-08-04 DIAGNOSIS — Z803 Family history of malignant neoplasm of breast: Secondary | ICD-10-CM | POA: Diagnosis not present

## 2023-08-04 DIAGNOSIS — Z124 Encounter for screening for malignant neoplasm of cervix: Secondary | ICD-10-CM

## 2023-08-04 NOTE — Progress Notes (Signed)
 Breast and Pelvic Exam Patient name: Haley Jenkins MRN [id number]  Date of birth: [birth year] Chief Complaint:   Breast and pelvic Exam  History of Present Illness:   Haley Jenkins is a 73 y.o. G31P0012 Caucasian female being seen today for a breast and pelvic exam.  Denies vaginal bleeding.  She has recently started [place].  Has follow up in a few weeks.  Not having any vulvar itching or discharge.    Patient's last menstrual period was 05/05/1996.   Last pap 05/27/2021. Results were:  normal .  Last mammogram:01/13/2023 and breast MRI 05/2023. Results were: normal. Family h/o breast cancer: yes, sister Last colonoscopy: 02/16/2020. Results were: normal. Family h/o colorectal cancer: no Dexa:  normal 2023     04/06/2023    3:24 PM 07/09/2022    2:30 PM 06/02/2022   10:03 AM 05/27/2021    4:14 PM 09/02/2018    2:47 PM  Depression screen PHQ 2/9  Decreased Interest 0 0 0 0 0  Down, Depressed, Hopeless 0 0 0 0 0  PHQ - 2 Score 0 0 0 0 0     Review of Systems:   Pertinent items are noted in HPI  Denies any vaginal bleeding, vaginal discharge, increased urinary frequency Pertinent History Reviewed:  Reviewed past medical,surgical, social and family history.   Reviewed problem list, medications and allergies. Physical Assessment:   Vitals:   08/04/23 1352  BP: 93/73  Pulse: 77  Weight: 227 lb 6.4 oz (103.1 kg)  Height: 5' 6.5" (1.689 m)  Body mass index is 36.15 kg/m.        Physical Examination:   General appearance - well appearing, and in no distress  Mental status - alert, oriented to person, place, and time  Psych:  She has a normal mood and affect  Skin - warm and dry, normal color, no suspicious lesions noted  Chest - effort normal, all lung fields clear to auscultation bilaterally  Heart - normal rate and regular rhythm  Neck:  midline trachea, no thyromegaly or nodules  Breasts - breasts appear normal, no suspicious masses, no skin or nipple changes or   axillary nodes  Abdomen - soft, nontender, nondistended, no masses or organomegaly  Pelvic - VULVA: normal appearing vulva with no masses, tenderness or lesions    VAGINA: normal appearing vagina with normal color and discharge, no lesions    CERVIX: normal appearing cervix without discharge or lesions, no CMT  Thin prep pap is  obtained today  UTERUS: uterus is felt to be normal size, shape, consistency and nontender   ADNEXA: No adnexal masses or tenderness noted.  Rectal - normal rectal, good sphincter tone, no masses felt.  Extremities:  No swelling or varicosities noted  Chaperone present for exam, Hendricks Milo, CMA Assessment & Plan:  1. Encntr for gyn exam (general) (routine) w/o abn findings (Primary) - Pap smear obtained today - Mammogram 01/2023 - Colonoscopy 2021 - Bone mineral density 2023.  She is taking 5000I.U. Vit D and calcium - lab work done with PCP, Dr. Thornell Mule - vaccines reviewed/updated  2. Cervical cancer screening - Cytology - PAP( [place]) - PR OBTAINING SCREEN PAP SMEAR  3. Family history of breast cancer - doing breast MRIs yearly  4. Controlled type 2 diabetes mellitus without complication, without long-term current use of insulin (HCC) - newly on [place]  5. Family history of ovarian cancer - did have genetic testing   Meds: No orders of the defined  types were placed in this encounter.   Follow-up: Return in about 1 year (around 08/03/2024).  Jerene Bears, MD 08/04/2023 2:36 PM

## 2023-08-10 ENCOUNTER — Encounter (HOSPITAL_BASED_OUTPATIENT_CLINIC_OR_DEPARTMENT_OTHER): Payer: Self-pay | Admitting: Obstetrics & Gynecology

## 2023-08-10 LAB — CYTOLOGY - PAP
Comment: NEGATIVE
High risk HPV: NEGATIVE

## 2023-10-19 ENCOUNTER — Encounter (HOSPITAL_BASED_OUTPATIENT_CLINIC_OR_DEPARTMENT_OTHER): Payer: Self-pay | Admitting: Obstetrics & Gynecology

## 2023-10-19 ENCOUNTER — Other Ambulatory Visit (HOSPITAL_BASED_OUTPATIENT_CLINIC_OR_DEPARTMENT_OTHER): Payer: Self-pay | Admitting: Obstetrics & Gynecology

## 2023-10-19 DIAGNOSIS — E039 Hypothyroidism, unspecified: Secondary | ICD-10-CM

## 2023-10-19 MED ORDER — LEVOTHYROXINE SODIUM 50 MCG PO TABS: 50.0000 ug | ORAL_TABLET | Freq: Every day | ORAL | 2 refills | Status: AC

## 2023-11-04 ENCOUNTER — Encounter (HOSPITAL_BASED_OUTPATIENT_CLINIC_OR_DEPARTMENT_OTHER): Payer: Self-pay | Admitting: Obstetrics & Gynecology

## 2023-11-04 ENCOUNTER — Ambulatory Visit (HOSPITAL_BASED_OUTPATIENT_CLINIC_OR_DEPARTMENT_OTHER): Admitting: Obstetrics & Gynecology

## 2023-11-04 ENCOUNTER — Other Ambulatory Visit (HOSPITAL_COMMUNITY)
Admission: RE | Admit: 2023-11-04 | Discharge: 2023-11-04 | Disposition: A | Discharge status: Home / Self Care | Source: Ambulatory Visit | Attending: Obstetrics & Gynecology | Admitting: Obstetrics & Gynecology

## 2023-11-04 VITALS — BP 119/58 | HR 85 | Wt 219.6 lb

## 2023-11-04 DIAGNOSIS — R87618 Other abnormal cytological findings on specimens from cervix uteri: Secondary | ICD-10-CM | POA: Diagnosis present

## 2023-11-04 NOTE — Progress Notes (Signed)
 GYNECOLOGY  VISIT  CC:   repeat Pap smear  HPI: 73 y.o. G3P0012 Married White or Caucasian female here for repeat pap smear due to atrophic pattern with atypical atypia noted on pap smear on 08/04/2023.  HR HPV was negative.  Vaginal estrogen cream suggested for use but she did not want to use any estrogens so she has been using replens vaginal moisturizer.  Repeat pap smear planned today.   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   Diabetes mellitus without complication [facility])    Family history of breast cancer    Family history of colon cancer    Family history of ovarian cancer    Fracture of knee region 10/2014   Bilateral    Gastric polyp    Gastroparesis    GERD (gastroesophageal reflux disease)    Hepatic steatosis 05/09/2019   By ultrasound 04/2019   Hiatal hernia    History of kidney stones    Hypertension    Hypothyroidism    Irritable bowel syndrome    Lactose intolerance    RSD (reflex sympathetic dystrophy)     MEDS:   Current Outpatient Medications on File Prior to Visit  Medication Sig Dispense Refill   aspirin  EC 81 MG tablet Take 81 mg by mouth daily. Swallow whole.     Calcium -Magnesium-Vitamin D (CALCIUM  MAGNESIUM PO) Take 1,500 mg by mouth daily.      Cholecalciferol  (VITAMIN D-3) 5000 units TABS Take 5,000 Units by mouth daily.     cyanocobalamin  (,VITAMIN B-12,) 1000 MCG/ML injection INJECT 1 ML INTRAMUSCULARLY ONCE EVERY MONTH 3 mL 0   cyclobenzaprine  (FLEXERIL ) 5 MG tablet Take 1 tablet (5 mg total) by mouth 3 (three) times daily as needed for muscle spasms. 30 tablet 0   diclofenac  Sodium (VOLTAREN ) 1 % GEL Apply 2 g topically 4 (four) times daily as needed (pain).     Eszopiclone  3 MG TABS Take 3 mg by mouth at bedtime. Take immediately before bedtime     fluticasone  (FLONASE ) 50 MCG/ACT nasal spray  PLACE TWO SPRAYS INTO THE NOSE DAILY 16 g 5   levothyroxine  (SYNTHROID ) 50 MCG tablet Take 1 tablet (50 mcg total) by mouth daily before breakfast. 90 tablet 2   lisinopril -hydrochlorothiazide  (ZESTORETIC ) 20-25 MG tablet TAKE 1 TABLET DAILY 90 tablet 3   oxycodone  (OXY-IR) 5 MG capsule Take 10 mg by mouth every 4 (four) hours as needed for pain.     pantoprazole  (PROTONIX ) 40 MG tablet TAKE 1 TABLET DAILY 90 tablet 3   promethazine  (PHENERGAN ) 25 MG suppository Place 1 suppository (25 mg total) rectally every 6 (six) hours as needed for nausea or vomiting. 12 each 2   SYRINGE-NEEDLE, DISP, 3 ML (B-D 3CC LUER-LOK SYR 25GX1) 25G X 1 3 ML MISC USE TO INJECT VITAMIN B12 EVERY 30 DAYS 12 each 2   triamcinolone  cream (KENALOG ) 0.5 % Apply 1 application topically 3 (three) times daily as needed (eczema).     No current facility-administered medications on file prior to visit.    ALLERGIES: Morphine and codeine and Sulfa antibiotics  SH:  married, non smoker  Review of Systems  Constitutional: Negative.   Genitourinary: Negative.     PHYSICAL EXAMINATION:    BP (!) 119/58 (BP Location: Left Arm, Patient Position: Sitting,  Cuff Size: Large)   Pulse 85   Wt 219 lb 9.6 oz (99.6 kg)   LMP 05/05/1996   SpO2 97%   BMI 34.91 kg/m     General appearance: alert, cooperative and appears stated age  Lymph:  no inguinal LAD noted Pelvic: External genitalia:  no lesions              Urethra:  normal appearing urethra with no masses, tenderness or lesions              Bartholins and Skenes: normal                 Vagina: normal mucosa without prolapse or lesions              Cervix: no lesions             Pap obtained  Chaperone was present for exam.  Assessment/Plan: 1. Other abnormal cytological finding of specimen from cervix (Primary) - repeat pap smear obtained today - Cytology - PAP( [place])

## 2023-11-10 LAB — CYTOLOGY - PAP: Diagnosis: NEGATIVE

## 2023-11-11 ENCOUNTER — Ambulatory Visit (HOSPITAL_BASED_OUTPATIENT_CLINIC_OR_DEPARTMENT_OTHER): Payer: Self-pay | Admitting: Obstetrics & Gynecology

## 2023-11-17 ENCOUNTER — Telehealth (HOSPITAL_BASED_OUTPATIENT_CLINIC_OR_DEPARTMENT_OTHER): Payer: Self-pay

## 2023-11-17 NOTE — Telephone Encounter (Signed)
-----   Message -----  From: Cleotilde Ronal RAMAN, MD  Sent: 11/16/2023  12:34 PM EDT  To: [facility]  Subject: FW: Notification of Unviewed Test Results      Called patient. DOB verified. Informed patient of results and recommendations. Patient states she saw it on her MyChart but appreciates the call. Tbw   Please let pt know her follow up pap smear was normal.  She is on my chart but has not reviewed her results or the note I sent.  This was a follow up for prior abnormal so I just want her to know it was all good.  Thanks.  ----- Message -----  From: Cleotilde Ronal RAMAN, MD  Sent: 11/14/2023   9:49 AM EDT  To: [facility]  Subject: FW: Notification of Unviewed Test Results      Please let pt know her follow up pap smear was normal.  She is on my chart but has not reviewed her results or the note I sent.  This was a follow up for prior abnormal so I just want her to know it was all good.  Thanks.   Dr. Cleotilde  ----- Message -----  From: Mychart, Generic  Sent: 11/14/2023  12:15 AM EDT  To: Ronal RAMAN Cleotilde, MD  Subject: Notification of Unviewed Test Results          Wilmes, Man J has not viewed the following results:  - CYTOLOGY - PAP

## 2024-01-28 ENCOUNTER — Other Ambulatory Visit: Payer: Self-pay | Admitting: Obstetrics & Gynecology

## 2024-01-28 DIAGNOSIS — Z1231 Encounter for screening mammogram for malignant neoplasm of breast: Secondary | ICD-10-CM

## 2024-02-08 ENCOUNTER — Ambulatory Visit
Admission: RE | Admit: 2024-02-08 | Discharge: 2024-02-08 | Disposition: A | Source: Ambulatory Visit | Attending: Obstetrics & Gynecology | Admitting: Obstetrics & Gynecology

## 2024-02-08 DIAGNOSIS — Z1231 Encounter for screening mammogram for malignant neoplasm of breast: Secondary | ICD-10-CM

## 2024-02-11 ENCOUNTER — Other Ambulatory Visit: Payer: Self-pay | Admitting: Obstetrics & Gynecology

## 2024-02-11 DIAGNOSIS — R928 Other abnormal and inconclusive findings on diagnostic imaging of breast: Secondary | ICD-10-CM

## 2024-02-18 ENCOUNTER — Other Ambulatory Visit: Payer: Self-pay | Admitting: Obstetrics & Gynecology

## 2024-02-18 ENCOUNTER — Encounter (HOSPITAL_BASED_OUTPATIENT_CLINIC_OR_DEPARTMENT_OTHER): Payer: Self-pay | Admitting: Obstetrics & Gynecology

## 2024-02-18 ENCOUNTER — Ambulatory Visit
Admission: RE | Admit: 2024-02-18 | Discharge: 2024-02-18 | Disposition: A | Discharge status: Home / Self Care | Source: Ambulatory Visit | Attending: Obstetrics & Gynecology | Admitting: Obstetrics & Gynecology

## 2024-02-18 DIAGNOSIS — R928 Other abnormal and inconclusive findings on diagnostic imaging of breast: Secondary | ICD-10-CM

## 2024-02-25 ENCOUNTER — Ambulatory Visit
Admission: RE | Admit: 2024-02-25 | Discharge: 2024-02-25 | Disposition: A | Source: Ambulatory Visit | Attending: Obstetrics & Gynecology | Admitting: Obstetrics & Gynecology

## 2024-02-25 ENCOUNTER — Other Ambulatory Visit: Payer: Self-pay | Admitting: Obstetrics & Gynecology

## 2024-02-25 ENCOUNTER — Ambulatory Visit
Admission: RE | Admit: 2024-02-25 | Discharge: 2024-02-25 | Disposition: A | Discharge status: Home / Self Care | Source: Ambulatory Visit | Attending: Obstetrics & Gynecology | Admitting: Obstetrics & Gynecology

## 2024-02-25 DIAGNOSIS — R928 Other abnormal and inconclusive findings on diagnostic imaging of breast: Secondary | ICD-10-CM

## 2024-02-25 HISTORY — PX: BREAST BIOPSY: SHX20

## 2024-02-26 LAB — SURGICAL PATHOLOGY

## 2024-05-19 ENCOUNTER — Encounter: Payer: Self-pay | Admitting: Gastroenterology

## 2024-07-05 ENCOUNTER — Ambulatory Visit: Admitting: Gastroenterology

## 2024-08-05 ENCOUNTER — Ambulatory Visit (HOSPITAL_BASED_OUTPATIENT_CLINIC_OR_DEPARTMENT_OTHER): Admitting: Obstetrics & Gynecology
